# Patient Record
Sex: Male | Born: 1950 | Race: White | Hispanic: No | Smoking: Former smoker
Health system: Southern US, Community
[De-identification: ages and names within clinical notes are randomized; demographics above are authoritative.]

## PROBLEM LIST (undated history)

## (undated) DIAGNOSIS — M549 Dorsalgia, unspecified: Secondary | ICD-10-CM

## (undated) DIAGNOSIS — Z87442 Personal history of urinary calculi: Secondary | ICD-10-CM

## (undated) DIAGNOSIS — N200 Calculus of kidney: Secondary | ICD-10-CM

## (undated) DIAGNOSIS — I2510 Atherosclerotic heart disease of native coronary artery without angina pectoris: Secondary | ICD-10-CM

## (undated) DIAGNOSIS — I480 Paroxysmal atrial fibrillation: Secondary | ICD-10-CM

## (undated) DIAGNOSIS — I959 Hypotension, unspecified: Secondary | ICD-10-CM

## (undated) DIAGNOSIS — R55 Syncope and collapse: Secondary | ICD-10-CM

## (undated) DIAGNOSIS — F419 Anxiety disorder, unspecified: Secondary | ICD-10-CM

## (undated) HISTORY — PX: LITHOTRIPSY: SUR834

## (undated) HISTORY — DX: Paroxysmal atrial fibrillation: I48.0

## (undated) HISTORY — DX: Hypotension, unspecified: I95.9

## (undated) HISTORY — PX: OTHER SURGICAL HISTORY: SHX169

## (undated) HISTORY — DX: Syncope and collapse: R55

## (undated) HISTORY — PX: CARDIAC CATHETERIZATION: SHX172

---

## 2004-11-19 ENCOUNTER — Ambulatory Visit: Payer: Self-pay | Admitting: Family Medicine

## 2005-07-11 ENCOUNTER — Ambulatory Visit: Payer: Self-pay | Admitting: Family Medicine

## 2005-07-25 ENCOUNTER — Ambulatory Visit: Payer: Self-pay | Admitting: Family Medicine

## 2005-08-14 ENCOUNTER — Ambulatory Visit (HOSPITAL_COMMUNITY): Payer: Self-pay | Admitting: Psychiatry

## 2005-09-04 ENCOUNTER — Ambulatory Visit: Payer: Self-pay | Admitting: Family Medicine

## 2005-09-04 ENCOUNTER — Ambulatory Visit (HOSPITAL_COMMUNITY): Payer: Self-pay | Admitting: Psychiatry

## 2005-09-25 ENCOUNTER — Ambulatory Visit (HOSPITAL_COMMUNITY): Payer: Self-pay | Admitting: Psychiatry

## 2005-11-01 ENCOUNTER — Ambulatory Visit (HOSPITAL_COMMUNITY): Payer: Self-pay | Admitting: Psychiatry

## 2005-12-02 ENCOUNTER — Ambulatory Visit (HOSPITAL_COMMUNITY): Payer: Self-pay | Admitting: Psychiatry

## 2005-12-02 ENCOUNTER — Ambulatory Visit: Payer: Self-pay | Admitting: Family Medicine

## 2005-12-09 ENCOUNTER — Ambulatory Visit: Payer: Self-pay | Admitting: Family Medicine

## 2005-12-30 ENCOUNTER — Ambulatory Visit (HOSPITAL_COMMUNITY): Payer: Self-pay | Admitting: Psychiatry

## 2006-01-15 ENCOUNTER — Inpatient Hospital Stay (HOSPITAL_COMMUNITY): Admission: EM | Admit: 2006-01-15 | Discharge: 2006-01-22 | Payer: Self-pay | Admitting: Emergency Medicine

## 2006-01-15 ENCOUNTER — Ambulatory Visit: Payer: Self-pay | Admitting: Family Medicine

## 2006-01-15 ENCOUNTER — Ambulatory Visit: Payer: Self-pay | Admitting: Internal Medicine

## 2006-01-15 ENCOUNTER — Ambulatory Visit (HOSPITAL_COMMUNITY): Payer: Self-pay | Admitting: Psychiatry

## 2006-01-28 ENCOUNTER — Ambulatory Visit: Payer: Self-pay | Admitting: Family Medicine

## 2006-02-03 ENCOUNTER — Ambulatory Visit (HOSPITAL_COMMUNITY): Payer: Self-pay | Admitting: Psychiatry

## 2006-02-06 ENCOUNTER — Ambulatory Visit: Payer: Self-pay | Admitting: Family Medicine

## 2006-02-10 ENCOUNTER — Ambulatory Visit: Payer: Self-pay

## 2006-02-13 ENCOUNTER — Ambulatory Visit: Payer: Self-pay | Admitting: Family Medicine

## 2006-02-14 ENCOUNTER — Ambulatory Visit: Payer: Self-pay | Admitting: Pulmonary Disease

## 2006-02-24 ENCOUNTER — Ambulatory Visit (HOSPITAL_COMMUNITY): Payer: Self-pay | Admitting: Psychiatry

## 2006-02-27 ENCOUNTER — Ambulatory Visit: Payer: Self-pay | Admitting: Internal Medicine

## 2006-02-27 ENCOUNTER — Ambulatory Visit: Payer: Self-pay

## 2006-02-27 ENCOUNTER — Encounter: Payer: Self-pay | Admitting: Internal Medicine

## 2006-03-28 ENCOUNTER — Ambulatory Visit: Payer: Self-pay | Admitting: Gastroenterology

## 2006-03-31 ENCOUNTER — Encounter (INDEPENDENT_AMBULATORY_CARE_PROVIDER_SITE_OTHER): Payer: Self-pay | Admitting: Specialist

## 2006-03-31 ENCOUNTER — Ambulatory Visit: Payer: Self-pay | Admitting: Gastroenterology

## 2006-04-28 ENCOUNTER — Ambulatory Visit (HOSPITAL_COMMUNITY): Payer: Self-pay | Admitting: Psychiatry

## 2006-06-20 ENCOUNTER — Ambulatory Visit: Payer: Self-pay | Admitting: Family Medicine

## 2006-06-23 ENCOUNTER — Ambulatory Visit (HOSPITAL_COMMUNITY): Payer: Self-pay | Admitting: Psychiatry

## 2006-11-26 ENCOUNTER — Ambulatory Visit (HOSPITAL_COMMUNITY): Payer: Self-pay | Admitting: Psychiatry

## 2007-01-02 ENCOUNTER — Ambulatory Visit (HOSPITAL_COMMUNITY): Payer: Self-pay | Admitting: Psychiatry

## 2007-01-16 ENCOUNTER — Ambulatory Visit (HOSPITAL_COMMUNITY): Payer: Self-pay | Admitting: Psychiatry

## 2007-01-19 ENCOUNTER — Ambulatory Visit (HOSPITAL_COMMUNITY): Payer: Self-pay | Admitting: Psychiatry

## 2007-02-06 ENCOUNTER — Ambulatory Visit (HOSPITAL_COMMUNITY): Payer: Self-pay | Admitting: Psychiatry

## 2007-03-20 ENCOUNTER — Ambulatory Visit (HOSPITAL_COMMUNITY): Payer: Self-pay | Admitting: Psychiatry

## 2007-04-03 ENCOUNTER — Ambulatory Visit: Payer: Self-pay | Admitting: Family Medicine

## 2007-04-03 LAB — CONVERTED CEMR LAB
AST: 24 units/L (ref 0–37)
Alkaline Phosphatase: 70 units/L (ref 39–117)
Calcium: 10 mg/dL (ref 8.4–10.5)
Chloride: 100 meq/L (ref 96–112)
Cholesterol: 315 mg/dL (ref 0–200)
Creatinine, Ser: 1 mg/dL (ref 0.4–1.5)
Eosinophils Absolute: 0.1 10*3/uL (ref 0.0–0.6)
Eosinophils Relative: 1.5 % (ref 0.0–5.0)
GFR calc Af Amer: 99 mL/min
GFR calc non Af Amer: 82 mL/min
Glucose, Bld: 77 mg/dL (ref 70–99)
HDL: 47.3 mg/dL (ref >39.0)
Hemoglobin: 15.6 g/dL (ref 13.0–17.0)
Neutro Abs: 5.1 10*3/uL (ref 1.4–7.7)
Platelets: 261 10*3/uL (ref 150–400)
RDW: 11.9 % (ref 11.5–14.6)
Total Bilirubin: 1.7 mg/dL — ABNORMAL HIGH (ref 0.3–1.2)
Total Protein: 7.1 g/dL (ref 6.0–8.3)
Triglycerides: 52 mg/dL (ref 0–149)
WBC: 7.7 10*3/uL (ref 4.5–10.5)

## 2007-04-06 ENCOUNTER — Emergency Department (HOSPITAL_COMMUNITY): Admission: EM | Admit: 2007-04-06 | Discharge: 2007-04-07 | Payer: Self-pay | Admitting: Emergency Medicine

## 2007-04-13 ENCOUNTER — Ambulatory Visit: Payer: Self-pay | Admitting: Family Medicine

## 2007-04-21 DIAGNOSIS — E785 Hyperlipidemia, unspecified: Secondary | ICD-10-CM | POA: Insufficient documentation

## 2007-04-21 DIAGNOSIS — R51 Headache: Secondary | ICD-10-CM

## 2007-04-21 DIAGNOSIS — F329 Major depressive disorder, single episode, unspecified: Secondary | ICD-10-CM

## 2007-04-21 DIAGNOSIS — R519 Headache, unspecified: Secondary | ICD-10-CM | POA: Insufficient documentation

## 2007-04-21 DIAGNOSIS — F411 Generalized anxiety disorder: Secondary | ICD-10-CM | POA: Insufficient documentation

## 2007-05-01 ENCOUNTER — Ambulatory Visit (HOSPITAL_COMMUNITY): Payer: Self-pay | Admitting: Psychiatry

## 2007-06-01 ENCOUNTER — Ambulatory Visit (HOSPITAL_COMMUNITY): Payer: Self-pay | Admitting: Psychiatry

## 2007-07-06 ENCOUNTER — Ambulatory Visit (HOSPITAL_COMMUNITY): Payer: Self-pay | Admitting: Psychiatry

## 2007-08-24 ENCOUNTER — Ambulatory Visit (HOSPITAL_COMMUNITY): Payer: Self-pay | Admitting: Psychiatry

## 2007-10-19 ENCOUNTER — Ambulatory Visit (HOSPITAL_COMMUNITY): Payer: Self-pay | Admitting: Psychiatry

## 2008-01-11 ENCOUNTER — Ambulatory Visit (HOSPITAL_COMMUNITY): Payer: Self-pay | Admitting: Psychiatry

## 2008-04-11 ENCOUNTER — Ambulatory Visit (HOSPITAL_COMMUNITY): Payer: Self-pay | Admitting: Psychiatry

## 2008-07-18 ENCOUNTER — Ambulatory Visit (HOSPITAL_COMMUNITY): Payer: Self-pay | Admitting: Psychiatry

## 2008-09-21 ENCOUNTER — Emergency Department (HOSPITAL_COMMUNITY): Admission: EM | Admit: 2008-09-21 | Discharge: 2008-09-21 | Payer: Self-pay | Admitting: Podiatry

## 2009-02-03 ENCOUNTER — Encounter (INDEPENDENT_AMBULATORY_CARE_PROVIDER_SITE_OTHER): Payer: Self-pay | Admitting: *Deleted

## 2010-11-17 ENCOUNTER — Emergency Department (HOSPITAL_COMMUNITY): Payer: Medicare Other

## 2010-11-17 ENCOUNTER — Emergency Department (HOSPITAL_COMMUNITY)
Admission: EM | Admit: 2010-11-17 | Discharge: 2010-11-17 | Disposition: A | Discharge status: Home / Self Care | Payer: Medicare Other | Attending: Emergency Medicine | Admitting: Emergency Medicine

## 2010-11-17 DIAGNOSIS — F341 Dysthymic disorder: Secondary | ICD-10-CM | POA: Insufficient documentation

## 2010-11-17 DIAGNOSIS — Z79899 Other long term (current) drug therapy: Secondary | ICD-10-CM | POA: Insufficient documentation

## 2010-11-17 DIAGNOSIS — N201 Calculus of ureter: Secondary | ICD-10-CM | POA: Insufficient documentation

## 2010-11-17 DIAGNOSIS — R109 Unspecified abdominal pain: Secondary | ICD-10-CM | POA: Insufficient documentation

## 2010-11-17 LAB — BASIC METABOLIC PANEL
BUN: 14 mg/dL (ref 6–23)
Calcium: 9.2 mg/dL (ref 8.4–10.5)
Chloride: 106 mEq/L (ref 96–112)
Creatinine, Ser: 1.2 mg/dL (ref 0.4–1.5)
GFR calc non Af Amer: >60 mL/min (ref >60)
Glucose, Bld: 78 mg/dL (ref 70–99)
Potassium: 3.4 mEq/L — ABNORMAL LOW (ref 3.5–5.1)

## 2011-02-19 NOTE — Assessment & Plan Note (Signed)
[facility] OFFICE NOTE   Jeremy King, Jeremy King                       MRN:          [id number]  DATE:04/13/2007                            DOB:          03/12/1951    This is a 60 year old gentleman here for a complete physical  examination.  In general, he is doing well.  His eczema has acted up  again on his lower legs.  He has been using the over-the-counter  cortisone cream, but says it is not working very well.  He is just  finishing a course of Cipro for a prostate infection.  This was  asymptomatic, and he continues to have no symptoms at all.  He continues  to see Dr. Lolly Mustache on a regular basis for depression and anxiety.  He had  an unremarkable colonoscopy in June 2007.   Further details of his past medical history, family history, social  history, habits, etc., refer to our last physical note dated December 11, 2005.   ALLERGIES:  ZOLOFT.   CURRENT MEDICATIONS:  1. Effexor XR a total of 225 mg a day.  2. Multivitamins daily.  3. Fish oil daily.  4. Trazodone 100 mg nightly.  5. Xanax 1 mg t.i.d.   OBJECTIVE:  Weight 177.  BP 98/64.  Pulse 80 and regular.  GENERAL:  He appears to be doing well.  SKIN:  Exam is remarkable for several small erythematous scaly patches  of skin on his legs.  EYES clear.  EARS clear.  PHARYNX clear.  NECK:  Supple without lymphadenopathy or masses.  LUNGS:  Clear.  NECK:  Supple without lymphadenopathy or masses.  CARDIAC:  Rate and rhythm regular without gallops, murmurs, or rubs.  Distal pulses full.  EKG:  Shows sinus rhythm with a right bundle branch block as usual.  ABDOMEN:  Soft.  Normal bowel sounds.  Nontender.  No masses.  GENITALIA:  Normal male.  RECTAL EXAM:  No masses or tenderness.  Prostate is within normal  limits.  It is nontender.  Stool is Hemoccult negative.  EXTREMITIES:  No clubbing, cyanosis, or edema.  NEUROLOGIC:  Exam is grossly intact.   He was here for fasting labs on June 27th.  These were all within normal  limits except for his lipid panel.  Total cholesterol was high at 315.  HDL was adequate at 47, but LDL was quite high at 248.  PSA was normal  at 3.01.   ASSESSMENT AND PLAN:  1. Complete physical exam.  We discussed getting more regular      exercise.  2. Hyperlipidemia.  We will get back on Zocor 40 mg once a day.  We      will recheck his blood work in 6 months.  3. Eczema.  Diprolene AF cream to apply b.i.d. as needed.  4. Depression and anxiety.  He will follow up with Dr. Lolly Mustache.  5. Recent prostatitis.  He will finish out his current course of      Cipro.     Tera Mater. Clent Ridges, MD  Electronically Signed  SAF/MedQ  DD: 04/14/2007  DT: 04/14/2007  Job #: [id number]

## 2011-02-22 NOTE — Op Note (Signed)
NAMEDARTANYON, FRANKOWSKI                ACCOUNT NO.:  [id number]   MEDICAL RECORD NO.:  [id number]          PATIENT TYPE:  INP   LOCATION:  1313                         FACILITY:  [facility]   PHYSICIAN:  Georgina Quint. Plotnikov, M.D. LHCDATE OF BIRTH:  11/26/1950   DATE OF PROCEDURE:  01/21/2006  DATE OF DISCHARGE:                                 OPERATIVE REPORT   PROCEDURE:  Laceration repair.   INDICATIONS:  Open wound, 6 cm, right arm, status post fall.   Risks including infection, scar formation, bleeding and others, were  explained to the patient in detail and he agreed to proceed.  A 6 cm  laceration with a 4 cm gap in the middle was prepped with Betadine and  alcohol and cleaned after injection from the inside edges with 10 mL of 2%  lidocaine with epinephrine.  Six staples were applied with approximation of  the wound edges.  Antibiotic ointment applied, Telfa pad with dressing.  Tolerated it well.   COMPLICATIONS:  None.   FOLLOW-UP:  Change Telfa pad with antibiotic ointment daily.  Remove staples  in about 12 days.           ______________________________  Georgina Quint Plotnikov, M.D. [facility]     AVP/MEDQ  D:  01/21/2006  T:  01/22/2006  Job:  [id number]

## 2011-02-22 NOTE — H&P (Signed)
Jeremy King, Jeremy King                ACCOUNT NO.:  [id number]   MEDICAL RECORD NO.:  [id number]          PATIENT TYPE:  INP   LOCATION:  0103                         FACILITY:  [facility]   PHYSICIAN:  Valetta Mole. Swords, M.D. [facility] OF BIRTH:  [birth year]   DATE OF ADMISSION:  01/15/2006  DATE OF DISCHARGE:                                HISTORY & PHYSICAL   CHIEF COMPLAINT:  Altered mental status.   HISTORY OF PRESENT ILLNESS:  Jeremy King (date of birth [birth year]) is a 60 year old male with a long history of anxiety and depression,  who had a three to four day  history of altered mental status associated  with hallucination, mostly visual, and also associated with increased  fatigue and lifelessness. He apparently was seen on admission today with  altered mental status, thought it was psychiatric in nature, saw his  psychiatrist shortly thereafter. The psychiatrist thought Jeremy King was  more ill than could be explained by psychiatric illness and was sent to the  emergency room for evaluation. Emergency department evaluation has revealed  the patient likely has pneumonia and he is admitted for further evaluation  and treatment. On further questioning Jeremy King does admit to some  shortness of breath that he says is typical of his panic attacks and felt  that his shortness of breath was related to his panic attacks. He has  chronic wheezing and a chronic cough not significantly changed. He has had  some chills, but nothing of significance and he denies any known fever or  sweats. He does admit to decreased appetite, increased panic, increased  crying. His wife states that Jeremy King appears to be more unsteady on his  feet.   REVIEW OF SYSTEMS:  Increased panic, increased emotional displays, decreased  appetite. It is worth noted that three weeks ago that the patient had some  lower extremity edema, cause unknown and was treated with Lasix, and is  using Lasix p.r.n. with  relative control of his edema.   PAST MEDICAL HISTORY:  Panic and depression. He also has hyperlipidemia.   PAST SURGICAL HISTORY:  Rotator cuff.   CURRENT MEDICATIONS:  1.  Trazodone 75 mg q.h.s.  2.  Zocor 40 mg p.o. daily.  3.  Lasix 20 mg p.o. daily p.r.n.  4.  Effexor XR 150mg  p.o. daily.   ALLERGIES:  ZOLOFT.   SOCIAL HISTORY:  He smokes two packs per day. He is married, lives with his  wife, does not drink alcohol.   FAMILY HISTORY:  Mother deceased with what sounds congestive heart failure  and valvular disorder at 14. Father deceased with myocardial infarction in  his early 58s.   REVIEW OF SYSTEMS:  As above. No other complaints on a complete review of  systems.   PHYSICAL EXAMINATION:  VITAL SIGNS: On examination temperature 100.2, pulse  91, respirations 19, blood pressure 106/61.  GENERAL: He appears as a disheveled male in no acute distress.  HEENT: Normocephalic and atraumatic. Extraocular muscles are intact.  Oropharynx is pink, somewhat dry.  NECK: Supple without lymphadenopathy, thyromegaly, jugular venous  distention, or carotid bruits.  CHEST: Decreased breath sounds throughout with inspiratory and expiratory  wheezing. Dullness to percussion at both bases. Wheezing is heard anteriorly  and posteriorly.  CARDIAC: S1 and S2 regular. A 2/6 holosystolic murmur.  ABDOMEN: Active bowel sounds, soft, and nontender. There is no  hepatosplenomegaly. No masses palpated.  EXTREMITIES: There is no clubbing, cyanosis. There is trace pretibial edema.  NEUROLOGIC: He is alert and oriented, and moves all four extremities without  difficulty.  RECTAL EXAM: Not performed.   LABORATORY DATA:  Laboratories demonstrate a white count of 10.8, hemoglobin  12.0, platelet count 183,000. Alcohol level less than 5.  Urine drug screen  unremarkable. Blood gases reveal pH of 7.43, PCO2 35, PO2 44.6, BNP mildly  elevated at 201.  Blood cultures drawn.   Chest x-ray demonstrates  bilateral airspace disease. EKG demonstrates normal  sinus rhythm with an incomplete right bundle branch block.   ASSESSMENT:  1.  Community-acquired pneumonia.  2.  Hypoxic respiratory failure; suspect most of this chronic.  3.  Hypokalemia.  4.  Depression.  5.  Hyperlipidemia.  6.  Subjective history of edema which seems to be resolved.   PLAN:  1.  The patient needs antibiotic coverage. Will treat with Rocephin and      Zithromax. We will also treat with steroids given much bronchospasm.      Also will treat with nebulizers and oxygen. His story is somewhat odd      for community-acquired pneumonia and so some suspicion for other causes      of airspace disease should be considered if he does not respond as      typical for community-acquired pneumonia.  2.  Respiratory failure. Will use oxygen. Discontinue smoking.  3.  Nicotine addiction. Will replace with nicotine patch.  4.  Depression and anxiety. Continue his current medications.  5.  Hyperlipidemia. Continue Zocor.  6.  Subjective edema. I am really unsure what this is related to. I am      unclear as to why Lasix was prescribed. Clearly the edema is better. I      do wonder whether some how this history of edema and bilateral airspace      disease are somehow related to an inflammatory process. I will check a      sedimentation rate. If it is markedly elevated he may need some further      evaluation for an inflammatory pulmonary disease if he does not respond      to antibiotics for community-acquired pneumonia.      Bruce Rexene Edison Swords, M.D. [facility]  Electronically Signed     BHS/MEDQ  D:  01/15/2006  T:  01/15/2006  Job:  [id number]   cc:   Jeannett Senior A. Clent Ridges, M.D. [facility]  [address] [place]  [place] [address]

## 2011-02-22 NOTE — Consult Note (Signed)
[name], Jeremy King                ACCOUNT NO.:  [id number]   MEDICAL RECORD NO.:  [id number]          PATIENT TYPE:  INP   LOCATION:  1313                         FACILITY:  [facility]   PHYSICIAN:  Georgina Quint. Plotnikov, M.D. LHCDATE OF BIRTH:  05-Feb-1951   DATE OF CONSULTATION:  DATE OF DISCHARGE:                                   CONSULTATION   Audio too short to transcribe (less than 5 seconds)           ______________________________  Georgina Quint. Plotnikov, M.D. [facility]     AVP/MEDQ  D:  01/21/2006  T:  01/21/2006  Job:  [id number]

## 2011-02-22 NOTE — Discharge Summary (Signed)
Jeremy King, Jeremy King                ACCOUNT NO.:  [id number]   MEDICAL RECORD NO.:  [id number]          PATIENT TYPE:  INP   LOCATION:  1313                         [facility]ACILITY:  [facility]   PHYSICIAN:  Rene Paci, M.D. LHCDATE OF BIRTH:  [birth year]   DATE OF ADMISSION:  01/15/2006  DATE OF DISCHARGE:  01/22/2006                                 DISCHARGE SUMMARY   DISCHARGE DIAGNOSIS:  1.  Acute hypoxic respiratory failure secondary to presumed bilateral      atypical pneumonia, improving.  Continue home O2, antibiotics with close      outpatient followup.  2.  Steroid-induced hyperglycemia. Has been on sliding-scale insulin during      this hospitalization. Normal A1c. Expect resolution once off prednisone  3.  Tobacco abuse prior to admission.  Recommend continued cessation. Has      been on nicotine patch during this hospitalization.  4.  History of anxiety and depression.  Continue Effexor with outpatient      behavioral health followup as needed.  5.  Hypokalemia at time of admission, presumed secondary to diuretic Lasix      use p.r.n., resolved.  6.  Accidental fall with right arm 6 cm laceration status post staples April      17, for removal in 10-14 days with outpatient MD.   DISCHARGE MEDICATIONS:  1.  Avelox 400 mg daily times an additional 7 days.  The patient has been      treated with 7 days of IV Rocephin and azithromycin during this      hospitalization.  2.  Prednisone taper 30 mg x2 days, then 20 mg x2 days, then 10 mg x2 days,      then stop.  3.  Protonix 40 mg p.o. twice daily.  4.  The patient will continue to use Effexor XR 150 mg daily.  5.  Zocor 40 mg once nightly as prior to admission.   DISPOSITION:  The patient is being discharged home with arrangements made  for home oxygen 2 liters continuous at all times.  He has been weaned down  to 93% saturation on 1-2 liters and is not actively dyspneic or coughing at  this time. He is anxious for discharge  home, and, as he has remained stable  without decline only slow improvement, is felt stable to transition his  treatment an outpatient basis with close outpatient follow-up primary MD.  Hospital followup has been scheduled with Dr. Clent Ridges, his primary MD, next week  on Wednesday, April 25, at 11:15 a.m. The patient is instructed to return  the emergency room or call his MD sooner if fevers, shortness of breath, or  coughing, or pain should recur once home.   CONSULTATIONS THIS HOSPITALIZATION:  Include Dr. Sandrea Hughs with [facility].  Please see his consultation on April 12.   CONDITION ON DISCHARGE:  Medically improved and stable for continued  outpatient treatment of bilateral pneumonia.   HOSPITAL COURSE BY PROBLEM:  #1 HYPOXIC RESPIRATORY FAILURE:  The patient is a pleasant 60 year old  gentleman basically healthy with history of psychiatric depression and  anxiety who was brought to the emergency room the day of admission for  evaluation by behavioral health due to hallucinations and delusions. On  further evaluation by behavioral health, it was felt he was more medically  ill than could be explained by mere psychiatric illness, and on further  medical workup, he was found to be significantly hypoxic, prompting an x-ray  which showed bilateral diffuse interstitial type disease and infiltration  presumably due to community-acquired pneumonia. The patient also is a  tobacco user without history of COPD. It is unclear what his baseline O2  status has been. He was admitted for IV antibiotic therapy as he did have a  white count of 10.8 despite lack of other typical symptoms such as fever,  cough, sputum, shortness of breath, or pleurisy. Because of the markedly  elevated sed rate at 94 and possibility of there being other etiologies for  his bilateral disease, a pulmonary consult was obtained.  This was done by  Dr. Sherene Sires on April 12 who agreed that atypical pneumonia was most  likely  versus other eosinophilic pneumonia versus GOOP versus alveolar hemorrhage.  Urine antigen for strep pneumo and Legionella was checked negative.  He was  treated empirically with steroids IV Solu-Medrol for a duration, then slowly  changed to p.o. prednisone taper which he is still continuing at this time.  Also agreed with empiric continued treatment with IV Rocephin and  azithromycin over the course of the week.  The patient slowly improved, his  hypoxemia moving from 92% on 5 liters at time of admission to 92% on 1 liter  on the day of discharge.  The patient is anxious for discharge home but  still requiring oxygen.  It is felt that he should continue nasal cannula O2  for the next week while on  antibiotics. Continuing tobacco cessation and  rest at home with close outpatient followup with primary MD is scheduled for  next week.  Should there be further questions or problems, the patient is to  return to the emergency room more or less urgent basis. Outpatient referral  back to pulmonary for other evaluation to be considered. The patient is  reminded to continue with tobacco cessation as he has for this time in the  hospital , and he agrees he will try.   #2.  ACCIDENTAL FALL:  On the day prior to discharge while standing to  urinate in the restroom, the patient states he became tangled in his O2  tank, his surroundings, felt weak, and fell.  Denies syncope or loss of  consciousness. He did hit his head, and head CT was negative for any  intracranial hemorrhage or damage.  Scalp remained intact; however, he did  suffer a laceration to his right arm approximately 6 cm in length which  required staples. The patient is instructed to change his pad and antibiotic  ointment daily and remove staples in about 12 days. Defer to primary MD to  follow up on this.   #3.  HYPERGLYCEMIA:  While on high doses of steroids, the patient developed hyperglycemia. He was treated with  sliding-scale insulin and low doses of  Lantus this hospitalization. A1c was normal, and it is not believed the  patient has diabetes. These should resolve once off steroids. Outpatient  follow-up with primary MD.   #4.  PSYCHIATRIC ISSUES:  As stated above, the patient's delusions, for  which he initially sought attention, were felt to be due to hypoxemia and  acute  illness.  He has not had any other problems with this during this  hospitalization. He was continued on his home Effexor plus p.r.n. Xanax  during this hospitalization, and further outpatient followup with  psychiatric behavioral health can be pursued on an as-needed basis.   #5.  OTHER MEDICAL ISSUES:  The patient's other medical issues are as listed  above.  No change were made in his regimen except as listed.      Rene Paci, M.D. [facility]  Electronically Signed     VL/MEDQ  D:  01/22/2006  T:  01/22/2006  Job:  [id number]

## 2011-07-12 LAB — URINE MICROSCOPIC-ADD ON

## 2011-07-12 LAB — DIFFERENTIAL
Basophils Absolute: 0 10*3/uL (ref 0.0–0.1)
Basophils Relative: 0 % (ref 0–1)
Eosinophils Absolute: 0 10*3/uL (ref 0.0–0.7)
Lymphs Abs: 0.8 10*3/uL (ref 0.7–4.0)

## 2011-07-12 LAB — CBC
HCT: 46.3 % (ref 39.0–52.0)
Hemoglobin: 15.8 g/dL (ref 13.0–17.0)
MCHC: 34.2 g/dL (ref 30.0–36.0)
Platelets: 225 10*3/uL (ref 150–400)
WBC: 13.6 10*3/uL — ABNORMAL HIGH (ref 4.0–10.5)

## 2011-07-12 LAB — POCT I-STAT, CHEM 8
Calcium, Ion: 1.12 mmol/L (ref 1.12–1.32)
Creatinine, Ser: 1.2 mg/dL (ref 0.4–1.5)
Glucose, Bld: 130 mg/dL — ABNORMAL HIGH (ref 70–99)
HCT: 46 % (ref 39.0–52.0)
Sodium: 140 mEq/L (ref 135–145)

## 2011-07-12 LAB — URINALYSIS, ROUTINE W REFLEX MICROSCOPIC
Bilirubin Urine: NEGATIVE
Leukocytes, UA: NEGATIVE
Nitrite: NEGATIVE
Specific Gravity, Urine: 1.017 (ref 1.005–1.030)
Urobilinogen, UA: 0.2 mg/dL (ref 0.0–1.0)
pH: 5.5 (ref 5.0–8.0)

## 2012-06-26 ENCOUNTER — Encounter: Payer: Self-pay | Admitting: Gastroenterology

## 2013-06-10 ENCOUNTER — Emergency Department (HOSPITAL_COMMUNITY)
Admission: EM | Admit: 2013-06-10 | Discharge: 2013-06-10 | Disposition: A | Discharge status: Home / Self Care | Payer: Medicare Other | Attending: Emergency Medicine | Admitting: Emergency Medicine

## 2013-06-10 ENCOUNTER — Encounter (HOSPITAL_COMMUNITY): Payer: Self-pay | Admitting: Emergency Medicine

## 2013-06-10 DIAGNOSIS — R6889 Other general symptoms and signs: Secondary | ICD-10-CM | POA: Insufficient documentation

## 2013-06-10 DIAGNOSIS — Z8679 Personal history of other diseases of the circulatory system: Secondary | ICD-10-CM | POA: Insufficient documentation

## 2013-06-10 DIAGNOSIS — F17200 Nicotine dependence, unspecified, uncomplicated: Secondary | ICD-10-CM | POA: Insufficient documentation

## 2013-06-10 DIAGNOSIS — Y939 Activity, unspecified: Secondary | ICD-10-CM | POA: Insufficient documentation

## 2013-06-10 DIAGNOSIS — Y929 Unspecified place or not applicable: Secondary | ICD-10-CM | POA: Insufficient documentation

## 2013-06-10 DIAGNOSIS — H409 Unspecified glaucoma: Secondary | ICD-10-CM | POA: Insufficient documentation

## 2013-06-10 DIAGNOSIS — H579 Unspecified disorder of eye and adnexa: Secondary | ICD-10-CM

## 2013-06-10 DIAGNOSIS — H40053 Ocular hypertension, bilateral: Secondary | ICD-10-CM

## 2013-06-10 DIAGNOSIS — T1590XA Foreign body on external eye, part unspecified, unspecified eye, initial encounter: Secondary | ICD-10-CM | POA: Insufficient documentation

## 2013-06-10 DIAGNOSIS — Z87442 Personal history of urinary calculi: Secondary | ICD-10-CM | POA: Insufficient documentation

## 2013-06-10 HISTORY — DX: Calculus of kidney: N20.0

## 2013-06-10 HISTORY — DX: Dorsalgia, unspecified: M54.9

## 2013-06-10 MED ORDER — ERYTHROMYCIN 5 MG/GM OP OINT: TOPICAL_OINTMENT | Freq: Four times a day (QID) | OPHTHALMIC | Status: DC

## 2013-06-10 MED ORDER — TETRACAINE HCL 0.5 % OP SOLN
2.0000 [drp] | Freq: Once | OPHTHALMIC | Status: AC
  Administered 2013-06-10: 2 [drp] via OPHTHALMIC
  Filled 2013-06-10: qty 2

## 2013-06-10 NOTE — ED Notes (Signed)
Redness and pain in r/eye for one month. Unresponsive to eye drops

## 2013-06-10 NOTE — ED Notes (Signed)
Corrected with glasses

## 2013-06-10 NOTE — ED Provider Notes (Signed)
CSN: [id number]     Arrival date & time 06/10/13  1426 History  This chart was scribed for non-physician practitioner working with Jeremy King. Oletta Lamas, MD by Ashley Jacobs, ED scribe. This patient was seen in room WTR8/WTR8 and the patient's care was started at 4:24 PM    Chief Complaint  Patient presents with  . Foreign Body in Eye   Patient is a 62 y.o. male presenting with foreign body in eye. The history is provided by the patient and medical records. No language interpreter was used.  Foreign Body in Eye This is a recurrent problem. The current episode started more than 1 week ago. The problem occurs constantly. The problem has been gradually worsening. Nothing aggravates the symptoms. Nothing relieves the symptoms.  Foreign Body in Eye This is a recurrent problem. The current episode started more than 1 week ago. The problem occurs constantly. The problem has been gradually worsening. Pertinent negatives include no chills or fever. Nothing aggravates the symptoms.   HPI Comments: Jeremy King is a 62 y.o. male who presents to the Emergency Department complaining of foreign body in right eye with onset of one month ago which has been progressively worsening. He is here today because he can no longer deal with the irritation. Pt explains that it his eye is irritating and mildly painful. He denies photophobia, painful occular moments and visual disturbances. He also reports having a previous episode of having a foreign object lodged in his eye however he states that this time the pain is different. Nothing seems to relieve his symptoms.  He does not wear contacts. No fevers, chills, headaches, blurry vision, double vision, eye redness, eye discharge. Pt is allergic to Sertraline HCL.  Past Medical History  Diagnosis Date  . Kidney stones   . Atrial fibrillation   . Back ache    Past Surgical History  Procedure Laterality Date  . Lithotripsy     Family History  Problem Relation Age of  Onset  . Heart failure Mother   . Diabetes Father   . Heart failure Father   . Hypertension Father    History  Substance Use Topics  . Smoking status: Current Every Day Smoker    Types: Cigarettes  . Smokeless tobacco: Not on file  . Alcohol Use: No    Review of Systems  Constitutional: Negative for fever and chills.  Eyes: Positive for pain. Negative for photophobia, discharge, redness, itching and visual disturbance.  All other systems reviewed and are negative.    Allergies  Sertraline hcl  Home Medications  No current outpatient prescriptions on file. BP 100/65  Pulse 98  Temp(Src) 98.3 F (36.8 C) (Oral)  Resp 18  SpO2 99% Physical Exam  Nursing note and vitals reviewed. Constitutional: He is oriented to person, place, and time. He appears well-developed and well-nourished. No distress.  HENT:  Head: Normocephalic and atraumatic.  Right Ear: External ear normal.  Left Ear: External ear normal.  Nose: Nose normal.  Eyes: Conjunctivae, EOM and lids are normal. Pupils are equal, round, and reactive to light. Right eye exhibits no discharge. Left eye exhibits no discharge. No scleral icterus.  Slit lamp exam:      The right eye shows no corneal abrasion, no corneal flare, no corneal ulcer, no foreign body, no hyphema and no hypopyon.       The left eye shows no fluorescein uptake.  No photophobia No foreign body visualized  no visual disturbances No abrasion  No  lesions tononpen 35 in right eye, 38 in left eye  Neck: Normal range of motion. No tracheal deviation present.  Cardiovascular: Normal rate, regular rhythm and normal heart sounds.   Pulmonary/Chest: Effort normal and breath sounds normal. No stridor.  Abdominal: Soft. He exhibits no distension. There is no tenderness.  Musculoskeletal: Normal range of motion.  Neurological: He is alert and oriented to person, place, and time.  Skin: Skin is warm and dry. He is not diaphoretic.  Psychiatric: He has a  normal mood and affect. His behavior is normal.    ED Course  Procedures (including critical care time) DIAGNOSTIC STUDIES: Oxygen Saturation is 99% on room air, normal by my interpretation.    COORDINATION OF CARE: 4:28 PM Discussed course of care with pt which includes visual erstands and agrees.  Labs Reviewed - No data to display Imaging Review No results found.  MDM   1. Sensation of foreign body in eye   2. Intraocular pressure increase, bilateral    Patient presents with foreign body sensation in right eye. On fluorescein exam there was no uptake. Eye was inverted and swept and no foreign body was visualized. No concern for corneal abrasion, ulceration. Tono-pen revealed high IOP bilaterally. I suspect this is likely chronic in nature. He was given an opthalmology follow up. He will call and make an appointment for as soon as possible. I discussed this case with Dr. Oletta Lamas who agrees with plan. Return instructions given. Vital signs stable for discharge. Patient / Family / Caregiver informed of clinical course, understand medical decision-making process, and agree with plan.   I personally performed the services described in this documentation, which was scribed in my presence. The recorded information has been reviewed and is accurate.     Mora Bellman, PA-C 06/10/13 1918

## 2013-06-10 NOTE — ED Notes (Signed)
Pt states that he has had something in his eye for the past month now.

## 2013-06-11 NOTE — ED Provider Notes (Signed)
Medical screening examination/treatment/procedure(s) were performed by non-physician practitioner and as supervising physician I was immediately available for consultation/collaboration.  Priti Consoli Y. Keera Altidor, MD 06/11/13 0014 

## 2014-04-29 ENCOUNTER — Other Ambulatory Visit (HOSPITAL_COMMUNITY): Payer: Self-pay | Admitting: Psychiatry

## 2014-11-11 DIAGNOSIS — M25562 Pain in left knee: Secondary | ICD-10-CM | POA: Diagnosis not present

## 2014-11-18 DIAGNOSIS — M25562 Pain in left knee: Secondary | ICD-10-CM | POA: Diagnosis not present

## 2014-11-18 DIAGNOSIS — G8929 Other chronic pain: Secondary | ICD-10-CM | POA: Diagnosis not present

## 2014-11-25 DIAGNOSIS — M23322 Other meniscus derangements, posterior horn of medial meniscus, left knee: Secondary | ICD-10-CM | POA: Diagnosis not present

## 2014-11-26 ENCOUNTER — Emergency Department (HOSPITAL_COMMUNITY): Payer: Medicare Other

## 2014-11-26 ENCOUNTER — Encounter (HOSPITAL_COMMUNITY): Payer: Self-pay

## 2014-11-26 ENCOUNTER — Observation Stay (HOSPITAL_COMMUNITY)
Admission: EM | Admit: 2014-11-26 | Discharge: 2014-11-27 | Disposition: A | Discharge status: Home / Self Care | Payer: Medicare Other | Attending: Internal Medicine | Admitting: Internal Medicine

## 2014-11-26 DIAGNOSIS — S4991XA Unspecified injury of right shoulder and upper arm, initial encounter: Secondary | ICD-10-CM | POA: Diagnosis not present

## 2014-11-26 DIAGNOSIS — R55 Syncope and collapse: Principal | ICD-10-CM | POA: Diagnosis present

## 2014-11-26 DIAGNOSIS — I4819 Other persistent atrial fibrillation: Secondary | ICD-10-CM | POA: Diagnosis present

## 2014-11-26 DIAGNOSIS — Z23 Encounter for immunization: Secondary | ICD-10-CM | POA: Diagnosis not present

## 2014-11-26 DIAGNOSIS — I480 Paroxysmal atrial fibrillation: Secondary | ICD-10-CM | POA: Diagnosis not present

## 2014-11-26 DIAGNOSIS — S0003XA Contusion of scalp, initial encounter: Secondary | ICD-10-CM | POA: Insufficient documentation

## 2014-11-26 DIAGNOSIS — I959 Hypotension, unspecified: Secondary | ICD-10-CM | POA: Diagnosis present

## 2014-11-26 DIAGNOSIS — W19XXXA Unspecified fall, initial encounter: Secondary | ICD-10-CM | POA: Insufficient documentation

## 2014-11-26 DIAGNOSIS — M25511 Pain in right shoulder: Secondary | ICD-10-CM | POA: Diagnosis not present

## 2014-11-26 DIAGNOSIS — S40011A Contusion of right shoulder, initial encounter: Secondary | ICD-10-CM | POA: Diagnosis not present

## 2014-11-26 DIAGNOSIS — R52 Pain, unspecified: Secondary | ICD-10-CM | POA: Diagnosis not present

## 2014-11-26 DIAGNOSIS — Y92000 Kitchen of unspecified non-institutional (private) residence as  the place of occurrence of the external cause: Secondary | ICD-10-CM | POA: Diagnosis not present

## 2014-11-26 DIAGNOSIS — I951 Orthostatic hypotension: Secondary | ICD-10-CM

## 2014-11-26 DIAGNOSIS — F17210 Nicotine dependence, cigarettes, uncomplicated: Secondary | ICD-10-CM | POA: Diagnosis not present

## 2014-11-26 DIAGNOSIS — S0990XA Unspecified injury of head, initial encounter: Secondary | ICD-10-CM | POA: Diagnosis not present

## 2014-11-26 LAB — BASIC METABOLIC PANEL
ANION GAP: 7 (ref 5–15)
BUN: 21 mg/dL (ref 6–23)
CHLORIDE: 109 mmol/L (ref 96–112)
CO2: 24 mmol/L (ref 19–32)
Calcium: 9.4 mg/dL (ref 8.4–10.5)
Creatinine, Ser: 1.18 mg/dL (ref 0.50–1.35)
GFR calc Af Amer: 73 mL/min — ABNORMAL LOW (ref >90)
GFR calc non Af Amer: 63 mL/min — ABNORMAL LOW (ref >90)
Glucose, Bld: 104 mg/dL — ABNORMAL HIGH (ref 70–99)
Potassium: 4 mmol/L (ref 3.5–5.1)
Sodium: 140 mmol/L (ref 135–145)

## 2014-11-26 LAB — CBC WITH DIFFERENTIAL/PLATELET
Basophils Absolute: 0 10*3/uL (ref 0.0–0.1)
Basophils Relative: 0 % (ref 0–1)
EOS ABS: 0 10*3/uL (ref 0.0–0.7)
Eosinophils Relative: 0 % (ref 0–5)
HEMATOCRIT: 42.8 % (ref 39.0–52.0)
Hemoglobin: 14.7 g/dL (ref 13.0–17.0)
Lymphocytes Relative: 8 % — ABNORMAL LOW (ref 12–46)
Lymphs Abs: 1 10*3/uL (ref 0.7–4.0)
MCH: 33 pg (ref 26.0–34.0)
MCHC: 34.3 g/dL (ref 30.0–36.0)
MCV: 96.2 fL (ref 78.0–100.0)
MONOS PCT: 5 % (ref 3–12)
Monocytes Absolute: 0.6 10*3/uL (ref 0.1–1.0)
NEUTROS ABS: 10.2 10*3/uL — AB (ref 1.7–7.7)
NEUTROS PCT: 87 % — AB (ref 43–77)
PLATELETS: 225 10*3/uL (ref 150–400)
RBC: 4.45 MIL/uL (ref 4.22–5.81)
RDW: 12.8 % (ref 11.5–15.5)
WBC: 11.8 10*3/uL — ABNORMAL HIGH (ref 4.0–10.5)

## 2014-11-26 MED ORDER — INFLUENZA VAC SPLIT QUAD 0.5 ML IM SUSY
0.5000 mL | PREFILLED_SYRINGE | INTRAMUSCULAR | Status: AC
  Administered 2014-11-27: 0.5 mL via INTRAMUSCULAR
  Filled 2014-11-26 (×2): qty 0.5

## 2014-11-26 MED ORDER — VENLAFAXINE HCL ER 150 MG PO CP24
150.0000 mg | ORAL_CAPSULE | Freq: Every day | ORAL | Status: DC
  Administered 2014-11-27: 150 mg via ORAL
  Filled 2014-11-26 (×3): qty 1

## 2014-11-26 MED ORDER — ONDANSETRON HCL 4 MG/2ML IJ SOLN: 4.0000 mg | Freq: Four times a day (QID) | INTRAMUSCULAR | Status: DC | PRN

## 2014-11-26 MED ORDER — CLONAZEPAM 1 MG PO TABS
1.0000 mg | ORAL_TABLET | Freq: Two times a day (BID) | ORAL | Status: DC
  Administered 2014-11-27 (×2): 1 mg via ORAL
  Filled 2014-11-26 (×2): qty 1

## 2014-11-26 MED ORDER — PNEUMOCOCCAL VAC POLYVALENT 25 MCG/0.5ML IJ INJ
0.5000 mL | INJECTION | INTRAMUSCULAR | Status: AC
  Administered 2014-11-27: 0.5 mL via INTRAMUSCULAR
  Filled 2014-11-26 (×2): qty 0.5

## 2014-11-26 MED ORDER — ONDANSETRON HCL 4 MG PO TABS: 4.0000 mg | ORAL_TABLET | Freq: Four times a day (QID) | ORAL | Status: DC | PRN

## 2014-11-26 MED ORDER — DOCUSATE SODIUM 100 MG PO CAPS
100.0000 mg | ORAL_CAPSULE | Freq: Two times a day (BID) | ORAL | Status: DC
  Administered 2014-11-27: 100 mg via ORAL
  Filled 2014-11-26: qty 1

## 2014-11-26 MED ORDER — TRAZODONE HCL 50 MG PO TABS
100.0000 mg | ORAL_TABLET | Freq: Every day | ORAL | Status: DC
  Administered 2014-11-27: 100 mg via ORAL
  Filled 2014-11-26: qty 2

## 2014-11-26 MED ORDER — ACETAMINOPHEN 650 MG RE SUPP: 650.0000 mg | Freq: Four times a day (QID) | RECTAL | Status: DC | PRN

## 2014-11-26 MED ORDER — HYDROCODONE-ACETAMINOPHEN 5-325 MG PO TABS
1.0000 | ORAL_TABLET | ORAL | Status: DC | PRN
Start: 2014-11-26 — End: 2014-11-27
  Administered 2014-11-27: 2 via ORAL
  Administered 2014-11-27: 1 via ORAL
  Filled 2014-11-26 (×2): qty 2

## 2014-11-26 MED ORDER — SODIUM CHLORIDE 0.9 % IV SOLN
INTRAVENOUS | Status: AC
  Administered 2014-11-27: 01:00:00 via INTRAVENOUS

## 2014-11-26 MED ORDER — ENOXAPARIN SODIUM 40 MG/0.4ML ~~LOC~~ SOLN
40.0000 mg | Freq: Every day | SUBCUTANEOUS | Status: DC
  Administered 2014-11-27: 40 mg via SUBCUTANEOUS
  Filled 2014-11-26: qty 0.4

## 2014-11-26 MED ORDER — HYDROMORPHONE HCL 1 MG/ML IJ SOLN
1.0000 mg | Freq: Once | INTRAMUSCULAR | Status: AC
  Administered 2014-11-26: 1 mg via INTRAVENOUS
  Filled 2014-11-26: qty 1

## 2014-11-26 MED ORDER — ASPIRIN 325 MG PO TABS
325.0000 mg | ORAL_TABLET | Freq: Every day | ORAL | Status: DC
  Administered 2014-11-27: 325 mg via ORAL
  Filled 2014-11-26: qty 1

## 2014-11-26 MED ORDER — SODIUM CHLORIDE 0.9 % IJ SOLN
3.0000 mL | Freq: Two times a day (BID) | INTRAMUSCULAR | Status: DC
  Administered 2014-11-27: 3 mL via INTRAVENOUS

## 2014-11-26 MED ORDER — ACETAMINOPHEN 325 MG PO TABS: 650.0000 mg | ORAL_TABLET | Freq: Four times a day (QID) | ORAL | Status: DC | PRN

## 2014-11-26 MED ORDER — LORATADINE 10 MG PO TABS
10.0000 mg | ORAL_TABLET | Freq: Every day | ORAL | Status: DC
  Administered 2014-11-27: 10 mg via ORAL
  Filled 2014-11-26: qty 1

## 2014-11-26 MED ORDER — HYDROMORPHONE HCL 1 MG/ML IJ SOLN
0.5000 mg | Freq: Once | INTRAMUSCULAR | Status: AC
  Administered 2014-11-26: 0.5 mg via INTRAVENOUS
  Filled 2014-11-26: qty 1

## 2014-11-26 NOTE — H&P (Signed)
PCP:  Jeremy SalisburyFRY,STEPHEN A, MD haven't seen yet   Chief Complaint:  syncope  HPI: Jeremy King is King 64 y.o. male   has King past medical history of Kidney stones; Atrial fibrillation; Back ache; and Dysrhythmia.   Presented with  Patient reports waking up in AM and going to the kitchen to smoke. He was standing in the kitchen with the vent on, smoking when he suddenly fell down hitting his head. He is not sure how long he was out but thinks it was very quick. He woke up as soon as he hit the floor.  No confusion. No chest pain no shortness of breath. Have been eating well prior to presentation to ER now reports feeling thirsty.  He had one episode of vomiting.  Prior to this he was in his regular state of health. Has hx of King.fib on Aspirin 325 BID. He had one prior episode of syncope when he stood up to answer door. Reports occasional dizzy episodes when he stands up suddenly over the past few years. IN ER noted orthostatic. Currently back to baseline. States his shoulder was hurt. In ER plain film of the shoulder was unremarkable CT of the head showed no acute changes.  Hospitalist was called for admission for Syncope  Review of Systems:    Pertinent positives include:  Syncope, nausea, vomiting  Constitutional:  No weight loss, night sweats, Fevers, chills, fatigue, weight loss  HEENT:  No headaches, Difficulty swallowing,Tooth/dental problems,Sore throat,  No sneezing, itching, ear ache, nasal congestion, post nasal drip,  Cardio-vascular:  No chest pain, Orthopnea, PND, anasarca, dizziness, palpitations.no Bilateral lower extremity swelling  GI:  No heartburn, indigestion, abdominal pain, , diarrhea, change in bowel habits, loss of appetite, melena, blood in stool, hematemesis Resp:  no shortness of breath at rest. No dyspnea on exertion, No excess mucus, no productive cough, No non-productive cough, No coughing up of blood.No change in color of mucus.No wheezing. Skin:  no rash or  lesions. No jaundice GU:  no dysuria, change in color of urine, no urgency or frequency. No straining to urinate.  No flank pain.  Musculoskeletal:  No joint pain or no joint swelling. No decreased range of motion. No back pain.  Psych:  No change in mood or affect. No depression or anxiety. No memory loss.  Neuro: no localizing neurological complaints, no tingling, no weakness, no double vision, no gait abnormality, no slurred speech, no confusion  Otherwise ROS are negative except for above, 10 systems were reviewed  Past Medical History: Past Medical History  Diagnosis Date  . Kidney stones   . Atrial fibrillation   . Back ache   . Dysrhythmia    Past Surgical History  Procedure Laterality Date  . Lithotripsy       Medications: Prior to Admission medications   Medication Sig Start Date End Date Taking? Authorizing Provider  Ascorbic Acid (VITAMIN C) 1000 MG tablet Take 1,000 mg by mouth daily.   Yes Historical Provider, MD  aspirin 325 MG tablet Take 325 mg by mouth 2 (two) times daily.   Yes Historical Provider, MD  clonazePAM (KLONOPIN) 1 MG tablet Take 1 mg by mouth 2 (two) times daily.   Yes Historical Provider, MD  fluticasone (FLONASE) 50 MCG/ACT nasal spray Place 2 sprays into both nostrils daily.    Yes Historical Provider, MD  Garlic 1000 MG CAPS Take 1 capsule by mouth daily.   Yes Historical Provider, MD  ibuprofen (ADVIL,MOTRIN) 200 MG tablet  Take 800 mg by mouth every 6 (six) hours as needed for moderate pain (pain).   Yes Historical Provider, MD  loratadine (CLARITIN) 10 MG tablet Take 10 mg by mouth daily.   Yes Historical Provider, MD  Menthol, Topical Analgesic, 1 % AERP Apply 1 application topically 2 (two) times daily.    Yes Historical Provider, MD  multivitamin (ONE-King-DAY MEN'S) TABS tablet Take 1 tablet by mouth daily.   Yes Historical Provider, MD  Omega-3 Fatty Acids (FISH OIL) 1000 MG CAPS Take 1 capsule by mouth daily.   Yes Historical Provider, MD    traZODone (DESYREL) 100 MG tablet Take 100 mg by mouth at bedtime.   Yes Historical Provider, MD  venlafaxine XR (EFFEXOR-XR) 150 MG 24 hr capsule Take 150 mg by mouth daily with breakfast.   Yes Historical Provider, MD  erythromycin ophthalmic ointment Apply to eye every 6 (six) hours. Place 1/2 inch ribbon of ointment in the affected eye 4 times King day Patient not taking: Reported on 11/26/2014 06/10/13   Mora Bellman, PA-C    Allergies:   Allergies  Allergen Reactions  . Sertraline Hcl     Social History:  Ambulatory   independently   Lives at home alone,    reports that he has been smoking Cigarettes.  He does not have any smokeless tobacco history on file. He reports that he does not drink alcohol or use illicit drugs.    Family History: family history includes Alcoholism in his father; CAD in his father; Cancer in his sister; Diabetes in his father; Heart failure in his father and mother; Hypertension in his father.    Physical Exam: Patient Vitals for the past 24 hrs:  BP Temp Temp src Pulse Resp SpO2  11/26/14 2100 107/80 mmHg - - 108 22 98 %  11/26/14 1920 110/59 mmHg 98.4 F (36.9 C) Oral 104 20 97 %    1. General:  in No Acute distress 2. Psychological: Alert and  Oriented 3. Head/ENT:   Moist  Mucous Membranes                          Head  Traumatic mild hematoma, neck supple                          Normal  Dentition 4. SKIN:   decreased Skin turgor,  Skin clean Dry and intact no rash 5. Heart: Regular rate and rhythm no Murmur, Rub or gallop 6. Lungs: Clear to auscultation bilaterally, no wheezes or crackles   7. Abdomen: Soft, non-tender, Non distended 8. Lower extremities: no clubbing, cyanosis, or edema 9. Neurologically Grossly intact, moving all 4 extremities equally 10. MSK: Normal range of motion  body mass index is unknown because there is no height or weight on file.   Labs on Admission:   Results for orders placed or performed during the  hospital encounter of 11/26/14 (from the past 24 hour(s))  CBC with Differential/Platelet     Status: Abnormal   Collection Time: 11/26/14  8:23 PM  Result Value Ref Range   WBC 11.8 (H) 4.0 - 10.5 K/uL   RBC 4.45 4.22 - 5.81 MIL/uL   Hemoglobin 14.7 13.0 - 17.0 g/dL   HCT 16.1 09.6 - 04.5 %   MCV 96.2 78.0 - 100.0 fL   MCH 33.0 26.0 - 34.0 pg   MCHC 34.3 30.0 - 36.0 g/dL   RDW  12.8 11.5 - 15.5 %   Platelets 225 150 - 400 K/uL   Neutrophils Relative % 87 (H) 43 - 77 %   Neutro Abs 10.2 (H) 1.7 - 7.7 K/uL   Lymphocytes Relative 8 (L) 12 - 46 %   Lymphs Abs 1.0 0.7 - 4.0 K/uL   Monocytes Relative 5 3 - 12 %   Monocytes Absolute 0.6 0.1 - 1.0 K/uL   Eosinophils Relative 0 0 - 5 %   Eosinophils Absolute 0.0 0.0 - 0.7 K/uL   Basophils Relative 0 0 - 1 %   Basophils Absolute 0.0 0.0 - 0.1 K/uL  Basic metabolic panel     Status: Abnormal   Collection Time: 11/26/14  8:23 PM  Result Value Ref Range   Sodium 140 135 - 145 mmol/L   Potassium 4.0 3.5 - 5.1 mmol/L   Chloride 109 96 - 112 mmol/L   CO2 24 19 - 32 mmol/L   Glucose, Bld 104 (H) 70 - 99 mg/dL   BUN 21 6 - 23 mg/dL   Creatinine, Ser 1.61 0.50 - 1.35 mg/dL   Calcium 9.4 8.4 - 09.6 mg/dL   GFR calc non Af Amer 63 (L) >90 mL/min   GFR calc Af Amer 73 (L) >90 mL/min   Anion gap 7 5 - 15    UA not obtained  No results found for: HGBA1C  CrCl cannot be calculated (Unknown ideal weight.).  BNP (last 3 results) No results for input(s): PROBNP in the last 8760 hours.  Other results:  I have pearsonaly reviewed this: ECG REPORT  Rate: 98  Rhythm: King.fib with partial RBBB ST&T Change: no ischemic changes    There were no vitals filed for this visit.   Cultures: No results found for: SDES, SPECREQUEST, CULT, REPTSTATUS   Radiological Exams on Admission: Dg Shoulder Right  11/26/2014   CLINICAL DATA:  Initial evaluation right lateral shoulder pain after fall and hours ago  EXAM: RIGHT SHOULDER - 2+ VIEW  COMPARISON:   None.  FINDINGS: Mild to moderate hypertrophy of the acromioclavicular joint. No fracture or dislocation.  IMPRESSION: No acute findings   Electronically Signed   By: Esperanza Heir M.D.   On: 11/26/2014 20:24   Ct Head Wo Contrast  11/26/2014   CLINICAL DATA:  Larey Seat in kitchen at home, hit post rt head, prev ct head 01/21/06 also s/p fall  EXAM: CT HEAD WITHOUT CONTRAST  TECHNIQUE: Contiguous axial images were obtained from the base of the skull through the vertex without intravenous contrast.  COMPARISON:  01/21/2006  FINDINGS: Right parietal scalp hematoma.  There is no underlying fracture.  Ventricles are normal in size and configuration. There are no parenchymal masses or mass effect. There is no evidence of King cortical infarct. Patchy areas of white matter hypoattenuation are noted consistent with mild chronic microvascular ischemic change.  There are no extra-axial masses or abnormal fluid collections.  No intracranial hemorrhage.  Visualized sinuses and mastoid air cells are clear.  IMPRESSION: 1. No acute intracranial abnormalities.  No skull fracture. 2. Right parietal scalp hematoma.   Electronically Signed   By: Amie Portland M.D.   On: 11/26/2014 20:01    Chart has been reviewed  Assessment/Plan  64 year old gentleman with history of paroxysmal atrial fibrillation on full dose aspirin twice King day Presents after syncopal event with no prodrome which was very short-lived and associated with patient standing up and smoking noted to be orthostatic in emergency department.  Present on  Admission:  . Syncope - most likely secondary to orthostatic hypotension. Although no prodrome is King little and worrisome. We'll admit to telemetry and monitor for any dysrhythmias. Cycle cardiac enzymes. Obtain echogram given history of atrial fibrillation. Obtain carotid Dopplers. Patient may benefit from event monitor as an outpatient  . Paroxysmal King-fib - currently rate controlled CHAD2-vasc score of 1 Will  obtain echogram check hemoglobin A1c to further risk stratify  . Orthostatic hypotension - we'll give gentle IV fluids repeat orthostatics   Prophylaxis: Lovenox,    CODE STATUS:  FULL CODE    Other plan as per orders.  I have spent King total of 55 min on this admission  Wayne Wicklund 11/26/2014, 11:01 PM  [facility]  Pager [phone]   after 2 AM please page floor coverage PA If 7AM-7PM, please contact the day team taking care of the patient  [email or web address]  Password [email or web address]

## 2014-11-26 NOTE — ED Provider Notes (Signed)
CSN: [id number]     Arrival date & time 11/26/14  1909 History   First MD Initiated Contact with Patient 11/26/14 1921     Chief Complaint  Patient presents with  . Fall  . Loss of Consciousness  . Shoulder Injury     (Consider location/radiation/quality/duration/timing/severity/associated sxs/prior Treatment) HPI Patient reports he fell today approximately 11 AM. He has no recall of the event. He is uncertain whether he had syncopal event,, however he remembers standing at his stove, lighting a cigarette, then he found himself on the floor. He injured his right occipital parietal area and right shoulder as result of event. Brought by EMS EMS treated patient with arm sling. No other treatment prior to coming here. Arm pain is worse with moving his shoulder. He also complains of moderate occipital parietal headache since the event. Nothing makes symptoms better or worse. Denies alcohol or drug use Past Medical History  Diagnosis Date  . Kidney stones   . Atrial fibrillation   . Back ache    Past Surgical History  Procedure Laterality Date  . Lithotripsy     Family History  Problem Relation Age of Onset  . Heart failure Mother   . Diabetes Father   . Heart failure Father   . Hypertension Father    History  Substance Use Topics  . Smoking status: Current Every Day Smoker    Types: Cigarettes  . Smokeless tobacco: Not on file  . Alcohol Use: No   no drug use  Review of Systems  Constitutional: Negative.   Respiratory: Negative.   Cardiovascular: Negative.   Gastrointestinal: Negative.   Musculoskeletal: Positive for arthralgias.       Right shoulder pain  Skin: Negative.   Neurological: Positive for headaches.  Psychiatric/Behavioral: Negative.   All other systems reviewed and are negative.     Allergies  Sertraline hcl  Home Medications   Prior to Admission medications   Medication Sig Start Date End Date Taking? Authorizing Provider  erythromycin ophthalmic  ointment Apply to eye every 6 (six) hours. Place 1/2 inch ribbon of ointment in the affected eye 4 times a day 06/10/13   Mora Bellman, PA-C   BP 110/59 mmHg  Pulse 104  Temp(Src) 98.4 F (36.9 C) (Oral)  Resp 20  SpO2 97% Physical Exam  Constitutional: He is oriented to person, place, and time. He appears well-developed and well-nourished.  HENT:  Right Ear: External ear normal.  Left Ear: External ear normal.  Nose: Nose normal.  Small right sided parietal occipital scalp hematoma otherwise normocephalic atraumatic. Bilateral tympanic membranes normal  Eyes: Conjunctivae are normal. Pupils are equal, round, and reactive to light.  Neck: Neck supple. No tracheal deviation present. No thyromegaly present.  Cardiovascular: Normal rate.   No murmur heard. Irregularly irregular  Pulmonary/Chest: Effort normal and breath sounds normal.  Abdominal: Soft. Bowel sounds are normal. He exhibits no distension. There is no tenderness.  Musculoskeletal: Normal range of motion. He exhibits no edema or tenderness.  Right upper extremity no swelling surgical scar anteriorly no deformity, minimally tender at shoulder. Full range of motion. Radial pulse 2+ all other extremities without contusion abrasion or tenderness neurovascularly intact. Entire spine nontender.  Neurological: He is alert and oriented to person, place, and time. No cranial nerve deficit. Coordination normal.  Motor strength 5 over 5 overall moves all extremities well Glasgow Coma Score 15  Skin: Skin is warm and dry. No rash noted.  Psychiatric: He has a normal  mood and affect.  Nursing note and vitals reviewed.   ED Course  Procedures (including critical care time) Labs Review Labs Reviewed - No data to display  Imaging Review No results found.   EKG Interpretation   Date/Time:  Saturday November 26 2014 19:25:35 EST Ventricular Rate:  98 PR Interval:    QRS Duration: 118 QT Interval:  354 QTC Calculation: 452 R  Axis:   48 Text Interpretation:  Atrial fibrillation Incomplete right bundle branch  block Probable lateral infarct, old Confirmed by Jariana Shumard  MD, Talah Cookston  [phone]([id number]) on 11/26/2014 8:02:39 PM     X-ray viewed by me 10:40 PM patient states his pain is under control after treatment with intravenous opioids. He is alert Glasgow Coma Score 15 Results for orders placed or performed during the hospital encounter of 11/26/14  CBC with Differential/Platelet  Result Value Ref Range   WBC 11.8 (H) 4.0 - 10.5 K/uL   RBC 4.45 4.22 - 5.81 MIL/uL   Hemoglobin 14.7 13.0 - 17.0 g/dL   HCT 01.042.8 27.239.0 - 53.652.0 %   MCV 96.2 78.0 - 100.0 fL   MCH 33.0 26.0 - 34.0 pg   MCHC 34.3 30.0 - 36.0 g/dL   RDW 64.412.8 03.411.5 - 74.215.5 %   Platelets 225 150 - 400 K/uL   Neutrophils Relative % 87 (H) 43 - 77 %   Neutro Abs 10.2 (H) 1.7 - 7.7 K/uL   Lymphocytes Relative 8 (L) 12 - 46 %   Lymphs Abs 1.0 0.7 - 4.0 K/uL   Monocytes Relative 5 3 - 12 %   Monocytes Absolute 0.6 0.1 - 1.0 K/uL   Eosinophils Relative 0 0 - 5 %   Eosinophils Absolute 0.0 0.0 - 0.7 K/uL   Basophils Relative 0 0 - 1 %   Basophils Absolute 0.0 0.0 - 0.1 K/uL  Basic metabolic panel  Result Value Ref Range   Sodium 140 135 - 145 mmol/L   Potassium 4.0 3.5 - 5.1 mmol/L   Chloride 109 96 - 112 mmol/L   CO2 24 19 - 32 mmol/L   Glucose, Bld 104 (H) 70 - 99 mg/dL   BUN 21 6 - 23 mg/dL   Creatinine, Ser 5.951.18 0.50 - 1.35 mg/dL   Calcium 9.4 8.4 - 63.810.5 mg/dL   GFR calc non Af Amer 63 (L) >90 mL/min   GFR calc Af Amer 73 (L) >90 mL/min   Anion gap 7 5 - 15   Dg Shoulder Right  11/26/2014   CLINICAL DATA:  Initial evaluation right lateral shoulder pain after fall and hours ago  EXAM: RIGHT SHOULDER - 2+ VIEW  COMPARISON:  None.  FINDINGS: Mild to moderate hypertrophy of the acromioclavicular joint. No fracture or dislocation.  IMPRESSION: No acute findings   Electronically Signed   By: Esperanza Heiraymond  Rubner M.D.   On: 11/26/2014 20:24   Ct Head Wo  Contrast  11/26/2014   CLINICAL DATA:  Larey SeatFell in kitchen at home, hit post rt head, prev ct head 01/21/06 also s/p fall  EXAM: CT HEAD WITHOUT CONTRAST  TECHNIQUE: Contiguous axial images were obtained from the base of the skull through the vertex without intravenous contrast.  COMPARISON:  01/21/2006  FINDINGS: Right parietal scalp hematoma.  There is no underlying fracture.  Ventricles are normal in size and configuration. There are no parenchymal masses or mass effect. There is no evidence of a cortical infarct. Patchy areas of white matter hypoattenuation are noted consistent with mild chronic microvascular ischemic  change.  There are no extra-axial masses or abnormal fluid collections.  No intracranial hemorrhage.  Visualized sinuses and mastoid air cells are clear.  IMPRESSION: 1. No acute intracranial abnormalities.  No skull fracture. 2. Right parietal scalp hematoma.   Electronically Signed   By: Amie Portland M.D.   On: 11/26/2014 20:01    MDM  I spoke with DR. Doutova plan Telemetry, 23 hour observation, to watch for dysrhythmia orthostastic vitals as requested by Dr ,Adela Glimpse Final diagnoses:  None   Diagnoses #1 syncope #2 minor closed head trauma #3 contusion of right shoulder     Doug Sou, MD 11/26/14 2248

## 2014-11-26 NOTE — ED Notes (Signed)
Pt presents from home via EMS with c/o fall that occurred approx 8 hours ago. Pt had a syncopal episode. Pt is unsure whether the syncopal episode happened before or after fall, reports that he just remembers going down to the floor, positive LOC. Pt c/o right shoulder pain, no deformity noted per EMS, swelling only. Pt has hx of shoulder surgeries on both surgeries. Pt has a hematoma to the back right of his head. Pt has a hx of afib as well, currently in afib per EMS, no complaints related to the afib at this time.

## 2014-11-26 NOTE — ED Notes (Signed)
Bed: WA02 Expected date: 11/26/14 Expected time: 7:02 PM Means of arrival: Ambulance Comments: fall

## 2014-11-27 DIAGNOSIS — I4891 Unspecified atrial fibrillation: Secondary | ICD-10-CM | POA: Diagnosis not present

## 2014-11-27 DIAGNOSIS — R55 Syncope and collapse: Secondary | ICD-10-CM

## 2014-11-27 DIAGNOSIS — I951 Orthostatic hypotension: Secondary | ICD-10-CM | POA: Diagnosis not present

## 2014-11-27 DIAGNOSIS — I480 Paroxysmal atrial fibrillation: Secondary | ICD-10-CM | POA: Diagnosis not present

## 2014-11-27 LAB — CREATININE, SERUM
CREATININE: 1.04 mg/dL (ref 0.50–1.35)
GFR, EST AFRICAN AMERICAN: 85 mL/min — AB (ref >90)
GFR, EST NON AFRICAN AMERICAN: 73 mL/min — AB (ref >90)

## 2014-11-27 LAB — URINE MICROSCOPIC-ADD ON

## 2014-11-27 LAB — CBC
HEMATOCRIT: 40.1 % (ref 39.0–52.0)
HEMATOCRIT: 42.1 % (ref 39.0–52.0)
HEMOGLOBIN: 13.5 g/dL (ref 13.0–17.0)
Hemoglobin: 14.2 g/dL (ref 13.0–17.0)
MCH: 32.6 pg (ref 26.0–34.0)
MCH: 32.7 pg (ref 26.0–34.0)
MCHC: 33.7 g/dL (ref 30.0–36.0)
MCHC: 33.7 g/dL (ref 30.0–36.0)
MCV: 96.8 fL (ref 78.0–100.0)
MCV: 97.1 fL (ref 78.0–100.0)
PLATELETS: 217 10*3/uL (ref 150–400)
Platelets: 184 10*3/uL (ref 150–400)
RBC: 4.13 MIL/uL — ABNORMAL LOW (ref 4.22–5.81)
RBC: 4.35 MIL/uL (ref 4.22–5.81)
RDW: 12.9 % (ref 11.5–15.5)
RDW: 12.8 % (ref 11.5–15.5)
WBC: 10.9 10*3/uL — ABNORMAL HIGH (ref 4.0–10.5)
WBC: 8.9 10*3/uL (ref 4.0–10.5)

## 2014-11-27 LAB — COMPREHENSIVE METABOLIC PANEL
ALT: 13 U/L (ref 0–53)
AST: 14 U/L (ref 0–37)
Albumin: 3.5 g/dL (ref 3.5–5.2)
Alkaline Phosphatase: 51 U/L (ref 39–117)
Anion gap: 6 (ref 5–15)
BUN: 23 mg/dL (ref 6–23)
CALCIUM: 8.7 mg/dL (ref 8.4–10.5)
CO2: 26 mmol/L (ref 19–32)
Chloride: 108 mmol/L (ref 96–112)
Creatinine, Ser: 0.93 mg/dL (ref 0.50–1.35)
GFR calc Af Amer: >90 mL/min (ref >90)
GFR calc non Af Amer: 86 mL/min — ABNORMAL LOW (ref >90)
Glucose, Bld: 100 mg/dL — ABNORMAL HIGH (ref 70–99)
Potassium: 3.6 mmol/L (ref 3.5–5.1)
Sodium: 140 mmol/L (ref 135–145)
Total Bilirubin: 0.7 mg/dL (ref 0.3–1.2)
Total Protein: 5.9 g/dL — ABNORMAL LOW (ref 6.0–8.3)

## 2014-11-27 LAB — URINALYSIS, ROUTINE W REFLEX MICROSCOPIC
Bilirubin Urine: NEGATIVE
Glucose, UA: NEGATIVE mg/dL
Hgb urine dipstick: NEGATIVE
KETONES UR: NEGATIVE mg/dL
Leukocytes, UA: NEGATIVE
NITRITE: NEGATIVE
PH: 5.5 (ref 5.0–8.0)
Protein, ur: NEGATIVE mg/dL
SPECIFIC GRAVITY, URINE: 1.034 — AB (ref 1.005–1.030)
Urobilinogen, UA: 1 mg/dL (ref 0.0–1.0)

## 2014-11-27 LAB — PHOSPHORUS: Phosphorus: 3.5 mg/dL (ref 2.3–4.6)

## 2014-11-27 LAB — LIPID PANEL
CHOL/HDL RATIO: 5.9 ratio
Cholesterol: 199 mg/dL (ref 0–200)
HDL: 34 mg/dL — AB (ref >39)
LDL Cholesterol: 149 mg/dL — ABNORMAL HIGH (ref 0–99)
TRIGLYCERIDES: 82 mg/dL (ref <150)
VLDL: 16 mg/dL (ref 0–40)

## 2014-11-27 LAB — MAGNESIUM: Magnesium: 2.1 mg/dL (ref 1.5–2.5)

## 2014-11-27 LAB — TROPONIN I: Troponin I: <0.03 ng/mL (ref <0.031)

## 2014-11-27 LAB — TSH: TSH: 0.556 u[IU]/mL (ref 0.350–4.500)

## 2014-11-27 NOTE — Progress Notes (Signed)
[facility] PROGRESS NOTE  Jeremy King:[id number] DOB: 12/16/1950 DOA: 11/26/2014 PCP: Nelwyn Salisbury, MD  Assessment/Plan:  Syncope  - Most likely secondary to orthostatic hypotension, repeat orthostatics done today, patient not orthostatic.  Patient did not have any prodrome of symptoms prior to the fall.  Patient does not recall if he lost consciousness.  No events on telemetry.  Cardiac enzymes negative.  2-D echocardiogram and carotid Dopplers pending.  As patient was orthostatic, likely the cause for syncope.  Did not see the need for Holter monitor as patient likely had a syncopal episode due to orthostatic hypotension.  If patient has any further episodes may consider Holter monitor, as outpatient.  Patient has been instructed to adequately hydrate himself, patient admits she does not hydrate himself like he should.  Paroxysmal a-fib  - Currently rate controlled CHAD2-vasc score of 1.  Continue full dose aspirin.  LDL 149.    Orthostatic hypotension  - Patient given gentle IV fluids, not orthostatic this AM.  Code Status: Full code. Family Communication: Patient updated at bedside.  Disposition Plan: Pending, possible discharge today after echo and carotid results.  Consultants:  None  Procedures:  Echo 11/27/2014  Carotid dopplers 11/27/2014: Bilateral: 1-39% ICA stenosis. Vertebral artery flow is antegrade.  Antibiotics:  None  HPI/Subjective: No specific complaints.  Denies any pain.  Denies any dizziness or lightheadedness.  Objective: Filed Vitals:   11/26/14 2100 11/26/14 2322 11/26/14 2347 11/27/14 0445  BP: 107/80 105/71 116/79 109/70  Pulse: 108 95 87 78  Temp:  98.4 F (36.9 C) 98.3 F (36.8 C) 97.9 F (36.6 C)  TempSrc:  Oral Oral Oral  Resp: Height:   6' (1.829 m)   Weight:   90.6 kg (199 lb 11.8 oz)   SpO2: 98% 95% 100% 97%    Intake/Output Summary (Last 24 hours) at 11/27/14 1206 Last data filed at 11/27/14 0900   Gross per 24 hour  Intake    240 ml  Output      0 ml  Net    240 ml   Filed Weights   11/26/14 2347  Weight: 90.6 kg (199 lb 11.8 oz)    Exam:  Physical Exam: General: Awake, Oriented, No acute distress. HEENT: EOMI. Neck: Supple CV: S1 and S2 Lungs: Clear to ascultation bilaterally Abdomen: Soft, Nontender, Nondistended, +bowel sounds. Ext: Good pulses. Trace edema.  Data Reviewed: Basic Metabolic Panel:  Recent Labs Lab 11/26/14 2023 11/27/14 0015 11/27/14 0615  NA 140  --  140  K 4.0  --  3.6  CL 109  --  108  CO2 24  --  26  GLUCOSE 104*  --  100*  BUN 21  --  23  CREATININE 1.18 1.04 0.93  CALCIUM 9.4  --  8.7  MG  --   --  2.1  PHOS  --   --  3.5   Liver Function Tests:  Recent Labs Lab 11/27/14 0615  AST 14  ALT 13  ALKPHOS 51  BILITOT 0.7  PROT 5.9*  ALBUMIN 3.5   No results for input(s): LIPASE, AMYLASE in the last 168 hours. No results for input(s): AMMONIA in the last 168 hours. CBC:  Recent Labs Lab 11/26/14 2023 11/27/14 0015 11/27/14 0615  WBC 11.8* 10.9* 8.9  NEUTROABS 10.2*  --   --   HGB 14.7 14.2 13.5  HCT 42.8 42.1 40.1  MCV 96.2 96.8 97.1  PLT 225 217 184  Cardiac Enzymes:  Recent Labs Lab 11/27/14 0015 11/27/14 0615  TROPONINI <0.03 <0.03   BNP (last 3 results) No results for input(s): BNP in the last 8760 hours.  ProBNP (last 3 results) No results for input(s): PROBNP in the last 8760 hours.  CBG: No results for input(s): GLUCAP in the last 168 hours.  No results found for this or any previous visit (from the past 240 hour(s)).   Studies: Dg Shoulder Right  11/26/2014   CLINICAL DATA:  Initial evaluation right lateral shoulder pain after fall and hours ago  EXAM: RIGHT SHOULDER - 2+ VIEW  COMPARISON:  None.  FINDINGS: Mild to moderate hypertrophy of the acromioclavicular joint. No fracture or dislocation.  IMPRESSION: No acute findings   Electronically Signed   By: Esperanza Heiraymond  Rubner M.D.   On: 11/26/2014  20:24   Ct Head Wo Contrast  11/26/2014   CLINICAL DATA:  Larey SeatFell in kitchen at home, hit post rt head, prev ct head 01/21/06 also s/p fall  EXAM: CT HEAD WITHOUT CONTRAST  TECHNIQUE: Contiguous axial images were obtained from the base of the skull through the vertex without intravenous contrast.  COMPARISON:  01/21/2006  FINDINGS: Right parietal scalp hematoma.  There is no underlying fracture.  Ventricles are normal in size and configuration. There are no parenchymal masses or mass effect. There is no evidence of a cortical infarct. Patchy areas of white matter hypoattenuation are noted consistent with mild chronic microvascular ischemic change.  There are no extra-axial masses or abnormal fluid collections.  No intracranial hemorrhage.  Visualized sinuses and mastoid air cells are clear.  IMPRESSION: 1. No acute intracranial abnormalities.  No skull fracture. 2. Right parietal scalp hematoma.   Electronically Signed   By: Amie Portlandavid  Ormond M.D.   On: 11/26/2014 20:01    Scheduled Meds: . aspirin  325 mg Oral Daily  . clonazePAM  1 mg Oral BID  . docusate sodium  100 mg Oral BID  . enoxaparin (LOVENOX) injection  40 mg Subcutaneous QHS  . loratadine  10 mg Oral Daily  . sodium chloride  3 mL Intravenous Q12H  . traZODone  100 mg Oral QHS  . venlafaxine XR  150 mg Oral Q breakfast   Continuous Infusions:   Active Problems:   Syncope   Paroxysmal a-fib   Orthostatic hypotension   Syncope and collapse    [name] A, MD  [facility] Pager [phone]. If 7PM-7AM, please contact night-coverage at [email or web address], password [email or web address] 11/27/2014, 12:06 PM  LOS: 1 day

## 2014-11-27 NOTE — Progress Notes (Signed)
Echocardiogram 2D Echocardiogram has been performed.  Estelle GrumblesMyers, Tujuana Kilmartin J 11/27/2014, 11:59 AM

## 2014-11-27 NOTE — Discharge Summary (Signed)
Discharge Summary  Jeremy King MR#: [id number]  DOB:December 22, 1950  Date of Admission: 11/26/2014 Date of Discharge: 11/27/2014  Patient's PCP: Laurey Morale, MD  Attending Physician:Krissi Willaims A  Consults: None  Discharge Diagnoses: Active Problems:   Syncope   Paroxysmal a-fib   Orthostatic hypotension   Syncope and collapse   Brief Admitting History and Physical On admission: "Jeremy King is a 64 y.o. male   has a past medical history of Kidney stones; Atrial fibrillation; Back ache; and Dysrhythmia.   Presented with  Patient reports waking up in AM and going to the kitchen to smoke. He was standing in the kitchen with the vent on, smoking when he suddenly fell down hitting his head. He is not sure how long he was out but thinks it was very quick. He woke up as soon as he hit the floor. No confusion. No chest pain no shortness of breath. Have been eating well prior to presentation to ER now reports feeling thirsty. He had one episode of vomiting. Prior to this he was in his regular state of health. Has hx of A.fib on Aspirin 325 BID. He had one prior episode of syncope when he stood up to answer door. Reports occasional dizzy episodes when he stands up suddenly over the past few years. IN ER noted orthostatic. Currently back to baseline. States his shoulder was hurt. In ER plain film of the shoulder was unremarkable CT of the head showed no acute changes.  Hospitalist was called for admission for Syncope."  Discharge Medications   Medication List    TAKE these medications        aspirin 325 MG tablet  Take 325 mg by mouth 2 (two) times daily.     clonazePAM 1 MG tablet  Commonly known as:  KLONOPIN  Take 1 mg by mouth 2 (two) times daily.     erythromycin ophthalmic ointment  Apply to eye every 6 (six) hours. Place 1/2 inch ribbon of ointment in the affected eye 4 times a day     Fish Oil 1000 MG Caps  Take 1 capsule by mouth daily.     fluticasone 50  MCG/ACT nasal spray  Commonly known as:  FLONASE  Place 2 sprays into both nostrils daily.     Garlic 2263 MG Caps  Take 1 capsule by mouth daily.     ibuprofen 200 MG tablet  Commonly known as:  ADVIL,MOTRIN  Take 800 mg by mouth every 6 (six) hours as needed for moderate pain (pain).     loratadine 10 MG tablet  Commonly known as:  CLARITIN  Take 10 mg by mouth daily.     Menthol (Topical Analgesic) 1 % Aerp  Apply 1 application topically 2 (two) times daily.     multivitamin Tabs tablet  Take 1 tablet by mouth daily.     traZODone 100 MG tablet  Commonly known as:  DESYREL  Take 100 mg by mouth at bedtime.     venlafaxine XR 150 MG 24 hr capsule  Commonly known as:  EFFEXOR-XR  Take 150 mg by mouth daily with breakfast.     vitamin C 1000 MG tablet  Take 1,000 mg by mouth daily.        Hospital Course: <principal problem not specified> Present on Admission:  . Syncope . Paroxysmal a-fib . Orthostatic hypotension   Syncope  - Most likely secondary to orthostatic hypotension, repeat orthostatics done today prior to discharge, patient not orthostatic. Patient did not  have any prodrome of symptoms prior to the fall. Patient does not recall if he lost consciousness. No events on telemetry. Cardiac enzymes negative. 2-D echocardiogram and carotid Dopplers done with results as indicated below. As patient was orthostatic, likely the cause for syncope. Did not see the need for Holter monitor as patient likely had a syncopal episode due to orthostatic hypotension. If patient has any further episodes may consider Holter monitor, as outpatient. Patient has been instructed to adequately hydrate himself, patient admits he does not hydrate himself like he should.  Paroxysmal a-fib  - Currently rate controlled CHAD2-vasc score of 1. Continue full dose aspirin. LDL 149.   Orthostatic hypotension  - Patient given gentle IV fluids at the time of admission, not  orthostatic prior to discharge.  Patient ambulated without symptoms prior to discharge.  Consultants:  None  Procedures:  Echo 11/27/2014  Carotid dopplers 11/27/2014: Bilateral: 1-39% ICA stenosis. Vertebral artery flow is antegrade.  Antibiotics:  None  Day of Discharge BP 105/66 mmHg  Pulse 69  Temp(Src) 98 F (36.7 C) (Oral)  Resp 18  Ht 6' (1.829 m)  Wt 90.6 kg (199 lb 11.8 oz)  BMI 27.08 kg/m2  SpO2 96%  Results for orders placed or performed during the hospital encounter of 11/26/14 (from the past 48 hour(s))  CBC with Differential/Platelet     Status: Abnormal   Collection Time: 11/26/14  8:23 PM  Result Value Ref Range   WBC 11.8 (H) 4.0 - 10.5 K/uL   RBC 4.45 4.22 - 5.81 MIL/uL   Hemoglobin 14.7 13.0 - 17.0 g/dL   HCT 42.8 39.0 - 52.0 %   MCV 96.2 78.0 - 100.0 fL   MCH 33.0 26.0 - 34.0 pg   MCHC 34.3 30.0 - 36.0 g/dL   RDW 12.8 11.5 - 15.5 %   Platelets 225 150 - 400 K/uL   Neutrophils Relative % 87 (H) 43 - 77 %   Neutro Abs 10.2 (H) 1.7 - 7.7 K/uL   Lymphocytes Relative 8 (L) 12 - 46 %   Lymphs Abs 1.0 0.7 - 4.0 K/uL   Monocytes Relative 5 3 - 12 %   Monocytes Absolute 0.6 0.1 - 1.0 K/uL   Eosinophils Relative 0 0 - 5 %   Eosinophils Absolute 0.0 0.0 - 0.7 K/uL   Basophils Relative 0 0 - 1 %   Basophils Absolute 0.0 0.0 - 0.1 K/uL  Basic metabolic panel     Status: Abnormal   Collection Time: 11/26/14  8:23 PM  Result Value Ref Range   Sodium 140 135 - 145 mmol/L   Potassium 4.0 3.5 - 5.1 mmol/L   Chloride 109 96 - 112 mmol/L   CO2 24 19 - 32 mmol/L   Glucose, Bld 104 (H) 70 - 99 mg/dL   BUN 21 6 - 23 mg/dL   Creatinine, Ser 1.18 0.50 - 1.35 mg/dL   Calcium 9.4 8.4 - 10.5 mg/dL   GFR calc non Af Amer 63 (L) >90 mL/min   GFR calc Af Amer 73 (L) >90 mL/min    Comment: (NOTE) The eGFR has been calculated using the CKD EPI equation. This calculation has not been validated in all clinical situations. eGFR's persistently <90 mL/min signify  possible Chronic Kidney Disease.    Anion gap 7 5 - 15  CBC     Status: Abnormal   Collection Time: 11/27/14 12:15 AM  Result Value Ref Range   WBC 10.9 (H) 4.0 - 10.5 K/uL  RBC 4.35 4.22 - 5.81 MIL/uL   Hemoglobin 14.2 13.0 - 17.0 g/dL   HCT 42.1 39.0 - 52.0 %   MCV 96.8 78.0 - 100.0 fL   MCH 32.6 26.0 - 34.0 pg   MCHC 33.7 30.0 - 36.0 g/dL   RDW 12.8 11.5 - 15.5 %   Platelets 217 150 - 400 K/uL  Creatinine, serum     Status: Abnormal   Collection Time: 11/27/14 12:15 AM  Result Value Ref Range   Creatinine, Ser 1.04 0.50 - 1.35 mg/dL   GFR calc non Af Amer 73 (L) >90 mL/min   GFR calc Af Amer 85 (L) >90 mL/min    Comment: (NOTE) The eGFR has been calculated using the CKD EPI equation. This calculation has not been validated in all clinical situations. eGFR's persistently <90 mL/min signify possible Chronic Kidney Disease.   Troponin I     Status: None   Collection Time: 11/27/14 12:15 AM  Result Value Ref Range   Troponin I <0.03 <0.031 ng/mL    Comment:        NO INDICATION OF MYOCARDIAL INJURY.   Urinalysis, Routine w reflex microscopic     Status: Abnormal   Collection Time: 11/27/14  5:31 AM  Result Value Ref Range   Color, Urine AMBER (A) YELLOW    Comment: BIOCHEMICALS MAY BE AFFECTED BY COLOR   APPearance TURBID (A) CLEAR   Specific Gravity, Urine 1.034 (H) 1.005 - 1.030   pH 5.5 5.0 - 8.0   Glucose, UA NEGATIVE NEGATIVE mg/dL   Hgb urine dipstick NEGATIVE NEGATIVE   Bilirubin Urine NEGATIVE NEGATIVE   Ketones, ur NEGATIVE NEGATIVE mg/dL   Protein, ur NEGATIVE NEGATIVE mg/dL   Urobilinogen, UA 1.0 0.0 - 1.0 mg/dL   Nitrite NEGATIVE NEGATIVE   Leukocytes, UA NEGATIVE NEGATIVE  Urine microscopic-add on     Status: Abnormal   Collection Time: 11/27/14  5:31 AM  Result Value Ref Range   Squamous Epithelial / LPF RARE RARE   WBC, UA 0-2 <3 WBC/hpf   Crystals CA OXALATE CRYSTALS (A) NEGATIVE   Urine-Other AMORPHOUS URATES/PHOSPHATES     Comment: MUCOUS  PRESENT  Magnesium     Status: None   Collection Time: 11/27/14  6:15 AM  Result Value Ref Range   Magnesium 2.1 1.5 - 2.5 mg/dL  Phosphorus     Status: None   Collection Time: 11/27/14  6:15 AM  Result Value Ref Range   Phosphorus 3.5 2.3 - 4.6 mg/dL  TSH     Status: None   Collection Time: 11/27/14  6:15 AM  Result Value Ref Range   TSH 0.556 0.350 - 4.500 uIU/mL    Comment: Performed at [facility]  Comprehensive metabolic panel     Status: Abnormal   Collection Time: 11/27/14  6:15 AM  Result Value Ref Range   Sodium 140 135 - 145 mmol/L   Potassium 3.6 3.5 - 5.1 mmol/L   Chloride 108 96 - 112 mmol/L   CO2 26 19 - 32 mmol/L   Glucose, Bld 100 (H) 70 - 99 mg/dL   BUN 23 6 - 23 mg/dL   Creatinine, Ser 0.93 0.50 - 1.35 mg/dL   Calcium 8.7 8.4 - 10.5 mg/dL   Total Protein 5.9 (L) 6.0 - 8.3 g/dL   Albumin 3.5 3.5 - 5.2 g/dL   AST 14 0 - 37 U/L   ALT 13 0 - 53 U/L   Alkaline Phosphatase 51 39 - 117 U/L  Total Bilirubin 0.7 0.3 - 1.2 mg/dL   GFR calc non Af Amer 86 (L) >90 mL/min   GFR calc Af Amer >90 >90 mL/min    Comment: (NOTE) The eGFR has been calculated using the CKD EPI equation. This calculation has not been validated in all clinical situations. eGFR's persistently <90 mL/min signify possible Chronic Kidney Disease.    Anion gap 6 5 - 15  CBC     Status: Abnormal   Collection Time: 11/27/14  6:15 AM  Result Value Ref Range   WBC 8.9 4.0 - 10.5 K/uL   RBC 4.13 (L) 4.22 - 5.81 MIL/uL   Hemoglobin 13.5 13.0 - 17.0 g/dL   HCT 40.1 39.0 - 52.0 %   MCV 97.1 78.0 - 100.0 fL   MCH 32.7 26.0 - 34.0 pg   MCHC 33.7 30.0 - 36.0 g/dL   RDW 12.9 11.5 - 15.5 %   Platelets 184 150 - 400 K/uL  Troponin I     Status: None   Collection Time: 11/27/14  6:15 AM  Result Value Ref Range   Troponin I <0.03 <0.031 ng/mL    Comment:        NO INDICATION OF MYOCARDIAL INJURY.   Lipid panel     Status: Abnormal   Collection Time: 11/27/14  6:15 AM  Result Value Ref  Range   Cholesterol 199 0 - 200 mg/dL   Triglycerides 82 <150 mg/dL   HDL 34 (L) >39 mg/dL   Total CHOL/HDL Ratio 5.9 RATIO   VLDL 16 0 - 40 mg/dL   LDL Cholesterol 149 (H) 0 - 99 mg/dL    Comment:        Total Cholesterol/HDL:CHD Risk Coronary Heart Disease Risk Table                     Men   Women  1/2 Average Risk   3.4   3.3  Average Risk       5.0   4.4  2 X Average Risk   9.6   7.1  3 X Average Risk  23.4   11.0        Use the calculated Patient Ratio above and the CHD Risk Table to determine the patient's CHD Risk.        ATP III CLASSIFICATION (LDL):  <100     mg/dL   Optimal  100-129  mg/dL   Near or Above                    Optimal  130-159  mg/dL   Borderline  160-189  mg/dL   High  >190     mg/dL   Very High Performed at [facility]     Dg Shoulder Right  11/26/2014   CLINICAL DATA:  Initial evaluation right lateral shoulder pain after fall and hours ago  EXAM: RIGHT SHOULDER - 2+ VIEW  COMPARISON:  None.  FINDINGS: Mild to moderate hypertrophy of the acromioclavicular joint. No fracture or dislocation.  IMPRESSION: No acute findings   Electronically Signed   By: Skipper Cliche M.D.   On: 11/26/2014 20:24   Ct Head Wo Contrast  11/26/2014   CLINICAL DATA:  [address] in kitchen at home, hit post rt head, prev ct head 01/21/06 also s/p fall  EXAM: CT HEAD WITHOUT CONTRAST  TECHNIQUE: Contiguous axial images were obtained from the base of the skull through the vertex without intravenous contrast.  COMPARISON:  01/21/2006  FINDINGS: Right parietal scalp hematoma.  There is no underlying fracture.  Ventricles are normal in size and configuration. There are no parenchymal masses or mass effect. There is no evidence of a cortical infarct. Patchy areas of white matter hypoattenuation are noted consistent with mild chronic microvascular ischemic change.  There are no extra-axial masses or abnormal fluid collections.  No intracranial hemorrhage.  Visualized sinuses and  mastoid air cells are clear.  IMPRESSION: 1. No acute intracranial abnormalities.  No skull fracture. 2. Right parietal scalp hematoma.   Electronically Signed   By: Lajean Manes M.D.   On: 11/26/2014 20:01   2-D echocardiogram 11/27/2014 Study Conclusions - Left ventricle: The cavity size was normal. There was mild focal basal hypertrophy of the septum. Systolic function was normal. The estimated ejection fraction was in the range of 55% to 60%. Images were inadequate for LV wall motion assessment. - Aortic valve: Valve area (Vmax): 2.48 cm^2. - Mitral valve: There was trivial regurgitation. - Left atrium: The atrium was mildly dilated. - Right ventricle: The cavity size was mildly dilated. Wall thickness was normal. Systolic function was mildly to moderately reduced. - Atrial septum: There was increased thickness of the septum, consistent with lipomatous hypertrophy. - Tricuspid valve: There was trivial regurgitation.  Disposition: Home  Diet: Heart healthy diet  Activity: Resume as tolerated   Follow-up Appts: Discharge Instructions    Diet - low sodium heart healthy    Complete by:  As directed      Discharge instructions    Complete by:  As directed   -Please followup with your primary care physician (FRY,STEPHEN A, MD) in 1 week. -If any worsening symptoms, please come back to the emergency department or go to your primary care physician for further management. -Hydrate yourself adequately on fluids (water or juice, minimize cranberry juice intake).     Increase activity slowly    Complete by:  As directed            TESTS THAT NEED FOLLOW-UP None  Time spent on discharge, talking to the patient, and coordinating care: 25 mins.   Signed: Bynum Bellows, MD 11/27/2014, 1:39 PM

## 2014-11-27 NOTE — Progress Notes (Signed)
VASCULAR LAB PRELIMINARY  PRELIMINARY  PRELIMINARY  PRELIMINARY  Carotid duplex  completed.    Preliminary report:  Bilateral:  1-39% ICA stenosis.  Vertebral artery flow is antegrade.      Onna Nodal, RVT 11/27/2014, 10:44 AM

## 2014-11-28 ENCOUNTER — Telehealth: Payer: Self-pay | Admitting: Family Medicine

## 2014-11-28 NOTE — Telephone Encounter (Signed)
Patient would like to know if Dr. Clent RidgesFry will accept him back as a patient.  [facility] and is in need of follow-up care.

## 2014-11-28 NOTE — Telephone Encounter (Signed)
Sorry but I am too full  

## 2014-11-29 NOTE — Telephone Encounter (Signed)
LMOM for patient

## 2014-12-05 ENCOUNTER — Telehealth: Payer: Self-pay | Admitting: Internal Medicine

## 2014-12-05 NOTE — Telephone Encounter (Signed)
[facility] with Dr Rennis GoldenHilty on 12/23/14.  [facility] for Dr Blanchie DessertHilty's schedule on 12/23/14.  lp

## 2014-12-23 ENCOUNTER — Encounter: Payer: Self-pay | Admitting: Internal Medicine

## 2014-12-23 ENCOUNTER — Ambulatory Visit (INDEPENDENT_AMBULATORY_CARE_PROVIDER_SITE_OTHER): Payer: Medicare Other | Admitting: Internal Medicine

## 2014-12-23 VITALS — BP 84/64 | HR 84 | Ht 72.0 in | Wt 203.1 lb

## 2014-12-23 DIAGNOSIS — R55 Syncope and collapse: Secondary | ICD-10-CM

## 2014-12-23 DIAGNOSIS — I4891 Unspecified atrial fibrillation: Secondary | ICD-10-CM

## 2014-12-23 DIAGNOSIS — I4819 Other persistent atrial fibrillation: Secondary | ICD-10-CM

## 2014-12-23 DIAGNOSIS — I951 Orthostatic hypotension: Secondary | ICD-10-CM

## 2014-12-23 DIAGNOSIS — I481 Persistent atrial fibrillation: Secondary | ICD-10-CM | POA: Diagnosis not present

## 2014-12-23 DIAGNOSIS — F411 Generalized anxiety disorder: Secondary | ICD-10-CM

## 2014-12-23 DIAGNOSIS — E785 Hyperlipidemia, unspecified: Secondary | ICD-10-CM | POA: Diagnosis not present

## 2014-12-23 DIAGNOSIS — Z01810 Encounter for preprocedural cardiovascular examination: Secondary | ICD-10-CM | POA: Diagnosis not present

## 2014-12-23 NOTE — Patient Instructions (Signed)
Your physician has requested that you have en exercise stress myoview. For further information please visit [email or web address] Please follow instruction sheet, as given.  Your physician has recommended that you wear an event monitor for 14 days. Event monitors are medical devices that record the heart's electrical activity. Doctors most often [place] these monitors to diagnose arrhythmias. Arrhythmias are problems with the speed or rhythm of the heartbeat. The monitor is a small, portable device. You can wear one while you do your normal daily activities. This is usually used to diagnose what is causing palpitations/syncope (passing out).  Dr. Rennis Golden recommends increasing your salt intake - or not avoiding salt intake - due to low blood pressure  Your physician has recommended you make the following change in your medication: decrease trazadone to  at night  Your physician recommends that you schedule a follow-up appointment after your tests & monitor  Your Doctor has ordered you to wear a heart monitor. You will wear this for 14 days.   TIPS -  REMINDERS 1. The sensor is the lanyard that is worn around your neck every day - this is powered by a battery that needs to be changed every day 2. The monitor is the device that allows you to record symptoms - this will need to be charged daily 3. The sensor & monitor need to be within 100 feet of each other at all times 4. The sensor connects to the electrodes (stickers) - these should be changed every 24-48 hours (you do not have to remove them when you bathe, just make sure they are dry when you connect it back to the sensor 5. If you need more supplies (electrodes, batteries), please call the 1-800 # on the back of the pamphlet and [employer] will mail you more supplies 6. If your skin becomes sensitive, please try the sample pack of sensitive skin electrodes (the white packet in your silver box) and call [employer] to have them mail you more of these  type of electrodes 7. When you are finish wearing the monitor, please place all supplies back in the silver box, place the silver box in the pre-packaged UPS bag and drop off at [employer] or call them so they can come pick it up   Cardiac Event Monitoring A cardiac event monitor is a small recording device used to help detect abnormal heart rhythms (arrhythmias). The monitor is used to record heart rhythm when noticeable symptoms such as the following occur:  Fast heartbeats (palpitations), such as heart racing or fluttering.  Dizziness.  Fainting or light-headedness.  Unexplained weakness. The monitor is wired to two electrodes placed on your chest. Electrodes are flat, sticky disks that attach to your skin. The monitor can be worn for up to 30 days. You will wear the monitor at all times, except when bathing.  HOW TO USE YOUR CARDIAC EVENT MONITOR A technician will prepare your chest for the electrode placement. The technician will show you how to place the electrodes, how to work the monitor, and how to replace the batteries. Take time to practice using the monitor before you leave the office. Make sure you understand how to send the information from the monitor to your health care provider. This requires a telephone with a landline, not a cell phone. You need to:  Wear your monitor at all times, except when you are in water:  Do not get the monitor wet.  Take the monitor off when bathing. Do not swim or use a hot  tub with it on.  Keep your skin clean. Do not put body lotion or moisturizer on your chest.  Change the electrodes daily or any time they stop sticking to your skin. You might need to use tape to keep them on.  It is possible that your skin under the electrodes could become irritated. To keep this from happening, try to put the electrodes in slightly different places on your chest. However, they must remain in the area under your left breast and in the upper right section of your  chest.  Make sure the monitor is safely clipped to your clothing or in a location close to your body that your health care provider recommends.  Press the button to record when you feel symptoms of heart trouble, such as dizziness, weakness, light-headedness, palpitations, thumping, shortness of breath, unexplained weakness, or a fluttering or racing heart. The monitor is always on and records what happened slightly before you pressed the button, so do not worry about being too late to get good information.  Keep a diary of your activities, such as walking, doing chores, and taking medicine. It is especially important to note what you were doing when you pushed the button to record your symptoms. This will help your health care provider determine what might be contributing to your symptoms. The information stored in your monitor will be reviewed by your health care provider alongside your diary entries.  Send the recorded information as recommended by your health care provider. It is important to understand that it will take some time for your health care provider to process the results.  Change the batteries as recommended by your health care provider. SEEK IMMEDIATE MEDICAL CARE IF:   You have chest pain.  You have extreme difficulty breathing or shortness of breath.  You develop a very fast heartbeat that persists.  You develop dizziness that does not go away.  You faint or constantly feel you are about to faint. Document Released: 07/02/2008 Document Revised: 02/07/2014 Document Reviewed: 03/22/2013 [facility] Patient Information 2015 [employer] [place]. This information is not intended to replace advice given to you by your health care provider. Make sure you discuss any questions you have with your health care provider.

## 2014-12-23 NOTE — Progress Notes (Signed)
OFFICE NOTE  Chief Complaint:  Syncope, orthostatic hypotension, preoperative risk assessment  Primary Care Physician: Judie Bonus, MD  HPI:  Jeremy King is a pleasant 64 year old male with few medical problems. He has a history of anxiety as well as dyslipidemia. Unfortunately he's had a number of syncopal episodes and has known orthostatic hypotension. He tells me that couple years ago he was seen at the [facility] and syncopized. Blood pressure was noted to be 60 over palpation. He was hospitalized for this and treated but is had recurrent hypotension. He was previously on Flomax which was discontinued. He does have a history of kidney stones with what sounds like ureteral stenting and/or lithotripsy. He probably saw a cardiologist once at the [place] and he tells me that they did not recommend any different medications. His CHADSVASC score is 1 given his age of 89 and hypertension. He has been maintained on aspirin. Recently he was seen in [facility] after a syncopal episode. He underwent cardiac workup including echocardiogram which shows normal LV function. He was noted to be in A. fib at that time. He says that it comes and goes but is not clear whether it has become more persistent. He's never had a cardioversion before. He denies significant alcohol use but has been a long-term smoker. There also is a strong family history of coronary disease in both parents. Recently he's been having problems with his knee and is contemplating left knee surgery with Dr. Ciro Backer.  It should be noted in the office today that he became dizzy after sitting up for his EKG. Orthostatic blood pressures were taken and he had a 20 point drop in systolic blood pressure with change in position.  PMHx:  Past Medical History  Diagnosis Date  . Kidney stones   . Atrial fibrillation   . Back ache   . Dysrhythmia     Past Surgical History  Procedure Laterality Date  . Lithotripsy      FAMHx:    Family History  Problem Relation Age of Onset  . Heart failure Mother   . Diabetes Father   . Heart failure Father   . Hypertension Father   . CAD Father   . Alcoholism Father   . Cancer Sister     SOCHx:   reports that he has been smoking Cigarettes.  He has been smoking about 1.00 pack per day. He has quit using smokeless tobacco. He reports that he does not drink alcohol or use illicit drugs.  ALLERGIES:  Allergies  Allergen Reactions  . Sertraline Hcl     ROS: A comprehensive review of systems was negative except for: Constitutional: positive for Syncope Cardiovascular: positive for irregular heart beat Musculoskeletal: positive for arthralgias  HOME MEDS: Current Outpatient Prescriptions  Medication Sig Dispense Refill  . Ascorbic Acid (VITAMIN C) 1000 MG tablet Take 1,000 mg by mouth daily.    [name] aspirin 325 MG tablet Take 325 mg by mouth 2 (two) times daily.    . clonazePAM (KLONOPIN) 1 MG tablet Take 1 mg by mouth 2 (two) times daily.    . fluticasone (FLONASE) 50 MCG/ACT nasal spray Place 2 sprays into both nostrils daily.     . Garlic 1000 MG CAPS Take 1 capsule by mouth daily.    [name] ibuprofen (ADVIL,MOTRIN) 200 MG tablet Take 800 mg by mouth every 6 (six) hours as needed for moderate pain (pain).    [name] loratadine (CLARITIN) 10 MG tablet Take 10  mg by mouth daily.    . multivitamin (ONE-A-DAY MEN'S) TABS tablet Take 1 tablet by mouth daily.    . Omega-3 Fatty Acids (FISH OIL) 1000 MG CAPS Take 1 capsule by mouth daily.    . traZODone (DESYREL) 100 MG tablet Take 50 mg by mouth at bedtime.     [name]. venlafaxine XR (EFFEXOR-XR) 150 MG 24 hr capsule Take 150 mg by mouth daily with breakfast.     No current facility-administered medications for this visit.    LABS/IMAGING: No results found for this or any previous visit (from the past 48 hour(s)). No results found.  VITALS: BP 84/64 mmHg  Pulse 84  Ht 6' (1.829 m)  Wt 203 lb 1.6 oz (92.126 kg)  BMI 27.54  kg/m2  EXAM: General appearance: alert and no distress Neck: no carotid bruit and no JVD Lungs: clear to auscultation bilaterally Heart: irregularly irregular rhythm Abdomen: soft, non-tender; bowel sounds normal; no masses,  no organomegaly Extremities: extremities normal, atraumatic, no cyanosis or edema Pulses: 2+ and symmetric Skin: Skin color, texture, turgor normal. No rashes or lesions Neurologic: Grossly normal Psych: Pleasant  EKG: Atrial fibrillation at 84, incomplete right bundle branch block  ASSESSMENT: 1. Atrial fibrillation, possibly persistent at this point 2. Normal LV function by recent echocardiogram 3. Recurrent syncopal episodes 4. Significant orthostatic hypotension  PLAN: 1.   Jeremy King is having significant orthostatic hypotension episodes. He is noted to be in A. fib which may be persistent. These issues need to be addressed before he can safely have surgery. He seems to be reasonably ambulatory despite his knee pain. That being said, the safest way to evaluate his heart for coronary artery disease which could be causing his orthostatic hypotension, would be an exercise treadmill stress test. He says he is willing to give that a try. Unfortunately, Lexiscan or adenosine is contra indicated due to his low blood pressure. If he is unable to walk, we would have to consider a dobutamine test. In addition, I would recommend monitoring him for 2 weeks to see if he is in persistent A. fib. Ultimately, we may want to consider cardioversion. I've asked him to liberalize salt in his diet which should help with his orthostatic hypotension. In addition he is on trazodone at night for sleep. I like to decrease that dose in half to see if it helps somewhat with his symptoms. Ultimately, he may need an alpha agonist such as midodrine to help with his low blood pressure.  Plan to see him back to discuss results of his monitor and stress test in a few weeks. Thanks for the kind  referral.  Chrystie NoseKenneth C. Hilty, MD, [facility] Attending Cardiologist [facility]  HILTY,Kenneth C 12/23/2014, 10:42 AM

## 2014-12-25 ENCOUNTER — Telehealth: Payer: Self-pay | Admitting: Cardiology

## 2014-12-25 NOTE — Telephone Encounter (Signed)
  [employer] called stating patient had an auto trigger that detected atrial fibrillation w/ RVR. Maximum rate was 173 bpm. Episode lasted 75 seconds. I contacted the patient and he admits to experiencing mild dyspnea and palpitations earlier today for a brief period of time. No syncope/ near syncope. He is currently asymptomatic. He saw Dr. Rennis GoldenHilty 12/23/14 and was noted to be in atrial fibrillation. Monitor was ordered to assess afib burden. No rate control meds ordered. Will notify Dr. Rennis GoldenHilty for further recs and his RN can f/u with patient Monday morning. Patient instructed to seek urgent care if symptoms of prolonged afib w/ hemodynamic instability. He verbalized understanding.     Lacy Taglieri 12/25/2014

## 2014-12-26 ENCOUNTER — Encounter: Payer: Self-pay | Admitting: *Deleted

## 2014-12-26 ENCOUNTER — Telehealth: Payer: Self-pay | Admitting: Internal Medicine

## 2014-12-26 NOTE — Telephone Encounter (Signed)
Spoke with patient. 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   Will show Dr. Rennis GoldenHilty today

## 2014-12-26 NOTE — Telephone Encounter (Signed)
Monitor strips reviewed by Dr. Rennis GoldenHilty. He is to consider anticoagulation for patient. Monitor strips on his cart.

## 2014-12-26 NOTE — Telephone Encounter (Signed)
Received notification from [employer] - autotrigger AF with RVR 12:13pm 193 bpm 12:38pm 165 bpm

## 2014-12-28 ENCOUNTER — Telehealth: Payer: Self-pay | Admitting: Internal Medicine

## 2014-12-28 NOTE — Telephone Encounter (Signed)
Reviewed information with D.O.D ( DR BERRY) Per Dr Allyson SabalBerry , patient needs follow up. Strips showed afib with RVR   FORWARD to Dr Rennis GoldenHilty and Eileen StanfordJenna RN  To see if patient needs next available or next time in office. Notified patient will inform him on 12/29/14

## 2014-12-28 NOTE — Telephone Encounter (Signed)
representative Kellie from [employer] She states patient called he syncopal episode , she states patient does not know how long he had fainted. No symptoms at present heart rate on strips 160   strip is being faxed for review.  RN spoke to patient,-patient states he was sitting on couch

## 2014-12-28 NOTE — Telephone Encounter (Signed)
addenum: he was sitting on couch went to go to computer He states he knocked over side table. He did not injury himself per patient.  [facility] stripis available for review  Will defer to Dr Rennis GoldenHilty and contact patient.

## 2014-12-29 ENCOUNTER — Encounter: Payer: Self-pay | Admitting: Internal Medicine

## 2014-12-29 ENCOUNTER — Telehealth: Payer: Self-pay | Admitting: Internal Medicine

## 2014-12-29 ENCOUNTER — Other Ambulatory Visit: Payer: Self-pay | Admitting: *Deleted

## 2014-12-29 ENCOUNTER — Ambulatory Visit (INDEPENDENT_AMBULATORY_CARE_PROVIDER_SITE_OTHER): Payer: Medicare Other | Admitting: Internal Medicine

## 2014-12-29 VITALS — BP 102/60 | HR 90 | Ht 72.0 in | Wt 201.1 lb

## 2014-12-29 DIAGNOSIS — I4891 Unspecified atrial fibrillation: Secondary | ICD-10-CM

## 2014-12-29 DIAGNOSIS — R5383 Other fatigue: Secondary | ICD-10-CM | POA: Diagnosis not present

## 2014-12-29 DIAGNOSIS — I481 Persistent atrial fibrillation: Secondary | ICD-10-CM

## 2014-12-29 DIAGNOSIS — R55 Syncope and collapse: Secondary | ICD-10-CM

## 2014-12-29 DIAGNOSIS — I4819 Other persistent atrial fibrillation: Secondary | ICD-10-CM

## 2014-12-29 DIAGNOSIS — D689 Coagulation defect, unspecified: Secondary | ICD-10-CM

## 2014-12-29 DIAGNOSIS — Z01812 Encounter for preprocedural laboratory examination: Secondary | ICD-10-CM

## 2014-12-29 DIAGNOSIS — I480 Paroxysmal atrial fibrillation: Secondary | ICD-10-CM | POA: Insufficient documentation

## 2014-12-29 DIAGNOSIS — I951 Orthostatic hypotension: Secondary | ICD-10-CM

## 2014-12-29 DIAGNOSIS — Z0189 Encounter for other specified special examinations: Secondary | ICD-10-CM

## 2014-12-29 MED ORDER — APIXABAN 5 MG PO TABS: 5.0000 mg | ORAL_TABLET | Freq: Two times a day (BID) | ORAL | Status: DC

## 2014-12-29 MED ORDER — DIGOXIN 250 MCG PO TABS: ORAL_TABLET | ORAL | Status: DC

## 2014-12-29 NOTE — Progress Notes (Signed)
OFFICE NOTE  Chief Complaint:  Follow-up monitor  Primary Care Physician: Judie BonusKollar, Elizabeth A, MD  HPI:  Jeremy King is a pleasant 64 year old male with few medical problems. He has a history of anxiety as well as dyslipidemia. Unfortunately he's had a number of syncopal episodes and has known orthostatic hypotension. He tells me that couple years ago he was seen at [facility] and syncopized. Blood pressure was noted to be 60 over palpation. He was hospitalized for this and treated but is had recurrent hypotension. He was previously on Flomax which was discontinued. He does have a history of kidney stones with what sounds like ureteral stenting and/or lithotripsy. He [facility] and he tells me that they did not recommend any different medications. His CHADSVASC score is 1 given his age of 64 and hypertension. He has been maintained on aspirin. [facility] sync[facility]. He underwent cardiac workup including echocardiogram which shows normal LV function. He was noted to be in A. fib at that time. He says that it comes and goes but is not clear whether it has become more persistent. He's never had a cardioversion before. He denies significant alcohol use but has been a long-term smoker. There also is a strong family history of coronary disease in both parents. Recently he's been having problems with his knee and is contemplating left knee surgery with Dr. Ciro BackerGiofree.  It should be noted in the office today that he became dizzy after sitting up for his EKG. Orthostatic blood pressures were taken and he had a 20 point drop in systolic blood pressure with change in position.  I saw Jeremy King back in the office today. Unfortunately his home monitor has been alarming as he's had frequent atrial fibrillation with rapid ventricular response. Heart rates have been up into the 180s. He also had a near-syncopal episode. He  unfortunately did not check his blood pressure at that time. He's been pretty much in persistent A. fib since we placed the monitor. I think this is at least contributing to his symptoms. 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 Exam is not consistent with heart failure. Blood pressure is slightly improved today at 100 systolic, however it is limited any AV nodal blocking medications. He's currently only on aspirin because of a low CHADSVASC score.  PMHx:  Past Medical History  Diagnosis Date  . Kidney stones   . Atrial fibrillation   . Back ache   . Dysrhythmia     Past Surgical History  Procedure Laterality Date  . Lithotripsy      FAMHx:  Family History  Problem Relation Age of Onset  . Heart failure Mother     mitral valve disease  . Diabetes Father   . Heart failure Father     MI  . Hypertension Father   . CAD Father   . Alcoholism Father   . Cancer Sister     SOCHx:   reports that he has been smoking Cigarettes.  He has been smoking about 1.00 pack per day. He has quit using smokeless tobacco. He reports that he does not drink alcohol or use illicit drugs.  ALLERGIES:  Allergies  Allergen Reactions  . Sertraline Hcl     ROS: A comprehensive review of systems was negative except for: Constitutional: positive for Syncope Cardiovascular: positive for irregular heart beat Musculoskeletal: positive for arthralgias  HOME MEDS: Current  Outpatient Prescriptions  Medication Sig Dispense Refill  . apixaban (ELIQUIS) 5 MG TABS tablet Take 1 tablet (5 mg total) by mouth 2 (two) times daily. 60 tablet 6  . Ascorbic Acid (VITAMIN C) 1000 MG tablet Take 1,000 mg by mouth daily.    . clonazePAM (KLONOPIN) 1 MG tablet Take 1 mg by mouth 2 (two) times daily.    . digoxin (LANOXIN) 0.25 MG tablet Take 2 tablets (0.5mg ) on 12/29/14 then decrease to 1 tablet by mouth daily. 31 tablet 6  . fluticasone (FLONASE) 50 MCG/ACT nasal spray Place 2 sprays into both nostrils  daily.     . Garlic 1000 MG CAPS Take 1 capsule by mouth daily.    [name] ibuprofen (ADVIL,MOTRIN) 200 MG tablet Take 800 mg by mouth every 6 (six) hours as needed for moderate pain (pain).    [name] loratadine (CLARITIN) 10 MG tablet Take 10 mg by mouth daily.    . multivitamin (ONE-A-DAY MEN'S) TABS tablet Take 1 tablet by mouth daily.    . Omega-3 Fatty Acids (FISH OIL) 1000 MG CAPS Take 1 capsule by mouth daily.    . traZODone (DESYREL) 100 MG tablet Take 50 mg by mouth at bedtime.     [name] venlafaxine XR (EFFEXOR-XR) 150 MG 24 hr capsule Take 150 mg by mouth daily with breakfast.     No current facility-administered medications for this visit.    LABS/IMAGING: No results found for this or any previous visit (from the past 48 hour(s)). No results found.  VITALS: BP 102/60 mmHg  Pulse 90  Ht 6' (1.829 m)  Wt 201 lb 1.6 oz (91.218 kg)  BMI 27.27 kg/m2  EXAM: Deferred  EKG: Monitor reviewed showing A. fib with RVR  ASSESSMENT: 1. Atrial fibrillation with RVR-near-syncope 2. Normal LV function by recent echocardiogram 3. Recurrent syncopal episodes 4. Significant orthostatic hypotension  PLAN: 1.   Jeremy King has persistent A. fib with periods of rapid ventricular response into the 180s. He's also had persistent syncope and near syncope with orthostatic hypotension. Some of his symptoms have improved with a reduction in his trazodone. He reports that one night he stopped taking the medicine and felt much better the next day. I've asked him to discontinue it. In order to get better rate control of his A. fib I recommend starting him on digoxin 0.5 mg today and then 0.25 mg daily thereafter. In addition will start him on Eliquis today at 5 mg twice a day. Plan is to get 2 doses in him today and an additional dose tomorrow. I've asked him to stop his aspirin. I would like for him to have TEE cardioversion to try to establish normal rhythm with. Will plan to try to accomplish that as an outpatient  tomorrow, however I been informed by scheduling that because it's a holiday, they may only be doing inpatient cases. Because he continues to be symptomatic with RVR, this is an urgent matter and he may need to be admitted for rate control and inpatient cardioversion.  Chrystie Nose, MD, [facility] Attending Cardiologist [facility]  King,Jeremy C 12/29/2014, 2:41 PM

## 2014-12-29 NOTE — Telephone Encounter (Signed)
Patient scheduled to see Dr. Rennis GoldenHilty at 2pm 3/24

## 2014-12-29 NOTE — Telephone Encounter (Signed)
Received alert from monitor, auto trigger of atrial fib with rate 189. Patient is seeing dr hilty today.

## 2014-12-29 NOTE — Patient Instructions (Addendum)
Dr. Rennis Golden has ordered a TEE/Cardioversion TOMORROW 12/30/14 - to get your heart into a normal rhythm  MEDICATION CHANGES 1. STOP aspirin 2. START eliquis  twice daily 3. START digoxin  >> TODAY 12/29/14 you will take two 0.25mg  tablets  >> Starting TOMORROW 12/30/14 you will take one 0.25mg  tablet  Please have lab work TODAY (there is a lab on the first floor of this building - first hallway on right - [address])  Transesophageal Echocardiogram Transesophageal echocardiography (TEE) is a picture test of your heart using sound waves. The pictures taken can give very detailed pictures of your heart. This can help your doctor see if there are problems with your heart. TEE can check:  If your heart has blood clots in it.  How well your heart valves are working.  If you have an infection on the inside of your heart.  Some of the major arteries of your heart.  If your heart valve is working after a Psychologist, forensic.  Your heart before a procedure that uses a shock to your heart to get the rhythm back to normal. BEFORE THE PROCEDURE  Do not eat or drink for 6 hours before the procedure or as told by your doctor.  Make plans to have someone drive you home after the procedure. Do not drive yourself home.  An IV tube will be put in your arm. PROCEDURE  You will be given a medicine to help you relax (sedative). It will be given through the IV tube.  A numbing medicine will be sprayed or gargled in the back of your throat to help numb it.  The tip of the probe is placed into the back of your mouth. You will be asked to swallow. This helps to pass the probe into your esophagus.  Once the tip of the probe is in the right place, your doctor can take pictures of your heart.  You may feel pressure at the back of your throat. AFTER THE PROCEDURE  You will be taken to a recovery area so the sedative can wear off.  Your throat may be sore and scratchy. This will go away slowly over time.  You will  go home when you are fully awake and able to swallow liquids.  You should have someone stay with you for the next 24 hours.  Do not drive or operate machinery for the next 24 hours. Document Released: 07/21/2009 Document Revised: 09/28/2013 Document Reviewed: 03/25/2013 [facility] Patient Information 2015 [place], [place]. This information is not intended to replace advice given to you by your health care provider. Make sure you discuss any questions you have with your health care provider.   Electrical Cardioversion Electrical cardioversion is the delivery of a jolt of electricity to change the rhythm of the heart. Sticky patches or metal paddles are placed on the chest to deliver the electricity from a device. This is done to restore a normal rhythm. A rhythm that is too fast or not regular keeps the heart from pumping well. Electrical cardioversion is done in an emergency if:   There is low or no blood pressure as a result of the heart rhythm.   Normal rhythm must be restored as fast as possible to protect the brain and heart from further damage.   It may save a life. Cardioversion may be done for heart rhythms that are not immediately life threatening, such as atrial fibrillation or flutter, in which:   The heart is beating too fast or is not regular.  Medicine to change the rhythm has not worked.   It is safe to wait in order to allow time for preparation.  Symptoms of the abnormal rhythm are bothersome.  The risk of stroke and other serious problems can be reduced. LET [facility]OUR HEALTH CARE PROVIDER KNOW ABOUT:   Any allergies you have.  All medicines you are taking, including vitamins, herbs, eye drops, creams, and over-the-counter medicines.  Previous problems you or members of your family have had with the use of anesthetics.   Any blood disorders you have.   Previous surgeries you have had.   Medical conditions you have. RISKS AND COMPLICATIONS  Generally, this is  a safe procedure. However, problems can occur and include:   Breathing problems related to the anesthetic used.  A blood clot that breaks free and travels to other parts of your body. This could cause a stroke or other problems. The risk of this is lowered by use of blood-thinning medicine (anticoagulant) prior to the procedure.  Cardiac arrest (rare). BEFORE THE PROCEDURE   You may have tests to detect blood clots in your heart and to evaluate heart function.  You may start taking anticoagulants so your blood does not clot as easily.   Medicines may be given to help stabilize your heart rate and rhythm. PROCEDURE  You will be given medicine through an IV tube to reduce discomfort and make you sleepy (sedative).   An electrical shock will be delivered. AFTER THE PROCEDURE Your heart rhythm will be watched to make sure it does not change.  Document Released: 09/13/2002 Document Revised: 02/07/2014 Document Reviewed: 04/07/2013 [facility] Patient Information 2015 [place]. This information is not intended to replace advice given to you by your health care provider. Make sure you discuss any questions you have with your health care provider.   Medication samples have been provided to the patient.  Drug name: eliquis 5mg   Qty: 56  LOT: [id number]  Exp.Date: 02/2017  Samples left at front desk for patient pick-up. Patient notified.  Julaine Fusilkins, Dadrian Ballantine M 4:23 PM 12/29/2014

## 2014-12-30 ENCOUNTER — Encounter (HOSPITAL_COMMUNITY)
Admission: RE | Disposition: A | Discharge status: Home / Self Care | Payer: Self-pay | Source: Ambulatory Visit | Attending: Internal Medicine

## 2014-12-30 ENCOUNTER — Ambulatory Visit (HOSPITAL_COMMUNITY): Payer: Medicare Other | Admitting: Anesthesiology

## 2014-12-30 ENCOUNTER — Encounter (HOSPITAL_COMMUNITY): Payer: Self-pay | Admitting: Anesthesiology

## 2014-12-30 ENCOUNTER — Ambulatory Visit (HOSPITAL_COMMUNITY)
Admission: RE | Admit: 2014-12-30 | Discharge: 2014-12-30 | Disposition: A | Discharge status: Home / Self Care | Payer: Medicare Other | Source: Ambulatory Visit | Attending: Internal Medicine | Admitting: Internal Medicine

## 2014-12-30 DIAGNOSIS — F17210 Nicotine dependence, cigarettes, uncomplicated: Secondary | ICD-10-CM | POA: Diagnosis not present

## 2014-12-30 DIAGNOSIS — I4891 Unspecified atrial fibrillation: Secondary | ICD-10-CM | POA: Diagnosis not present

## 2014-12-30 DIAGNOSIS — R55 Syncope and collapse: Secondary | ICD-10-CM | POA: Diagnosis present

## 2014-12-30 DIAGNOSIS — I4510 Unspecified right bundle-branch block: Secondary | ICD-10-CM | POA: Insufficient documentation

## 2014-12-30 DIAGNOSIS — I340 Nonrheumatic mitral (valve) insufficiency: Secondary | ICD-10-CM | POA: Diagnosis not present

## 2014-12-30 DIAGNOSIS — Z0189 Encounter for other specified special examinations: Secondary | ICD-10-CM

## 2014-12-30 DIAGNOSIS — I959 Hypotension, unspecified: Secondary | ICD-10-CM | POA: Diagnosis present

## 2014-12-30 DIAGNOSIS — I480 Paroxysmal atrial fibrillation: Secondary | ICD-10-CM | POA: Diagnosis present

## 2014-12-30 HISTORY — PX: TEE WITHOUT CARDIOVERSION: SHX5443

## 2014-12-30 HISTORY — PX: CARDIOVERSION: SHX1299

## 2014-12-30 LAB — CBC
HCT: 46.8 % (ref 39.0–52.0)
Hemoglobin: 16.1 g/dL (ref 13.0–17.0)
MCH: 32.8 pg (ref 26.0–34.0)
MCHC: 34.4 g/dL (ref 30.0–36.0)
MCV: 95.3 fL (ref 78.0–100.0)
MPV: 9.9 fL (ref 8.6–12.4)
Platelets: 271 10*3/uL (ref 150–400)
RBC: 4.91 MIL/uL (ref 4.22–5.81)
RDW: 13.3 % (ref 11.5–15.5)
WBC: 8.7 10*3/uL (ref 4.0–10.5)

## 2014-12-30 LAB — BASIC METABOLIC PANEL
BUN: 22 mg/dL (ref 6–23)
CO2: 23 meq/L (ref 19–32)
CREATININE: 1.14 mg/dL (ref 0.50–1.35)
Calcium: 9.6 mg/dL (ref 8.4–10.5)
Chloride: 105 mEq/L (ref 96–112)
Glucose, Bld: 81 mg/dL (ref 70–99)
Potassium: 4.6 mEq/L (ref 3.5–5.3)
SODIUM: 139 meq/L (ref 135–145)

## 2014-12-30 LAB — PROTIME-INR
INR: 0.97 (ref <1.50)
PROTHROMBIN TIME: 12.9 s (ref 11.6–15.2)

## 2014-12-30 LAB — TSH: TSH: 1.316 u[IU]/mL (ref 0.350–4.500)

## 2014-12-30 LAB — APTT: aPTT: 38 seconds — ABNORMAL HIGH (ref 24–37)

## 2014-12-30 SURGERY — CARDIOVERSION
Anesthesia: General

## 2014-12-30 MED ORDER — PROPOFOL INFUSION 10 MG/ML OPTIME
INTRAVENOUS | Status: DC | PRN
  Administered 2014-12-30: 50 ug/kg/min via INTRAVENOUS

## 2014-12-30 MED ORDER — SODIUM CHLORIDE 0.9 % IV SOLN
INTRAVENOUS | Status: DC
  Administered 2014-12-30: 500 mL via INTRAVENOUS

## 2014-12-30 MED ORDER — SODIUM CHLORIDE 0.9 % IV SOLN: INTRAVENOUS | Status: DC

## 2014-12-30 MED ORDER — PROPOFOL 10 MG/ML IV BOLUS
INTRAVENOUS | Status: DC | PRN
  Administered 2014-12-30: 25 mg via INTRAVENOUS

## 2014-12-30 MED ORDER — HYDROMORPHONE HCL 1 MG/ML IJ SOLN: 0.2500 mg | INTRAMUSCULAR | Status: DC | PRN

## 2014-12-30 MED ORDER — SODIUM CHLORIDE 0.9 % IJ SOLN: 3.0000 mL | INTRAMUSCULAR | Status: DC | PRN

## 2014-12-30 MED ORDER — ONDANSETRON HCL 4 MG/2ML IJ SOLN: 4.0000 mg | Freq: Once | INTRAMUSCULAR | Status: DC | PRN

## 2014-12-30 MED ORDER — LIDOCAINE VISCOUS 2 % MT SOLN
OROMUCOSAL | Status: AC
  Filled 2014-12-30: qty 15

## 2014-12-30 NOTE — Anesthesia Procedure Notes (Signed)
Procedure Name: MAC Date/Time: 12/30/2014 11:45 AM Performed by: Gwenyth AllegraADAMI, Xoey Warmoth Pre-anesthesia Checklist: Patient identified, Emergency Drugs available, Suction available and Patient being monitored Patient Re-evaluated:Patient Re-evaluated prior to inductionOxygen Delivery Method: Nasal cannula Preoxygenation: Pre-oxygenation with 100% oxygen

## 2014-12-30 NOTE — CV Procedure (Signed)
TEE/CARDIOVERSION NOTE  TRANSESOPHAGEAL ECHOCARDIOGRAM (TEE):  Indictation: Atrial Fibrillation  Consent:   Informed consent was obtained prior to the procedure. The risks, benefits and alternatives for the procedure were discussed and the patient comprehended these risks.  Risks include, but are not limited to, cough, sore throat, vomiting, nausea, somnolence, esophageal and stomach trauma or perforation, bleeding, low blood pressure, aspiration, pneumonia, infection, trauma to the teeth and death.    Time Out: Verified patient identification, verified procedure, site/side was marked, verified correct patient position, special equipment/implants available, medications/allergies/relevent history reviewed, required imaging and test results available. Performed  Procedure:  After a procedural time-out, the patient was given Propofol per anesthesia for moderate sedation.  The oropharynx was anesthetized 10 cc of topical 1% viscous lidocaine and 2 cetacaine sprays.  The transesophageal probe was inserted in the esophagus and stomach without difficulty and multiple views were obtained.  The patient was kept under observation until the patient left the procedure room.  The patient left the procedure room in stable condition.   Agitated microbubble saline contrast was administered.  Complications:    Complications: None Patient did tolerate procedure well.  Findings:  1. LEFT VENTRICLE: The left ventricular wall thickness is normal.  The left ventricular cavity is normal in size. Wall motion is normal.  LVEF is 55-60%.  2. RIGHT VENTRICLE:  The right ventricle is normal in structure and function without any thrombus or masses.    3. LEFT ATRIUM:  The left atrium is mildly dilated in size without any thrombus or masses.  There is not spontaneous echo contrast ("smoke") in the left atrium consistent with a low flow state.  4. LEFT ATRIAL APPENDAGE:  The left atrial appendage is free of  any thrombus or masses. The appendage has single lobes. Pulse doppler indicates high flow in the appendage.  5. ATRIAL SEPTUM:  The atrial septum was noted to have a small PFO. There is a small amount of left to right shunting by color doppler. There is no evidence for right to left shunting by saline microbubble.  6. RIGHT ATRIUM:  The right atrium is normal in size and function without any thrombus or masses.  7. MITRAL VALVE:  The mitral valve is normal in structure and function with Mild regurgitation.  There were no vegetations or stenosis.  8. AORTIC VALVE:  The aortic valve is trileaflet, normal in structure and function with no regurgitation.  There were no vegetations or stenosis  9. TRICUSPID VALVE:  The tricuspid valve is normal in structure and function with trivial regurgitation.  There were no vegetations or stenosis  10.  PULMONIC VALVE:  The pulmonic valve is normal in structure and function with no regurgitation.  There were no vegetations or stenosis.   11. AORTIC ARCH, ASCENDING AND DESCENDING AORTA:  There was no atherosclerosis of the ascending aorta, aortic arch, or proximal descending aorta.  12. PULMONARY VEINS: Anomalous pulmonary venous return was not noted.  13. PERICARDIUM: The pericardium appeared normal and non-thickened.  There is no pericardial effusion.  CARDIOVERSION:     Second Time Out: Verified patient identification, verified procedure, site/side was marked, verified correct patient position, special equipment/implants available, medications/allergies/relevent history reviewed, required imaging and test results available.  Performed  Procedure:  1. Patient placed on cardiac monitor, pulse oximetry, supplemental oxygen as necessary.  2. Sedation administered per anesthesia 3. Pacer pads placed anterior and posterior chest. 4. Cardioverted 1 time(s).  5. Cardioverted at 150J biphasic.  Complications:  Complications: None Patient did tolerate  procedure well.  Impression:  1. NO LAA thrombus 2. Mildly dilated LA 3. Normal LV function  4. Mild MR, trace TR 5. Small unidirectional PFO with left to right flow by color doppler, no right to left flow 6. Successful DCCV to NSR with a single 150J biphasic shock.  Recommendations:  1. Continue Eliquis 5 mg BID - the next scheduled dose is this evening. 2. Continue digoxin 0.25 mg daily.  Will check BMET and digoxin level next week. 3. Continue to hold trazadone, as it seems to be contributing to orthostatic hypotension. 4. If bp allows, may switch digoxin ultimately over to b-blocker. 5. Will need follow-up with me in 1-2 weeks.  He should continue to wear his cardionet monitor.  Time Spent Directly with the Patient:  45 minutes   Chrystie NoseKenneth C. Haily Caley, MD, [facility]ACC Attending Cardiologist [facility]  12/30/2014, 12:12 PM

## 2014-12-30 NOTE — Discharge Instructions (Signed)
Electrical Cardioversion, Care After °Refer to this sheet in the next few weeks. These instructions provide you with information on caring for yourself after your procedure. Your health care provider may also give you more specific instructions. Your treatment has been planned according to current medical practices, but problems sometimes occur. Call your health care provider if you have any problems or questions after your procedure. °WHAT TO EXPECT AFTER THE PROCEDURE °After your procedure, it is typical to have the following sensations: °· Some redness on the skin where the shocks were delivered. If this is tender, a sunburn lotion or hydrocortisone cream may help. °· Possible return of an abnormal heart rhythm within hours or days after the procedure. °HOME CARE INSTRUCTIONS °· Take medicines only as directed by your health care provider. Be sure you understand how and when to take your medicine. °· Learn how to feel your pulse and check it often. °· Limit your activity for 48 hours after the procedure or as directed by your health care provider. °· Avoid or minimize caffeine and other stimulants as directed by your health care provider. °SEEK MEDICAL CARE IF: °· You feel like your heart is beating too fast or your pulse is not regular. °· You have any questions about your medicines. °· You have bleeding that will not stop. °SEEK IMMEDIATE MEDICAL CARE IF: °· You are dizzy or feel faint. °· It is hard to breathe or you feel short of breath. °· There is a change in discomfort in your chest. °· Your speech is slurred or you have trouble moving an arm or leg on one side of your body. °· You get a serious muscle cramp that does not go away. °· Your fingers or toes turn cold or blue. °Document Released: 07/14/2013 Document Revised: 02/07/2014 Document Reviewed: 07/14/2013 °ExitCare® Patient Information ©2015 [employer]. This information is not intended to replace advice given to you by your health care provider.  Make sure you discuss any questions you have with your health care provider. ° ° °Conscious Sedation, Adult, Care After °Refer to this sheet in the next few weeks. These instructions provide you with information on caring for yourself after your procedure. Your health care provider may also give you more specific instructions. Your treatment has been planned according to current medical practices, but problems sometimes occur. Call your health care provider if you have any problems or questions after your procedure. °WHAT TO EXPECT AFTER THE PROCEDURE  °After your procedure: °· You may feel sleepy, clumsy, and have poor balance for several hours. °· Vomiting may occur if you eat too soon after the procedure. °HOME CARE INSTRUCTIONS °· Do not participate in any activities where you could become injured for at least 24 hours. Do not: °¨ Drive. °¨ Swim. °¨ Ride a bicycle. °¨ Operate heavy machinery. °¨ Cook. °¨ Use power tools. °¨ Climb ladders. °¨ Work from a high place. °· Do not make important decisions or sign legal documents until you are improved. °· If you vomit, drink water, juice, or soup when you can drink without vomiting. Make sure you have little or no nausea before eating solid foods. °· Only take over-the-counter or prescription medicines for pain, discomfort, or fever as directed by your health care provider. °· Make sure you and your family fully understand everything about the medicines given to you, including what side effects may occur. °· You should not drink alcohol, take sleeping pills, or take medicines that cause drowsiness for at least 24   hours. °· If you smoke, do not smoke without supervision. °· If you are feeling better, you may resume normal activities 24 hours after you were sedated. °· Keep all appointments with your health care provider. °SEEK MEDICAL CARE IF: °· Your skin is pale or bluish in color. °· You continue to feel nauseous or vomit. °· Your pain is getting worse and is not  helped by medicine. °· You have bleeding or swelling. °· You are still sleepy or feeling clumsy after 24 hours. °SEEK IMMEDIATE MEDICAL CARE IF: °· You develop a rash. °· You have difficulty breathing. °· You develop any type of allergic problem. °· You have a fever. °MAKE SURE YOU: °· Understand these instructions. °· Will watch your condition. °· Will get help right away if you are not doing well or get worse. °Document Released: 07/14/2013 Document Reviewed: 07/14/2013 °ExitCare® Patient Information ©2015 [employer]. This information is not intended to replace advice given to you by your health care provider. Make sure you discuss any questions you have with your health care provider. ° °

## 2014-12-30 NOTE — Transfer of Care (Signed)
Immediate Anesthesia Transfer of Care Note  Patient: Jeremy King  Procedure(s) Performed: Procedure(s): CARDIOVERSION (N/A)  Patient Location: PACU  Anesthesia Type:MAC  Level of Consciousness: awake, alert  and oriented  Airway & Oxygen Therapy: Patient Spontanous Breathing and Patient connected to nasal cannula oxygen  Post-op Assessment: Report given to RN and Post -op Vital signs reviewed and stable  Post vital signs: Reviewed and stable  Last Vitals:  Filed Vitals:   12/30/14 1202  BP: 0/0    Complications: No apparent anesthesia complications

## 2014-12-30 NOTE — Progress Notes (Signed)
Echocardiogram Echocardiogram Transesophageal has been performed.  Jeremy King 12/30/2014, 1:09 PM

## 2014-12-30 NOTE — H&P (Signed)
     INTERVAL PROCEDURE H&P  History and Physical Interval Note:  12/30/2014 12:07 PM  Khian R [name] has presented today for their planned procedure. The various methods of treatment have been discussed with the patient and family. After consideration of risks, benefits and other options for treatment, the patient has consented to the procedure.  The patients' outpatient history has been reviewed, patient examined, and no change in status from most recent office note within the past 30 days. I have reviewed the patients' chart and labs and will proceed as planned. Questions were answered to the patient's satisfaction.   Chrystie NoseKenneth C. Enyla Lisbon, MD, [facility] Center[facility]ACC Attending Cardiologist [facility]  Chrystie NoseHILTY,Elenna Spratling C 12/30/2014

## 2014-12-30 NOTE — Anesthesia Preprocedure Evaluation (Signed)
Anesthesia Evaluation  Patient identified by MRN, date of birth, ID band Patient awake    Reviewed: Allergy & Precautions, NPO status , Patient's Chart, lab work & pertinent test results  Airway        Dental   Pulmonary Current Smoker,          Cardiovascular + dysrhythmias Atrial Fibrillation     Neuro/Psych  Headaches,    GI/Hepatic   Endo/Other    Renal/GU Renal disease     Musculoskeletal   Abdominal   Peds  Hematology   Anesthesia Other Findings   Reproductive/Obstetrics                             Anesthesia Physical Anesthesia Plan  ASA: III  Anesthesia Plan: General   Post-op Pain Management:    Induction: Intravenous  Airway Management Planned: Mask  Additional Equipment:   Intra-op Plan:   Post-operative Plan:   Informed Consent: I have reviewed the patients History and Physical, chart, labs and discussed the procedure including the risks, benefits and alternatives for the proposed anesthesia with the patient or authorized representative who has indicated his/her understanding and acceptance.     Plan Discussed with: CRNA, Anesthesiologist and Surgeon  Anesthesia Plan Comments:         Anesthesia Quick Evaluation

## 2014-12-30 NOTE — Anesthesia Postprocedure Evaluation (Signed)
  Anesthesia Post-op Note  Patient: Jeremy King  Procedure(s) Performed: Procedure(s): CARDIOVERSION (N/A)  Patient Location: PACU  Anesthesia Type:General  Level of Consciousness: awake, alert , oriented and patient cooperative  Airway and Oxygen Therapy: Patient Spontanous Breathing  Post-op Pain: none  Post-op Assessment: Post-op Vital signs reviewed, Patient's Cardiovascular Status Stable, Respiratory Function Stable, Patent Airway, No signs of Nausea or vomiting and Pain level controlled  Post-op Vital Signs: stable  Last Vitals:  Filed Vitals:   12/30/14 1220  BP: 112/80  Pulse: 55  Temp:   Resp: 21    Complications: No apparent anesthesia complications

## 2015-01-02 ENCOUNTER — Encounter (HOSPITAL_COMMUNITY): Payer: Self-pay | Admitting: Internal Medicine

## 2015-01-03 ENCOUNTER — Ambulatory Visit (INDEPENDENT_AMBULATORY_CARE_PROVIDER_SITE_OTHER): Payer: Medicare Other | Admitting: Internal Medicine

## 2015-01-03 ENCOUNTER — Encounter: Payer: Self-pay | Admitting: Internal Medicine

## 2015-01-03 VITALS — BP 108/58 | HR 54 | Temp 98.4°F | Resp 12 | Ht 72.0 in | Wt 204.0 lb

## 2015-01-03 DIAGNOSIS — I481 Persistent atrial fibrillation: Secondary | ICD-10-CM

## 2015-01-03 DIAGNOSIS — F411 Generalized anxiety disorder: Secondary | ICD-10-CM | POA: Diagnosis not present

## 2015-01-03 DIAGNOSIS — E785 Hyperlipidemia, unspecified: Secondary | ICD-10-CM

## 2015-01-03 DIAGNOSIS — I4819 Other persistent atrial fibrillation: Secondary | ICD-10-CM

## 2015-01-03 NOTE — Patient Instructions (Signed)
We do not need to take any blood work today. Call the office if you need refills on anything.   We will see you back in about 6 months to see how you are doing with the knee. If you have problems or questions before then please feel free to call the office.   We will watch in the computer to see how the stress test goes.   Health Maintenance A healthy lifestyle and preventative care can promote health and wellness.  Maintain regular health, dental, and eye exams.  Eat a healthy diet. Foods like vegetables, fruits, whole grains, low-fat dairy products, and lean protein foods contain the nutrients you need and are low in calories. Decrease your intake of foods high in solid fats, added sugars, and salt. Get information about a proper diet from your health care provider, if necessary.  Regular physical exercise is one of the most important things you can do for your health. Most adults should get at least 150 minutes of moderate-intensity exercise (any activity that increases your heart rate and causes you to sweat) each week. In addition, most adults need muscle-strengthening exercises on 2 or more days a week.   Maintain a healthy weight. The body mass index (BMI) is a screening tool to identify possible weight problems. It provides an estimate of body fat based on height and weight. Your health care provider can find your BMI and can help you achieve or maintain a healthy weight. For males 20 years and older:  A BMI below 18.5 is considered underweight.  A BMI of 18.5 to 24.9 is normal.  A BMI of 25 to 29.9 is considered overweight.  A BMI of 30 and above is considered obese.  Maintain normal blood lipids and cholesterol by exercising and minimizing your intake of saturated fat. Eat a balanced diet with plenty of fruits and vegetables. Blood tests for lipids and cholesterol should begin at age 34 and be repeated every 5 years. If your lipid or cholesterol levels are high, you are over age  53, or you are at high risk for heart disease, you may need your cholesterol levels checked more frequently.Ongoing high lipid and cholesterol levels should be treated with medicines if diet and exercise are not working.  If you smoke, find out from your health care provider how to quit. If you do not use tobacco, do not start.  Lung cancer screening is recommended for adults aged 55-80 years who are at high risk for developing lung cancer because of a history of smoking. A yearly low-dose CT scan of the lungs is recommended for people who have at least a 30-pack-year history of smoking and are current smokers or have quit within the past 15 years. A pack year of smoking is smoking an average of 1 pack of cigarettes a day for 1 year (for example, a 30-pack-year history of smoking could mean smoking 1 pack a day for 30 years or 2 packs a day for 15 years). Yearly screening should continue until the smoker has stopped smoking for at least 15 years. Yearly screening should be stopped for people who develop a health problem that would prevent them from having lung cancer treatment.  If you choose to drink alcohol, do not have more than 2 drinks per day. One drink is considered to be 12 oz (360 mL) of beer, 5 oz (150 mL) of wine, or 1.5 oz (45 mL) of liquor.  Avoid the use of street drugs. Do not share  needles with anyone. Ask for help if you need support or instructions about stopping the use of drugs.  High blood pressure causes heart disease and increases the risk of stroke. Blood pressure should be checked at least every 1-2 years. Ongoing high blood pressure should be treated with medicines if weight loss and exercise are not effective.  If you are 71-44 years old, ask your health care provider if you should take aspirin to prevent heart disease.  Diabetes screening involves taking a blood sample to check your fasting blood sugar level. This should be done once every 3 years after age 29 if you are at  a normal weight and without risk factors for diabetes. Testing should be considered at a younger age or be carried out more frequently if you are overweight and have at least 1 risk factor for diabetes.  Colorectal cancer can be detected and often prevented. Most routine colorectal cancer screening begins at the age of 76 and continues through age 64. However, your health care provider may recommend screening at an earlier age if you have risk factors for colon cancer. On a yearly basis, your health care provider may provide home test kits to check for hidden blood in the stool. A small camera at the end of a tube may be used to directly examine the colon (sigmoidoscopy or colonoscopy) to detect the earliest forms of colorectal cancer. Talk to your health care provider about this at age 72 when routine screening begins. A direct exam of the colon should be repeated every 5-10 years through age 4, unless early forms of precancerous polyps or small growths are found.  People who are at an increased risk for hepatitis B should be screened for this virus. You are considered at high risk for hepatitis B if:  You were born in a country where hepatitis B occurs often. Talk with your health care provider about which countries are considered high risk.  Your parents were born in a high-risk country and you have not received a shot to protect against hepatitis B (hepatitis B vaccine).  You have HIV or AIDS.  You use needles to inject street drugs.  You live with, or have sex with, someone who has hepatitis B.  You are a man who has sex with other men (MSM).  You get hemodialysis treatment.  You take certain medicines for conditions like cancer, organ transplantation, and autoimmune conditions.  Hepatitis C blood testing is recommended for all people born from [birth year] through 1967 and any individual with known risk factors for hepatitis C.  Healthy men should no longer receive prostate-specific antigen  (PSA) blood tests as part of routine cancer screening. Talk to your health care provider about prostate cancer screening.  Testicular cancer screening is not recommended for adolescents or adult males who have no symptoms. Screening includes self-exam, a health care provider exam, and other screening tests. Consult with your health care provider about any symptoms you have or any concerns you have about testicular cancer.  Practice safe sex. Use condoms and avoid high-risk sexual practices to reduce the spread of sexually transmitted infections (STIs).  You should be screened for STIs, including gonorrhea and chlamydia if:  You are sexually active and are younger than 24 years.  You are older than 24 years, and your health care provider tells you that you are at risk for this type of infection.  Your sexual activity has changed since you were last screened, and you are at an increased  risk for chlamydia or gonorrhea. Ask your health care provider if you are at risk.  If you are at risk of being infected with HIV, it is recommended that you take a prescription medicine daily to prevent HIV infection. This is called pre-exposure prophylaxis (PrEP). You are considered at risk if:  You are a man who has sex with other men (MSM).  You are a heterosexual man who is sexually active with multiple partners.  You take drugs by injection.  You are sexually active with a partner who has HIV.  Talk with your health care provider about whether you are at high risk of being infected with HIV. If you choose to begin PrEP, you should first be tested for HIV. You should then be tested every 3 months for as long as you are taking PrEP.  Use sunscreen. Apply sunscreen liberally and repeatedly throughout the day. You should seek shade when your shadow is shorter than you. Protect yourself by wearing long sleeves, pants, a wide-brimmed hat, and sunglasses year round whenever you are outdoors.  Tell your health  care provider of new moles or changes in moles, especially if there is a change in shape or color. Also, tell your health care provider if a mole is larger than the size of a pencil eraser.  A one-time screening for abdominal aortic aneurysm (AAA) and surgical repair of large AAAs by ultrasound is recommended for men aged 65-75 years who are current or former smokers.  Stay current with your vaccines (immunizations). Document Released: 03/21/2008 Document Revised: 09/28/2013 Document Reviewed: 02/18/2011 [facility] CenterExit[facility] Patient Information 2015 [place], [place]. This information is not intended to replace advice given to you by your health care provider. Make sure you discuss any questions you have with your health care provider.

## 2015-01-03 NOTE — Progress Notes (Signed)
Pre visit review using our clinic review tool, if applicable. No additional management support is needed unless otherwise documented below in the visit note. 

## 2015-01-04 ENCOUNTER — Telehealth: Payer: Self-pay | Admitting: *Deleted

## 2015-01-04 DIAGNOSIS — Z79899 Other long term (current) drug therapy: Secondary | ICD-10-CM

## 2015-01-04 NOTE — Telephone Encounter (Signed)
After TEE/cardioversion - Dr. Rennis GoldenHilty wanted patient to have BMET & digoxin level. Called patient and informed him that these lab tests will be ordered and he was instructed on when to go (late this week or early next week) and which lab to use. He voiced understanding.   Labs ordered.

## 2015-01-05 ENCOUNTER — Telehealth (HOSPITAL_COMMUNITY): Payer: Self-pay

## 2015-01-05 NOTE — Telephone Encounter (Signed)
Encounter complete. 

## 2015-01-06 ENCOUNTER — Encounter: Payer: Self-pay | Admitting: Internal Medicine

## 2015-01-06 NOTE — Assessment & Plan Note (Signed)
On eliquis now s/p conversion. He is still wearing monitor now and will follow up with cardiology.

## 2015-01-06 NOTE — Assessment & Plan Note (Signed)
Currently close to goal off medication. He is going to work with diet and exercise to bring his LDL to goal (<130).

## 2015-01-06 NOTE — Progress Notes (Signed)
   Subjective:    Patient ID: Jeremy King, male    DOB: [birth year], 64 y.o.   MRN: [id number]  HPI The patient is a 64 YO man who is coming in to follow up on his Atrial fibrillation. He had conversion in the hospital recently and is still wearing a monitor to see if it was successful. Unclear when his atrial fib started although he had heard something about an abnormal beat while at the [facility] some years ago. He did not have a regular doctor and wants one to be able to check him for other problems. Denies any complaints or health concerns that he is aware of. Denies chest pains, SOB, palpitations at this time. Denies any dizziness with standing. He wants to know whether the heart a fib could come back. He felt like the [facility] found this awhile ago and did not do anything about it.   PM[facility], [facility], social history reviewed and updated during today's visit.   Review of Systems  Constitutional: Negative for fever, activity change, appetite change, fatigue and unexpected weight change.  HENT: Negative.   Eyes: Negative.   Respiratory: Negative for cough, chest tightness, shortness of breath and wheezing.   Cardiovascular: Negative for chest pain, palpitations and leg swelling.  Gastrointestinal: Negative for abdominal pain, diarrhea, constipation and abdominal distention.  Musculoskeletal: Negative.   Skin: Negative.   Neurological: Negative.   Psychiatric/Behavioral: Negative.       Objective:   Physical Exam  Constitutional: He is oriented to person, place, and time. He appears well-developed and well-nourished.  HENT:  Head: Normocephalic and atraumatic.  Eyes: EOM are normal.  Neck: Normal range of motion.  Cardiovascular: Normal rate and regular rhythm.   Sounds regular  Pulmonary/Chest: Effort normal and breath sounds normal. No respiratory distress. He has no wheezes. He has no rales.  Abdominal: Soft. Bowel sounds are normal. He exhibits no distension. There is no tenderness.    Musculoskeletal: He exhibits no edema.  Neurological: He is alert and oriented to person, place, and time.  Skin: Skin is warm and dry.  Psychiatric: He has a normal mood and affect.   Filed Vitals:   01/03/15 1057  BP: 108/58  Pulse: 54  Temp: 98.4 F (36.9 C)  TempSrc: Oral  Resp: 12  Height: 6' (1.829 m)  Weight: 204 lb (92.534 kg)  SpO2: 96%      Assessment & Plan:

## 2015-01-06 NOTE — Assessment & Plan Note (Signed)
Patient admits to feeling okay on his effexor. He is also using trazodone for sleep. He states that this is mostly effective although he does not take it every night.

## 2015-01-09 ENCOUNTER — Telehealth: Payer: Self-pay | Admitting: *Deleted

## 2015-01-09 NOTE — Telephone Encounter (Signed)
Faxed PA for eliquis 5mg BID to [facility] 

## 2015-01-10 ENCOUNTER — Ambulatory Visit (HOSPITAL_COMMUNITY)
Admission: RE | Admit: 2015-01-10 | Discharge: 2015-01-10 | Disposition: A | Discharge status: Home / Self Care | Payer: Medicare Other | Source: Ambulatory Visit | Attending: Cardiology | Admitting: Cardiology

## 2015-01-10 DIAGNOSIS — I4891 Unspecified atrial fibrillation: Secondary | ICD-10-CM

## 2015-01-10 DIAGNOSIS — Z01810 Encounter for preprocedural cardiovascular examination: Secondary | ICD-10-CM

## 2015-01-10 MED ORDER — TECHNETIUM TC 99M SESTAMIBI GENERIC - CARDIOLITE
10.4000 | Freq: Once | INTRAVENOUS | Status: AC | PRN
  Administered 2015-01-10: 10 via INTRAVENOUS

## 2015-01-10 MED ORDER — TECHNETIUM TC 99M SESTAMIBI GENERIC - CARDIOLITE
31.6000 | Freq: Once | INTRAVENOUS | Status: AC | PRN
  Administered 2015-01-10: 32 via INTRAVENOUS

## 2015-01-10 NOTE — Procedures (Addendum)
[facility] [address] [place] [address] [phone]  Cardiology Nuclear Med Study  Jeremy BestKimon R [name] is a 64 y.o. male     MRN : [id number]     DOB: Sep 06, 1951  Procedure Date: 01/10/2015  Nuclear Med Background Indication for Stress Test:  Surgical Clearance History:  AFib, no prior MPI study for comparison Cardiac Risk Factors: RBBB  Symptoms:  Syncope   Nuclear Pre-Procedure Caffeine/Decaff Intake:  7:00pm NPO After: 4:00am   IV Site: R Antecubital  IV 0.9% NS with Angio Cath:  22g  Chest Size (in):  44 IV Started by: Koren Shiverobin Moffitt, CNMT  Height: 6' (1.829 m)  Cup Size: n/a  BMI:  Body mass index is 27.53 kg/(m^2). Weight:  203 lb (92.08 kg)   Tech Comments:  n/a    Nuclear Med Study 1 or 2 day study: 1 day  Stress Test Type:  Stress  Order Authorizing Provider:  Zoila ShutterKenneth Hilty, MD   Resting Radionuclide: Technetium 6523m Sestamibi  Resting Radionuclide Dose: 10.4 mCi   Stress Radionuclide:  Technetium 4723m Sestamibi  Stress Radionuclide Dose: 31.6 mCi           Stress Protocol Rest HR: 90 Stress HR: 146  Rest BP: 102/74 Stress BP: 148/66  Exercise Time (min): 7 METS: 8.5   Predicted Max HR: 155 bpm % Max HR: 94.19 bpm Rate Pressure Product: [id number]  Dose of Adenosine (mg):  n/a Dose of Lexiscan: n/a mg  Dose of Atropine (mg): n/a Dose of Dobutamine: n/a mcg/kg/min (at max HR)  Stress Test Technologist: Esperanza Sheetserry-Marie Martin, CCT Nuclear Technologist:Elizabeth Young,CNMT   Rest Procedure:  Myocardial perfusion imaging was performed at rest 45 minutes following the intravenous administration of Technetium 2623m Sestamibi. Stress Procedure:  The patient performed treadmill exercise using a Bruce  Protocol for 7 minutes. The patient stopped due to SOB and denied any chest pain.  There were no significant ST-T wave changes.  Technetium 3923m Sestamibi was injected IV at peak exercise and myocardial perfusion imaging was  performed after a brief delay.  Transient Ischemic Dilatation (Normal <1.22):  1.16  QGS EDV:  NA QGS ESV:  NA LV Ejection Fraction: Study not gated    Rest ECG: NSR-RBBB  Stress ECG: Insignificant upsloping ST segment depression.  QPS Raw Data Images:  Acquisition technically good; normal left ventricular size. Stress Images:  There is decreased uptake in the anterior and inferior walls. Rest Images:  There is decreased uptake in the anterior and inferior walls, inferior defect less prominent compared to the stress images. Subtraction (SDS):  These findings are consistent with small prior anterior and inferior infarcts; minimal ischemia in the inferior wall.  Impression Exercise Capacity:  Fair exercise capacity. BP Response:  Normal blood pressure response. Clinical Symptoms:  There is dyspnea. ECG Impression:  Insignificant upsloping ST segment depression. 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  LV Wall Motion:  Study not gated.   Olga MillersBrian Crenshaw, MD  01/10/2015 1:29 PM

## 2015-01-10 NOTE — Telephone Encounter (Signed)
eliquis 5mg  BID approved thru 01/08/2016 under medicare part D

## 2015-01-11 NOTE — Telephone Encounter (Signed)
Encounter complete. 

## 2015-01-12 ENCOUNTER — Telehealth: Payer: Self-pay | Admitting: Internal Medicine

## 2015-01-12 ENCOUNTER — Encounter: Payer: Self-pay | Admitting: Internal Medicine

## 2015-01-12 DIAGNOSIS — Z79899 Other long term (current) drug therapy: Secondary | ICD-10-CM | POA: Diagnosis not present

## 2015-01-12 NOTE — Telephone Encounter (Signed)
Spoke with [facility], lab orders confirmed.

## 2015-01-13 LAB — BASIC METABOLIC PANEL
BUN: 21 mg/dL (ref 6–23)
CALCIUM: 9.3 mg/dL (ref 8.4–10.5)
CO2: 23 meq/L (ref 19–32)
CREATININE: 1.06 mg/dL (ref 0.50–1.35)
Chloride: 106 mEq/L (ref 96–112)
Glucose, Bld: 93 mg/dL (ref 70–99)
Potassium: 4.8 mEq/L (ref 3.5–5.3)
Sodium: 141 mEq/L (ref 135–145)

## 2015-01-13 LAB — DIGOXIN LEVEL: Digoxin Level: 1.7 ng/mL (ref 0.8–2.0)

## 2015-01-16 ENCOUNTER — Ambulatory Visit (INDEPENDENT_AMBULATORY_CARE_PROVIDER_SITE_OTHER): Payer: Medicare Other | Admitting: Internal Medicine

## 2015-01-16 ENCOUNTER — Encounter: Payer: Self-pay | Admitting: Internal Medicine

## 2015-01-16 VITALS — BP 98/50 | HR 50 | Ht 72.0 in | Wt 201.1 lb

## 2015-01-16 DIAGNOSIS — R001 Bradycardia, unspecified: Secondary | ICD-10-CM

## 2015-01-16 DIAGNOSIS — I4891 Unspecified atrial fibrillation: Secondary | ICD-10-CM

## 2015-01-16 DIAGNOSIS — R55 Syncope and collapse: Secondary | ICD-10-CM

## 2015-01-16 DIAGNOSIS — I951 Orthostatic hypotension: Secondary | ICD-10-CM | POA: Diagnosis not present

## 2015-01-16 DIAGNOSIS — E785 Hyperlipidemia, unspecified: Secondary | ICD-10-CM

## 2015-01-16 MED ORDER — DIGOXIN 125 MCG PO TABS: 0.1250 mg | ORAL_TABLET | Freq: Every day | ORAL | Status: DC

## 2015-01-16 NOTE — Patient Instructions (Addendum)
Your physician has recommended you make the following change in your medication: DECREASE digoxin to 0.125mg  daily   Dr. Rennis GoldenHilty has referred you to Dr. Johney FrameAllred - electrophysiologist - address is [address] (near [place])  Your physician recommends that you schedule a follow-up appointment in 3 months

## 2015-01-16 NOTE — Progress Notes (Signed)
OFFICE NOTE  Chief Complaint:  Follow-up cardioversion  Primary Care Physician: Judie Bonus, MD  HPI:  Jeremy King is a pleasant 64 year old male with few medical problems. He has a history of anxiety as well as dyslipidemia. Unfortunately he's had a number of syncopal episodes and has known orthostatic hypotension. He tells me that couple years ago he was seen at the [facility] and syncopized. Blood pressure was noted to be 60 over palpation. He was hospitalized for this and treated but is had recurrent hypotension. He was previously on Flomax which was discontinued. He does have a history of kidney stones with what sounds like ureteral stenting and/or lithotripsy. He probably saw a cardiologist once at the [place] and he tells me that they did not recommend any different medications. His CHADSVASC score is 1 given his age of 69 and hypertension. He has been maintained on aspirin. Recently he was seen in [facility] after a syncopal episode. He underwent cardiac workup including echocardiogram which shows normal LV function. He was noted to be in A. fib at that time. He says that it comes and goes but is not clear whether it has become more persistent. He's never had a cardioversion before. He denies significant alcohol use but has been a long-term smoker. There also is a strong family history of coronary disease in both parents. Recently he's been having problems with his knee and is contemplating left knee surgery with Dr. Ciro Backer.  It should be noted in the office today that he became dizzy after sitting up for his EKG. Orthostatic blood pressures were taken and he had a 20 point drop in systolic blood pressure with change in position.  I saw Jeremy King back in the office today. Unfortunately his home monitor has been alarming as he's had frequent atrial fibrillation with rapid ventricular response. Heart rates have been up into the 180s. He also had a near-syncopal episode. He  unfortunately did not check his blood pressure at that time. He's been pretty much in persistent A. fib since we placed the monitor. I think this is at least contributing to his symptoms. 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 Exam is not consistent with heart failure. Blood pressure is slightly improved today at 100 systolic, however it is limited any AV nodal blocking medications. He's currently only on aspirin because of a low CHADSVASC score.  Jeremy King returns today for follow-up. He successfully cardioverted and is maintaining sinus bradycardia. He's worn a Holter monitor which demonstrates several episodes of significant bradycardia into the 30s. He's had some sinus pauses of 2-1/2-3 seconds, which appear mostly at night. Laboratory work is unremarkable and digoxin level is 1.7. The digoxin, again was started for heart rate control prior to cardioversion as his blood pressure was less than 90 systolic with significant orthostasis. I also discontinued his trazodone which seems to have significantly improved his orthostasis. Blood pressure remains low today at 98/50. He underwent a nuclear stress test which showed 2 small fixed defects which were thought to be either small areas of scar or artifact. His echo showed normal wall motion and preserved systolic function. He is not describing anginal symptoms.  PMHx:  Past Medical History  Diagnosis Date  . Kidney stones   . Atrial fibrillation   . Back ache   . Dysrhythmia     Past Surgical History  Procedure Laterality Date  . Lithotripsy    . Cardioversion N/A 12/30/2014  Procedure: CARDIOVERSION;  Surgeon: Chrystie Nose, MD;  Location: [facility] ENDOSCOPY;  Service: Cardiovascular;  Laterality: N/A;  . Tee without cardioversion N/A 12/30/2014    Procedure: TRANSESOPHAGEAL ECHOCARDIOGRAM (TEE);  Surgeon: Chrystie Nose, MD;  Location: [facility] ENDOSCOPY;  Service: Cardiovascular;  Laterality: N/A;    FAMHx:  Family History  Problem  Relation Age of Onset  . Heart failure Mother     mitral valve disease  . Diabetes Father   . Heart failure Father     MI  . Hypertension Father   . CAD Father   . Alcoholism Father   . Cancer Sister     SOCHx:   reports that he has been smoking Cigarettes.  He has been smoking about 1.00 pack per day. He has quit using smokeless tobacco. He reports that he does not drink alcohol or use illicit drugs.  ALLERGIES:  Allergies  Allergen Reactions  . Sertraline Hcl     Extreme headaches    ROS: A comprehensive review of systems was negative except for: Constitutional: positive for Syncope Cardiovascular: positive for irregular heart beat Musculoskeletal: positive for arthralgias  HOME MEDS: Current Outpatient Prescriptions  Medication Sig Dispense Refill  . apixaban (ELIQUIS) 5 MG TABS tablet Take 1 tablet (5 mg total) by mouth 2 (two) times daily. 60 tablet 6  . Ascorbic Acid (VITAMIN C) 1000 MG tablet Take 1,000 mg by mouth daily.    . clonazePAM (KLONOPIN) 1 MG tablet Take 1 mg by mouth 2 (two) times daily.    . fluticasone (FLONASE) 50 MCG/ACT nasal spray Place 2 sprays into both nostrils daily.     . Garlic 1000 MG CAPS Take 1 capsule by mouth daily.    [name] ibuprofen (ADVIL,MOTRIN) 200 MG tablet Take 800 mg by mouth every 6 (six) hours as needed for moderate pain (pain).    [name] loratadine (CLARITIN) 10 MG tablet Take 10 mg by mouth daily.    . Melatonin 3 MG TABS Take 1 tablet by mouth at bedtime.    . multivitamin (ONE-A-DAY MEN'S) TABS tablet Take 1 tablet by mouth daily.    . Omega-3 Fatty Acids (FISH OIL) 1000 MG CAPS Take 1 capsule by mouth daily.    [name] venlafaxine XR (EFFEXOR-XR) 150 MG 24 hr capsule Take 150 mg by mouth daily with breakfast.    . digoxin (LANOXIN) 0.125 MG tablet Take 1 tablet (0.125 mg total) by mouth daily. 90 tablet 3   No current facility-administered medications for this visit.    LABS/IMAGING: No results found for this or any previous visit  (from the past 48 hour(s)). No results found.  VITALS: BP 98/50 mmHg  Pulse 50  Ht 6' (1.829 m)  Wt 201 lb 1.6 oz (91.218 kg)  BMI 27.27 kg/m2  EXAM: Deferred  EKG: Marked sinus bradycardia 50  ASSESSMENT: 1. Atrial fibrillation with RVR-near-syncope 2. Normal LV function by recent echocardiogram 3. Recurrent syncopal episodes 4. Significant orthostatic hypotension 5. Bradycardia with pauses  PLAN: 1.   Jeremy King had A. fib with RVR which converted during a TEE cardioversion. He's been maintained on digoxin which is the only rate control medication I feel that we could use given his low blood pressure. Another option may be antiarrhythmic therapy. Continue monitoring after cardioversion did demonstrate periods of bradycardia into the 30s and 40s with pauses up to 3 seconds.  Some of these occur at night. He lives alone and although reports poor sleep and trouble falling asleep, he says  that he is not aware that he snores or gasp for breath as in sleep apnea. I offered a sleep study, but he declined. I'm concerned about his ongoing bradycardia, but feel that he needs some type of AV nodal blocker agent. Systolic blood pressure remains below 100. I recommend decreasing his digoxin to 0.125 mg daily.  I will refer him to electrophysiology for further evaluation, question of whether he needs antiarrhythmic therapy or perhaps a pacemaker. He should remain on Eliquis for anticoagulation.  Chrystie NoseKenneth C. Paulett Kaufhold, MD, [facility] Attending Cardiologist [facility]  Akeiba Axelson C 01/16/2015, 1:30 PM

## 2015-01-18 ENCOUNTER — Ambulatory Visit: Payer: Medicare Other | Admitting: Internal Medicine

## 2015-02-22 ENCOUNTER — Ambulatory Visit (INDEPENDENT_AMBULATORY_CARE_PROVIDER_SITE_OTHER): Payer: Medicare Other | Admitting: Internal Medicine

## 2015-02-22 ENCOUNTER — Encounter: Payer: Self-pay | Admitting: Internal Medicine

## 2015-02-22 VITALS — BP 108/68 | HR 75 | Ht 72.0 in | Wt 204.2 lb

## 2015-02-22 DIAGNOSIS — I951 Orthostatic hypotension: Secondary | ICD-10-CM | POA: Diagnosis not present

## 2015-02-22 DIAGNOSIS — I480 Paroxysmal atrial fibrillation: Secondary | ICD-10-CM | POA: Diagnosis not present

## 2015-02-22 DIAGNOSIS — R55 Syncope and collapse: Secondary | ICD-10-CM

## 2015-02-22 MED ORDER — DRONEDARONE HCL 400 MG PO TABS: 400.0000 mg | ORAL_TABLET | Freq: Two times a day (BID) | ORAL | Status: DC

## 2015-02-22 NOTE — Patient Instructions (Addendum)
Medication Instructions: - Stop digoxin (lanoxin)  - Start Multaq 400 mg one tablet by mouth twice daily  Labwork: - none  Procedures/Testing: - none-  Follow-Up: - Your physician recommends that you schedule a follow-up appointment in: 6 weeks with Dr. Johney FrameAllred.- Monday 04/03/15 at 10:15 am   Any Additional Special Instructions Will Be Listed Below (If Applicable).

## 2015-02-22 NOTE — Progress Notes (Signed)
Electrophysiology Office Note   Date:  02/22/2015   ID:  Jeremy King, DOB 1950/10/14, MRN [id number]  PCP:  Jeremy BonusKollar, Elizabeth A, MD  Cardiologist:  Dr Jeremy King Primary Electrophysiologist: Jeremy RangeJames Hobart Marte, MD    Chief Complaint  Patient presents with  . Atrial Fibrillation     History of Present Illness: Jeremy King is King 64 y.o. male who presents today for electrophysiology evaluation.   The patient presents for further evaluation of atrial fibrillation and difficult to control ventricular rates.  He reports initially being diagnosed with atrial fibrillation 3 years ago.  [facility] and has presyncope/ dizziness.  On ekg at that time, he was found to have afib with RVR.  He was hypotensive at that time.  He has had difficulty with RVR since.  Rate control has been limited by rate control.  His presently only on digoxin for rate control.  He has had increasing frequency and duration of atrial fibrillation since that time.  He is very aware of his afib and feels that this causes his low bp, dizziness, and syncope.  He has significant postural dizziness during afib.  He has had some nocturnal pauses for which he has been asymptomatic.  He has L knee pain and is considering surgery on this.  Today, he denies symptoms of chest pain, shortness of breath, orthopnea, PND, lower extremity edema, claudication,  bleeding, or neurologic sequela. The patient is tolerating medications without difficulties and is otherwise without complaint today.    Past Medical History  Diagnosis Date  . Kidney stones   . Paroxysmal atrial fibrillation     chads2vasc score is at least 1  . Back ache   . Hypotension   . Syncope     due to hypotensin/ complicated by RVR with afib   Past Surgical History  Procedure Laterality Date  . Lithotripsy    . Cardioversion N/King 12/30/2014    Procedure: CARDIOVERSION;  Surgeon: Chrystie NoseKenneth C Hilty, MD;  [facility];  Service:  Cardiovascular;  Laterality: N/King;  . Tee without cardioversion N/King 12/30/2014    Procedure: TRANSESOPHAGEAL ECHOCARDIOGRAM (TEE);  Surgeon: Chrystie NoseKenneth C Hilty, MD;  [facility];  Service: Cardiovascular;  Laterality: N/King;     Current Outpatient Prescriptions  Medication Sig Dispense Refill  . apixaban (ELIQUIS) 5 MG TABS tablet Take 1 tablet (5 mg total) by mouth 2 (two) times daily. 60 tablet 6  . Ascorbic Acid (VITAMIN C) 1000 MG tablet Take 1,000 mg by mouth daily.    . clonazePAM (KLONOPIN) 1 MG tablet Take 1 mg by mouth 2 (two) times daily.    . digoxin (LANOXIN) 0.125 MG tablet Take 1 tablet (0.125 mg total) by mouth daily. 90 tablet 3  . Garlic 1000 MG CAPS Take 1 capsule by mouth daily.    [name]. ibuprofen (ADVIL,MOTRIN) 200 MG tablet Take 800 mg by mouth every 6 (six) hours as needed for moderate pain (pain).    [name]. loratadine (CLARITIN) 10 MG tablet Take 10 mg by mouth daily.    . Melatonin 3 MG TABS Take 3 tablets by mouth at bedtime.     . multivitamin (ONE-King-DAY MEN'S) TABS tablet Take 1 tablet by mouth daily.    . Omega-3 Fatty Acids (FISH OIL) 1000 MG CAPS Take 1 capsule by mouth 2 (two) times daily.     [name]. venlafaxine XR (EFFEXOR-XR) 150 MG 24 hr capsule Take 150 mg by mouth daily with breakfast.    .  fluticasone (FLONASE) 50 MCG/ACT nasal spray Place 2 sprays into both nostrils daily.      No current facility-administered medications for this visit.    Allergies:   Sertraline hcl   Social History:  The patient  reports that he has been smoking Cigarettes.  He has been smoking about 1.00 pack per day. He has quit using smokeless tobacco. He reports that he does not drink alcohol or use illicit drugs.   Family History:  The patient's  family history includes Alcoholism in his father; CAD in his father; Cancer in his sister; Diabetes in his father; Heart failure in his father and mother; Hypertension in his father.    ROS:  Please see the history of present illness.   All  other systems are reviewed and negative.    PHYSICAL EXAM: VS:  BP 108/68 mmHg  Pulse 75  Ht 6' (1.829 m)  Wt 204 lb 3.2 oz (92.625 kg)  BMI 27.69 kg/m2 , BMI Body mass index is 27.69 kg/(m^2). GEN: Well nourished, well developed, in no acute distress HEENT: normal Neck: no JVD, carotid bruits, or masses Cardiac: RRR; no murmurs, rubs, or gallops,no edema  Respiratory:  clear to auscultation bilaterally, normal work of breathing GI: soft, nontender, nondistended, + BS MS: no deformity or atrophy Skin: warm and dry  Neuro:  Strength and sensation are intact Psych: euthymic mood, full affect  EKG:  EKG is ordered today. The ekg ordered today shows sinus rhythm with PACs, iRBBB, Qtc 413   Recent Labs: 11/27/2014: ALT 13; Magnesium 2.1 12/29/2014: Hemoglobin 16.1; Platelets 271; TSH 1.316 01/12/2015: BUN 21; Creatinine 1.06; Potassium 4.8; Sodium 141    Lipid Panel     Component Value Date/Time   CHOL 199 11/27/2014 0615   TRIG 82 11/27/2014 0615   HDL 34* 11/27/2014 0615   CHOLHDL 5.9 11/27/2014 0615   VLDL 16 11/27/2014 0615   LDLCALC 149* 11/27/2014 0615   LDLDIRECT 248.0 04/03/2007 1007     Wt Readings from Last 3 Encounters:  02/22/15 204 lb 3.2 oz (92.625 kg)  01/16/15 201 lb 1.6 oz (91.218 kg)  01/10/15 203 lb (92.08 kg)      Other studies Reviewed: Additional studies/ records that were reviewed today include: Dr Andrez GrimeHiltys notes, recent echo  Review of the above records today demonstrates: preserved EF, no significant valvular disease, LA size 42mm   ASSESSMENT AND PLAN:  1.  Paroxysmal atrial fibrillation He has recurrent symptomatic afib.  Rate control has been limited by low blood pressure.  He has not tried AAD therapy. I would favor initiation of AAD at this time.  I discussed multaq, tikosyn, sotalol, and amiodarone as options.  At this time, I would favor trying multaq.  I will stop digoxin due to concerns of dig toxicity reported with the combination of  multaq and dig.  He will continue eliquis.   Return in 6 weeks.  If he continues to have afib, I would have King low threshold to proceed with ablation.  2. Nocturnal bradycardia Will continue to follow I think that we should try to avoid pacing.  3. Hypotension/ syncope I believe this is due to afib with RVR Hopefully he will do well with maintenance of sinus rhythm   Follow-up: return to see me in 6 weeks   Signed, Jeremy RangeJames Dayelin Balducci, MD  02/22/2015 10:44 AM     [facility] [address] [place] [address] [phone] (office) [phone] (fax)

## 2015-03-01 ENCOUNTER — Telehealth: Payer: Self-pay | Admitting: Internal Medicine

## 2015-03-01 NOTE — Telephone Encounter (Signed)
Patient requesting refill for clonazePAM (KLONOPIN) 1 MG tablet [[id number]. Pharmacy is [facility] on [address]

## 2015-03-02 MED ORDER — CLONAZEPAM 1 MG PO TABS: 1.0000 mg | ORAL_TABLET | Freq: Two times a day (BID) | ORAL | Status: DC | PRN

## 2015-03-02 NOTE — Telephone Encounter (Signed)
Notified pt md ok # [phone] until appt. Faxed script to [facility]...Raechel Chute/lmb

## 2015-03-02 NOTE — Telephone Encounter (Signed)
Ok, printed and signed, please fax.

## 2015-03-02 NOTE — Addendum Note (Signed)
Addended by: Genella MechKOLLAR, Anaissa Macfadden A on: 03/02/2015 10:04 AM   Modules accepted: Orders

## 2015-03-02 NOTE — Telephone Encounter (Signed)
Would need to see and discuss as I have never written this for him.

## 2015-03-02 NOTE — Telephone Encounter (Signed)
Notified pt with md response. [facility]. He has made appt for 03/17/15 wanting to see if md can rx enough until he come to his appt....Raechel Chute/lmb

## 2015-03-16 ENCOUNTER — Telehealth: Payer: Self-pay | Admitting: *Deleted

## 2015-03-16 NOTE — Telephone Encounter (Signed)
Faxed notification to [facility] - Attn: Imelda Pillow - Dr. Darrelyn Hillock - that patient will need to postpone surgery until AF is controlled.

## 2015-03-17 ENCOUNTER — Ambulatory Visit (INDEPENDENT_AMBULATORY_CARE_PROVIDER_SITE_OTHER): Payer: Medicare Other | Admitting: Internal Medicine

## 2015-03-17 ENCOUNTER — Encounter: Payer: Self-pay | Admitting: Internal Medicine

## 2015-03-17 VITALS — BP 98/58 | HR 61 | Temp 97.9°F | Resp 14 | Ht 72.0 in | Wt 208.4 lb

## 2015-03-17 DIAGNOSIS — Z23 Encounter for immunization: Secondary | ICD-10-CM | POA: Diagnosis not present

## 2015-03-17 DIAGNOSIS — F411 Generalized anxiety disorder: Secondary | ICD-10-CM

## 2015-03-17 MED ORDER — CLONAZEPAM 1 MG PO TABS: 1.0000 mg | ORAL_TABLET | Freq: Two times a day (BID) | ORAL | Status: DC | PRN

## 2015-03-17 MED ORDER — VENLAFAXINE HCL ER 150 MG PO CP24: 150.0000 mg | ORAL_CAPSULE | Freq: Every day | ORAL | Status: DC

## 2015-03-17 NOTE — Assessment & Plan Note (Signed)
Okay with taking over his effexor and clonazepam. His mood is well controlled and anxiety low overall. Have asked him to use clonazepam only when needed. He does exercise to help with his stress.

## 2015-03-17 NOTE — Progress Notes (Signed)
   Subjective:    Patient ID: Jeremy King, male    DOB: [birth year], 64 y.o.   MRN: [id number]  HPI The patient is a 64 YO vet coming in for his anxiety today. He has been well controlled on venlafaxin for years and is now unable to get it from the [place]. He also has clonazepam that he uses for sleep and if he needs it for anxiety during the day. He denies any current SI/HI and none in the past. Doing well with his anxiety. He tries to walk or exercise almost daily for any stress. His heart is in normal rhythm since changes to his medicines from cardiology which has helped with his episodes of dizziness and syncope.   Review of Systems  Constitutional: Negative for fever, activity change, appetite change, fatigue and unexpected weight change.  HENT: Negative.   Respiratory: Negative for cough, chest tightness, shortness of breath and wheezing.   Cardiovascular: Negative for chest pain, palpitations and leg swelling.  Gastrointestinal: Negative for abdominal pain, diarrhea, constipation and abdominal distention.  Neurological: Negative.   Psychiatric/Behavioral: Negative.       Objective:   Physical Exam  Constitutional: He is oriented to person, place, and time. He appears well-developed and well-nourished.  HENT:  Head: Normocephalic and atraumatic.  Eyes: EOM are normal.  Neck: Normal range of motion.  Cardiovascular: Normal rate and regular rhythm.   Sounds regular, pulse regular and steady  Pulmonary/Chest: Effort normal and breath sounds normal. No respiratory distress. He has no wheezes. He has no rales.  Abdominal: Soft. Bowel sounds are normal. He exhibits no distension. There is no tenderness.  Musculoskeletal: He exhibits no edema.  Neurological: He is alert and oriented to person, place, and time.  Skin: Skin is warm and dry.  Psychiatric: He has a normal mood and affect.   Filed Vitals:   03/17/15 0845  BP: 98/58  Pulse: 61  Temp: 97.9 F (36.6 C)  TempSrc: Oral    Resp: 14  Height: 6' (1.829 m)  Weight: 208 lb 6.4 oz (94.53 kg)  SpO2: 90%      Assessment & Plan:  Tdap given at visit.

## 2015-03-17 NOTE — Patient Instructions (Signed)
We have sent in the refills of the clonazepam and the venlafaxine.   I'm glad that the multaq seems to be working and your heart is in regular sinus today.   Come back in about 6 months for a physical or sooner if you have any problems or questions.

## 2015-03-17 NOTE — Progress Notes (Signed)
Pre visit review using our clinic review tool, if applicable. No additional management support is needed unless otherwise documented below in the visit note. 

## 2015-04-03 ENCOUNTER — Encounter: Payer: Self-pay | Admitting: Internal Medicine

## 2015-04-03 ENCOUNTER — Ambulatory Visit (INDEPENDENT_AMBULATORY_CARE_PROVIDER_SITE_OTHER): Payer: Medicare Other | Admitting: Internal Medicine

## 2015-04-03 VITALS — BP 118/80 | HR 56 | Ht 72.0 in | Wt 212.0 lb

## 2015-04-03 DIAGNOSIS — R55 Syncope and collapse: Secondary | ICD-10-CM | POA: Diagnosis not present

## 2015-04-03 DIAGNOSIS — I481 Persistent atrial fibrillation: Secondary | ICD-10-CM | POA: Diagnosis not present

## 2015-04-03 DIAGNOSIS — I4819 Other persistent atrial fibrillation: Secondary | ICD-10-CM

## 2015-04-03 NOTE — Patient Instructions (Signed)
Medication Instructions:  Your physician recommends that you continue on your current medications as directed. Please refer to the Current Medication list given to you today.   Labwork: None ordered   Testing/Procedures: None ordered   Follow-Up: Your physician recommends that you schedule a follow-up appointment as needed    Any Other Special Instructions Will Be Listed Below (If Applicable).   

## 2015-04-03 NOTE — Progress Notes (Signed)
PCP: Judie BonusKollar, Elizabeth A, MD Primary Cardiologist:  Dr Michelle PiperHilty  Jeremy King is a 64 y.o. male who presents today for routine electrophysiology followup.  Since last being seen in our clinic, the patient reports doing very well.  He is very pleased with his results with multaq.  He has had no afib.  No presyncope or syncope. Today, he denies symptoms of palpitations, chest pain, shortness of breath,  lower extremity edema, or dizziness..  The patient is otherwise without complaint today.   Past Medical History  Diagnosis Date  . Kidney stones   . Paroxysmal atrial fibrillation     chads2vasc score is at least 1  . Back ache   . Hypotension   . Syncope     due to hypotensin/ complicated by RVR with afib   Past Surgical History  Procedure Laterality Date  . Lithotripsy    . Cardioversion N/A 12/30/2014    Procedure: CARDIOVERSION;  Surgeon: Chrystie NoseKenneth C Hilty, MD;  [facility];  Service: Cardiovascular;  Laterality: N/A;  . Tee without cardioversion N/A 12/30/2014    Procedure: TRANSESOPHAGEAL ECHOCARDIOGRAM (TEE);  Surgeon: Chrystie NoseKenneth C Hilty, MD;  [facility];  Service: Cardiovascular;  Laterality: N/A;    ROS- all systems are reviewed and negatives except as per HPI above  Current Outpatient Prescriptions  Medication Sig Dispense Refill  . apixaban (ELIQUIS) 5 MG TABS tablet Take 1 tablet (5 mg total) by mouth 2 (two) times daily. 60 tablet 6  . Ascorbic Acid (VITAMIN C) 1000 MG tablet Take 1,000 mg by mouth daily.    . clonazePAM (KLONOPIN) 1 MG tablet Take 1 tablet (1 mg total) by mouth 2 (two) times daily as needed for anxiety. 60 tablet 3  . dronedarone (MULTAQ) 400 MG tablet Take 1 tablet (400 mg total) by mouth 2 (two) times daily with a meal. 60 tablet 3  . Garlic 1000 MG CAPS Take 1 capsule by mouth daily.    [name]. ibuprofen (ADVIL,MOTRIN) 200 MG tablet Take 800 mg by mouth every 6 (six) hours as needed for moderate pain (pain).    [name]. loratadine (CLARITIN) 10 MG  tablet Take 10 mg by mouth daily.    . Melatonin 3 MG TABS Take 3 tablets by mouth at bedtime.     . multivitamin (ONE-A-DAY MEN'S) TABS tablet Take 1 tablet by mouth daily.    . Omega-3 Fatty Acids (FISH OIL) 1000 MG CAPS Take 1 capsule by mouth 2 (two) times daily.     [name]. venlafaxine XR (EFFEXOR-XR) 150 MG 24 hr capsule Take 1 capsule (150 mg total) by mouth daily with breakfast. 30 capsule 6   No current facility-administered medications for this visit.    Physical Exam: Filed Vitals:   04/03/15 1038  BP: 118/80  Pulse: 56  Height: 6' (1.829 m)  Weight: 96.163 kg (212 lb)    GEN- The patient is well appearing, alert and oriented x 3 today.   Head- normocephalic, atraumatic Eyes-  Sclera clear, conjunctiva pink Ears- hearing intact Oropharynx- clear Lungs- Clear to ausculation bilaterally, normal work of breathing Heart- Regular rate and rhythm, no murmurs, rubs or gallops, PMI not laterally displaced GI- soft, NT, ND, + BS Extremities- no clubbing, cyanosis, or edema  ekg today reveals sinus rhythm, incomplete RBBB  Assessment and Plan:  1. afib Well controlled with multaq No changes at this time  2. Syncope No further episodes Adequate hydration encouraged  Follow-up with Dr Rennis GoldenHilty as scheduled I will see  as needed going forward

## 2015-04-18 ENCOUNTER — Ambulatory Visit (INDEPENDENT_AMBULATORY_CARE_PROVIDER_SITE_OTHER): Payer: Medicare Other | Admitting: Internal Medicine

## 2015-04-18 ENCOUNTER — Encounter: Payer: Self-pay | Admitting: Internal Medicine

## 2015-04-18 VITALS — BP 122/70 | HR 63 | Ht 72.0 in | Wt 211.8 lb

## 2015-04-18 DIAGNOSIS — R5383 Other fatigue: Secondary | ICD-10-CM

## 2015-04-18 DIAGNOSIS — I951 Orthostatic hypotension: Secondary | ICD-10-CM

## 2015-04-18 DIAGNOSIS — I4819 Other persistent atrial fibrillation: Secondary | ICD-10-CM

## 2015-04-18 DIAGNOSIS — I480 Paroxysmal atrial fibrillation: Secondary | ICD-10-CM | POA: Diagnosis not present

## 2015-04-18 DIAGNOSIS — I481 Persistent atrial fibrillation: Secondary | ICD-10-CM

## 2015-04-18 DIAGNOSIS — G4719 Other hypersomnia: Secondary | ICD-10-CM | POA: Diagnosis not present

## 2015-04-18 DIAGNOSIS — R55 Syncope and collapse: Secondary | ICD-10-CM

## 2015-04-18 MED ORDER — ZOLPIDEM TARTRATE 5 MG PO TABS: 5.0000 mg | ORAL_TABLET | Freq: Every evening | ORAL | Status: DC | PRN

## 2015-04-18 NOTE — Progress Notes (Signed)
OFFICE NOTE  Chief Complaint:  Poor sleep, fatigue  Primary Care Physician: Judie BonusKollar, Elizabeth A, MD  HPI:  Jeremy King is a pleasant 64 year old male with few medical problems. He has a history of anxiety as well as dyslipidemia. Unfortunately he's had a number of syncopal episodes and has known orthostatic hypotension. He tells me that couple years ago he was [facility] and syncopized. Blood pressure was noted to be 60 over palpation. He was hospitalized for this and treated but is had recurrent hypotension. He was previously on Flomax which was discontinued. He does have a history of kidney stones with what sounds like ureteral stenting and/or lithotripsy. He [facility] a [facility] and he tells me that they did not recommend any different medications. His CHADSVASC score is 1 given his age of 64 and hypertension. He has been maintained on aspirin. Recently he was seen in [facility] after a syncopal episode. He underwent cardiac workup including echocardiogram which shows normal LV function. He was noted to be in A. fib at that time. He says that it comes and goes but is not clear whether it has become more persistent. He's never had a cardioversion before. He denies significant alcohol use but has been a long-term smoker. There also is a strong family history of coronary disease in both parents. Recently he's been having problems with his knee and is contemplating left knee surgery with Dr. Ciro BackerGiofree.  It should be noted in the office today that he became dizzy after sitting up for his EKG. Orthostatic blood pressures were taken and he had a 20 point drop in systolic blood pressure with change in position.  I saw Jeremy King back in the office today. Unfortunately his home monitor has been alarming as he's had frequent atrial fibrillation with rapid ventricular response. Heart rates have been up into the 180s. He also had a near-syncopal episode. He  unfortunately did not check his blood pressure at that time. He's been pretty much in persistent A. fib since we placed the monitor. I think this is at least contributing to his symptoms. 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 Exam is not consistent with heart failure. Blood pressure is slightly improved today at 100 systolic, however it is limited any AV nodal blocking medications. He's currently only on aspirin because of a low CHADSVASC score.  Jeremy King returns today for follow-up. He successfully cardioverted and is maintaining sinus bradycardia. He's worn a Holter monitor which demonstrates several episodes of significant bradycardia into the 30s. He's had some sinus pauses of 2-1/2-3 seconds, which appear mostly at night. Laboratory work is unremarkable and digoxin level is 1.7. The digoxin, again was started for heart rate control prior to cardioversion as his blood pressure was less than 90 systolic with significant orthostasis. I also discontinued his trazodone which seems to have significantly improved his orthostasis. Blood pressure remains low today at 98/50. He underwent a nuclear stress test which showed 2 small fixed defects which were thought to be either small areas of scar or artifact. His echo showed normal wall motion and preserved systolic function. He is not describing anginal symptoms.  Jeremy King returns today for follow-up. He has since seen Dr. Johney FrameAllred who put him on multiecho for control of his atrial fibrillation. Overall he feels that he may be doing well with this although yesterday he thought his heart rate was irregular. From what he described it sounds  like it was regularly irregular, this may not be atrial fibrillation. He seems to be tolerating this and the Eliquis without any bleeding problems. He is come off of trazodone due to problems with orthostatic hypotension and syncope. He's not had any further episodes. He is having problems sleeping at night  though. He was on melatonin which was initially helpful but however since it has not been helpful. In the past we talked about the possibility of obstructive sleep apnea or other sleep related disorder. He was hesitant to get a sleep study but now seems to be interested in that.  PMHx:  Past Medical History  Diagnosis Date  . Kidney stones   . Paroxysmal atrial fibrillation     chads2vasc score is at least 1  . Back ache   . Hypotension   . Syncope     due to hypotensin/ complicated by RVR with afib    Past Surgical History  Procedure Laterality Date  . Lithotripsy    . Cardioversion N/A 12/30/2014    Procedure: CARDIOVERSION;  Surgeon: Chrystie Nose, MD;  Location[facility] ENDOSCOPY;  Service: Cardiovascular;  Laterality: N/A;  . Tee without cardioversion N/A 12/30/2014    Procedure: TRANSESOPHAGEAL ECHOCARDIOGRAM (TEE);  Surgeon: Chrystie Nose, MD;  Location: [facility] ENDOSCOPY;  Service: Cardiovascular;  Laterality: N/A;    FAMHx:  Family History  Problem Relation Age of Onset  . Heart failure Mother     mitral valve disease  . Diabetes Father   . Heart failure Father     MI  . Hypertension Father   . CAD Father   . Alcoholism Father   . Cancer Sister     SOCHx:   reports that he has been smoking Cigarettes.  He has been smoking about 1.00 pack per day. He has quit using smokeless tobacco. He reports that he does not drink alcohol or use illicit drugs.  ALLERGIES:  Allergies  Allergen Reactions  . Sertraline Hcl     Extreme headaches  . Trazodone And Nefazodone Other (See Comments)    Irregular heart beat? Hypotension?    ROS: A comprehensive review of systems was negative except for: Constitutional: positive for Syncope Cardiovascular: positive for irregular heart beat Musculoskeletal: positive for arthralgias  HOME MEDS: Current Outpatient Prescriptions  Medication Sig Dispense Refill  . apixaban (ELIQUIS) 5 MG TABS tablet Take 1 tablet (5 mg total) by mouth 2  (two) times daily. 60 tablet 6  . Ascorbic Acid (VITAMIN C) 1000 MG tablet Take 1,000 mg by mouth daily.    . clonazePAM (KLONOPIN) 1 MG tablet Take 1 tablet (1 mg total) by mouth 2 (two) times daily as needed for anxiety. 60 tablet 3  . dronedarone (MULTAQ) 400 MG tablet Take 1 tablet (400 mg total) by mouth 2 (two) times daily with a meal. 60 tablet 3  . Garlic 1000 MG CAPS Take 1 capsule by mouth daily.    [name] ibuprofen (ADVIL,MOTRIN) 200 MG tablet Take 800 mg by mouth every 6 (six) hours as needed for moderate pain (pain).    . Melatonin 3 MG TABS Take 3 tablets by mouth at bedtime.     . multivitamin (ONE-A-DAY MEN'S) TABS tablet Take 1 tablet by mouth daily.    . Omega-3 Fatty Acids (FISH OIL) 1000 MG CAPS Take 1 capsule by mouth 2 (two) times daily.     [name] venlafaxine XR (EFFEXOR-XR) 150 MG 24 hr capsule Take 1 capsule (150 mg total) by mouth daily with  breakfast. 30 capsule 6  . zolpidem (AMBIEN) 5 MG tablet Take 1 tablet (5 mg total) by mouth at bedtime as needed for sleep. 20 tablet 0   No current facility-administered medications for this visit.    LABS/IMAGING: No results found for this or any previous visit (from the past 48 hour(s)). No results found.  VITALS: BP 122/70 mmHg  Pulse 63  Ht 6' (1.829 m)  Wt 211 lb 12.8 oz (96.072 kg)  BMI 28.72 kg/m2  EXAM: General appearance: alert, fatigued and no distress Neck: no carotid bruit, no JVD and thyroid not enlarged, symmetric, no tenderness/mass/nodules Lungs: clear to auscultation bilaterally Heart: regular rate and rhythm, S1, S2 normal, no murmur, click, rub or gallop Abdomen: soft, non-tender; bowel sounds normal; no masses,  no organomegaly Extremities: extremities normal, atraumatic, no cyanosis or edema Pulses: 2+ and symmetric Skin: Skin color, texture, turgor normal. No rashes or lesions Psych: Anxious  EKG: Sinus rhythm at 63   ASSESSMENT: 1. Atrial fibrillation - maintaining sinus on Multaq after  cardioversion (CHADSVASC 2) 2. Normal LV function by recent echocardiogram 3. Recurrent syncopal episodes 4. Significant orthostatic hypotension - related to a-fib and/or trazadone 5. Bradycardia with pauses 6. Fatigue, non-restorative sleep  PLAN: 1.   Mr. Towell seems to be maintaining sinus for the most part on multi. He has had persistent fatigue and difficulty falling asleep and staying asleep at night. There may be underlying sleep disorder. I am recommending a sleep study and he is interested in that. We will go ahead and prescribe a short course of Ambien for him to take as needed for insomnia as well as to assist in the sleep study here in for now I recommend he continue on Multaq. He is to jaw that has been discontinued. He is on Eliquis for anticoagulation.  Plan to see him back in a couple of months.  Chrystie Nose, MD, [facility] Attending Cardiologist [facility]  Lisette Abu [facility] 04/18/2015, 11:05 AM

## 2015-04-18 NOTE — Patient Instructions (Signed)
Your physician has recommended that you have a sleep study @ [facility]. This test records several body functions during sleep, including: brain activity, eye movement, oxygen and carbon dioxide blood levels, heart rate and rhythm, breathing rate and rhythm, the flow of air through your mouth and nose, snoring, body muscle movements, and chest and belly movement.  Dr. Rennis GoldenHilty has prescribed Ambien 5mg  - take 1 tablet as needed for sleep   Your physician recommends that you schedule a follow-up appointment in: 2 months with Dr. Rennis GoldenHilty.

## 2015-04-19 ENCOUNTER — Encounter: Payer: Self-pay | Admitting: Internal Medicine

## 2015-04-19 ENCOUNTER — Telehealth: Payer: Self-pay | Admitting: Internal Medicine

## 2015-04-19 NOTE — Telephone Encounter (Signed)
He could try 10 mg, yes, but no more than that.  Dr. HRexene Edison

## 2015-04-19 NOTE — Telephone Encounter (Signed)
Pt called in stating that Dr.Hilty prescribed Ambien for him and he said that he only got 3 hours of sleep last night. He would like to know if his dosage could be increased. Please f/u with pt  Thanks

## 2015-04-19 NOTE — Telephone Encounter (Signed)
Outgoing call to patient seen yesterday in office w/ complaint of sleep problems.  He was prescribed zolpidem 5mg  PRN - took pill last night, states he only got about 3 hrs sleep.  He is inquiring if it would be OK to take 10mg  to see if this improves sleep pattern.  Advised remain on 5mg  for now, will route question to Dr. Rennis GoldenHilty for OK and return response.

## 2015-04-21 NOTE — Telephone Encounter (Signed)
Can this encounter be closed?

## 2015-04-21 NOTE — Telephone Encounter (Signed)
LMTC

## 2015-04-21 NOTE — Telephone Encounter (Signed)
Returning your call. °

## 2015-04-21 NOTE — Telephone Encounter (Signed)
Pt apprised of Dr. Blanchie DessertHilty's recommendations. Acknowledged understanding.

## 2015-04-28 ENCOUNTER — Telehealth: Payer: Self-pay | Admitting: Internal Medicine

## 2015-04-28 MED ORDER — ZOLPIDEM TARTRATE 5 MG PO TABS: 5.0000 mg | ORAL_TABLET | Freq: Every evening | ORAL | Status: DC | PRN

## 2015-04-28 NOTE — Telephone Encounter (Signed)
Patient has an appointment on 7/26 for his Remus Loffler and he will be out of them this weekend and is hoping you can call him in enough to get to his upcoming appointment. Patient is aware she is out till Monday. Pharmacy is [facility] on [address]

## 2015-04-28 NOTE — Telephone Encounter (Signed)
Medication printed and signed.

## 2015-04-28 NOTE — Telephone Encounter (Signed)
faxed

## 2015-05-02 ENCOUNTER — Ambulatory Visit (INDEPENDENT_AMBULATORY_CARE_PROVIDER_SITE_OTHER): Payer: Medicare Other | Admitting: Internal Medicine

## 2015-05-02 ENCOUNTER — Encounter: Payer: Self-pay | Admitting: Internal Medicine

## 2015-05-02 VITALS — BP 110/54 | HR 66 | Temp 98.3°F | Resp 16 | Ht 72.0 in | Wt 215.0 lb

## 2015-05-02 DIAGNOSIS — G4719 Other hypersomnia: Secondary | ICD-10-CM

## 2015-05-02 MED ORDER — ZOLPIDEM TARTRATE 10 MG PO TABS: 10.0000 mg | ORAL_TABLET | Freq: Every evening | ORAL | Status: DC | PRN

## 2015-05-02 NOTE — Assessment & Plan Note (Signed)
Checking on sleep study which appears to be scheduled for September. Okay with filling ambien until then. Talked to him about the risk of dependence and increased risk of falls, memory disturbance. He wishes to use ambien given his poor QOL without sleep. Ambien rx 10 mg # 30 for 2 refills given to him at visit today.

## 2015-05-02 NOTE — Patient Instructions (Signed)
We will have you use the ambien 10 mg for sleep until we can get the sleep study and see if there are better options for you.   We are checking on that and will work to get it scheduled.

## 2015-05-02 NOTE — Progress Notes (Signed)
Pre visit review using our clinic review tool, if applicable. No additional management support is needed unless otherwise documented below in the visit note. 

## 2015-05-02 NOTE — Progress Notes (Signed)
   Subjective:    Patient ID: Jeremy King, male    DOB: 27-Mar-1951, 64 y.o.   MRN: [id number]  HPI The patient is a 64 YO man coming in for insomnia. He has tried trazodone and melatonin in the past. Trazodone used to work well but now causes him to have orthostatic hypotension and syncope. He was tried on [place] which worked okay when he took 10 mg dose of it. Denies any drowsiness in the morning with it or confusion. No activities while asleep. Sleeping 3 hours per night without the ambien or other sleep aid. Melatonin did not help at all.   Review of Systems  Constitutional: Negative.   Respiratory: Negative.   Cardiovascular: Negative.   Gastrointestinal: Negative.   Psychiatric/Behavioral: Positive for sleep disturbance. Negative for suicidal ideas, hallucinations, confusion, dysphoric mood and decreased concentration. The patient is not nervous/anxious.       Objective:   Physical Exam  Constitutional: He is oriented to person, place, and time. He appears well-developed and well-nourished.  HENT:  Head: Normocephalic and atraumatic.  Eyes: EOM are normal.  Neck: Normal range of motion.  Cardiovascular: Normal rate.   Sounds regular but some skipped beats  Pulmonary/Chest: Effort normal and breath sounds normal. No respiratory distress. He has no wheezes. He has no rales.  Abdominal: Soft. Bowel sounds are normal. He exhibits no distension. There is no tenderness.  Musculoskeletal: He exhibits no edema.  Neurological: He is alert and oriented to person, place, and time.  Skin: Skin is warm and dry.  Psychiatric: He has a normal mood and affect.   Filed Vitals:   05/02/15 1007  BP: 110/54  Pulse: 66  Temp: 98.3 F (36.8 C)  TempSrc: Oral  Resp: 16  Height: 6' (1.829 m)  Weight: 215 lb (97.523 kg)  SpO2: 95%      Assessment & Plan:

## 2015-07-04 ENCOUNTER — Ambulatory Visit (HOSPITAL_BASED_OUTPATIENT_CLINIC_OR_DEPARTMENT_OTHER): Payer: Medicare Other

## 2015-07-19 ENCOUNTER — Telehealth: Payer: Self-pay | Admitting: *Deleted

## 2015-07-19 MED ORDER — CLONAZEPAM 1 MG PO TABS
1.0000 mg | ORAL_TABLET | Freq: Two times a day (BID) | ORAL | Status: DC | PRN
Start: 2015-07-19 — End: 2015-11-16

## 2015-07-19 NOTE — Telephone Encounter (Signed)
Receive call pt states he is needing a refill on his clonazepam. Pls send to [facility]/[address]...Raechel Chute/lmb

## 2015-07-19 NOTE — Telephone Encounter (Signed)
Notified pt md ok refill has been fax to [facility].../lmb 

## 2015-07-19 NOTE — Telephone Encounter (Signed)
Printed and signed, please fax.  

## 2015-07-20 ENCOUNTER — Other Ambulatory Visit: Payer: Self-pay | Admitting: Internal Medicine

## 2015-07-24 ENCOUNTER — Telehealth: Payer: Self-pay | Admitting: *Deleted

## 2015-07-24 MED ORDER — ZOLPIDEM TARTRATE 10 MG PO TABS: 10.0000 mg | ORAL_TABLET | Freq: Every evening | ORAL | Status: DC | PRN

## 2015-07-24 NOTE — Telephone Encounter (Signed)
Printed and signed, please fax.  

## 2015-07-24 NOTE — Telephone Encounter (Signed)
Called pt no answer LMOM rx fax to [facility].../lmb 

## 2015-07-24 NOTE — Telephone Encounter (Signed)
Left msg on triage requesting refills on his zolpidem...Raechel Chute/lmb

## 2015-07-31 ENCOUNTER — Telehealth: Payer: Self-pay | Admitting: Internal Medicine

## 2015-07-31 MED ORDER — SUVOREXANT 10 MG PO TABS: 10.0000 mg | ORAL_TABLET | Freq: Every evening | ORAL | Status: DC | PRN

## 2015-07-31 NOTE — Telephone Encounter (Signed)
Printed and signed for belsomra to replace the ambien. Please fax. Let him know that this medicine can take 1 week to get in him system and he should give it at least 1 week before deciding it does not work.

## 2015-07-31 NOTE — Telephone Encounter (Signed)
Please advise. Thanks.  

## 2015-07-31 NOTE — Telephone Encounter (Signed)
Patient states that zolpidem (AMBIEN) 10 MG tablet [[id number] is too expensive. Insurance gave him the alternatives of temazepam, zaleplon and belsomra.  Pharmacy is [facility] [address] He is totally out of medication

## 2015-07-31 NOTE — Telephone Encounter (Signed)
Left message for pt to call back  °

## 2015-08-17 ENCOUNTER — Other Ambulatory Visit: Payer: Self-pay | Admitting: Internal Medicine

## 2015-09-15 ENCOUNTER — Ambulatory Visit (INDEPENDENT_AMBULATORY_CARE_PROVIDER_SITE_OTHER): Payer: Medicare Other | Admitting: Internal Medicine

## 2015-09-15 ENCOUNTER — Encounter: Payer: Self-pay | Admitting: Internal Medicine

## 2015-09-15 VITALS — BP 122/80 | HR 61 | Temp 97.9°F | Resp 18 | Ht 72.0 in | Wt 228.0 lb

## 2015-09-15 DIAGNOSIS — G4719 Other hypersomnia: Secondary | ICD-10-CM | POA: Diagnosis not present

## 2015-09-15 MED ORDER — TEMAZEPAM 30 MG PO CAPS: 30.0000 mg | ORAL_CAPSULE | Freq: Every evening | ORAL | Status: DC | PRN

## 2015-09-15 NOTE — Progress Notes (Signed)
Pre visit review using our clinic review tool, if applicable. No additional management support is needed unless otherwise documented below in the visit note. 

## 2015-09-15 NOTE — Patient Instructions (Signed)
We are giving you temazepam to try for the sleeping. Take 1 pill around bedtime and give it 1 weeks or so to work. If it is not working call us and we can try a different medicine.

## 2015-09-16 NOTE — Progress Notes (Signed)
   Subjective:    Patient ID: Jeremy King, male    DOB: 1951-04-20, 64 y.o.   MRN: [id number]  HPI The patient is a 64 YO man coming in for insomnia. His insurance company forced him to switch and he has been trying belsomra for sleep. It is not working and he is lucky to get 1-2 hours of sleep per night. Previously he was on ambien 10 mg nightly which was helpful. Feels tired during the day and not himself since he is not sleeping.   Review of Systems  Constitutional: Negative.   Respiratory: Negative.   Cardiovascular: Negative.   Gastrointestinal: Negative.   Psychiatric/Behavioral: Positive for sleep disturbance. Negative for suicidal ideas, hallucinations, confusion, dysphoric mood and decreased concentration. The patient is not nervous/anxious.       Objective:   Physical Exam  Constitutional: He is oriented to person, place, and time. He appears well-developed and well-nourished.  HENT:  Head: Normocephalic and atraumatic.  Eyes: EOM are normal.  Neck: Normal range of motion.  Cardiovascular: Normal rate.   Sounds regular today  Pulmonary/Chest: Effort normal and breath sounds normal. No respiratory distress. He has no wheezes. He has no rales.  Abdominal: Soft. Bowel sounds are normal. He exhibits no distension. There is no tenderness.  Musculoskeletal: He exhibits no edema.  Neurological: He is alert and oriented to person, place, and time.  Skin: Skin is warm and dry.  Psychiatric: He has a normal mood and affect.   Filed Vitals:   09/15/15 0848  BP: 122/80  Pulse: 61  Temp: 97.9 F (36.6 C)  TempSrc: Oral  Resp: 18  Height: 6' (1.829 m)  Weight: 228 lb (103.42 kg)  SpO2: 95%      Assessment & Plan:

## 2015-09-16 NOTE — Assessment & Plan Note (Signed)
Will switch from belsomra to temazepam which is also a covered alternative to [place].

## 2015-09-18 ENCOUNTER — Other Ambulatory Visit: Payer: Self-pay

## 2015-09-18 ENCOUNTER — Ambulatory Visit (INDEPENDENT_AMBULATORY_CARE_PROVIDER_SITE_OTHER): Payer: Medicare Other | Admitting: Internal Medicine

## 2015-09-18 ENCOUNTER — Encounter: Payer: Self-pay | Admitting: Internal Medicine

## 2015-09-18 VITALS — BP 110/82 | HR 63 | Ht 72.0 in | Wt 231.0 lb

## 2015-09-18 DIAGNOSIS — I480 Paroxysmal atrial fibrillation: Secondary | ICD-10-CM | POA: Diagnosis not present

## 2015-09-18 NOTE — Progress Notes (Signed)
PCP: Myrlene BrokerElizabeth A Crawford, MD Primary Cardiologist:  Dr Michelle PiperHilty  Jeremy King is a 64 y.o. male who presents today for routine electrophysiology followup.  Since last being seen in our clinic, the patient reports doing very well.  He is very pleased with his results with multaq.  He has had no afib.  No presyncope or syncope. Today, he denies symptoms of palpitations, chest pain, shortness of breath,  lower extremity edema, or dizziness..  The patient is otherwise without complaint today.   Past Medical History  Diagnosis Date  . Kidney stones   . Paroxysmal atrial fibrillation (HCC)     chads2vasc score is at least 1  . Back ache   . Hypotension   . Syncope     due to hypotensin/ complicated by RVR with afib   Past Surgical History  Procedure Laterality Date  . Lithotripsy    . Cardioversion N/A 12/30/2014    Procedure: CARDIOVERSION;  Surgeon: Chrystie NoseKenneth C Hilty, MD;  [facility];  Service: Cardiovascular;  Laterality: N/A;  . Tee without cardioversion N/A 12/30/2014    Procedure: TRANSESOPHAGEAL ECHOCARDIOGRAM (TEE);  Surgeon: Chrystie NoseKenneth C Hilty, MD;  [facility];  Service: Cardiovascular;  Laterality: N/A;    ROS- all systems are reviewed and negatives except as per HPI above  Current Outpatient Prescriptions  Medication Sig Dispense Refill  . Ascorbic Acid (VITAMIN C) 1000 MG tablet Take 1,000 mg by mouth daily.    . clonazePAM (KLONOPIN) 1 MG tablet Take 1 tablet (1 mg total) by mouth 2 (two) times daily as needed for anxiety. 60 tablet 3  . ELIQUIS 5 MG TABS tablet TAKE 1 TABLET BY MOUTH TWICE DAILY 60 tablet 1  . Garlic 1000 MG CAPS Take 1 capsule by mouth daily.    [name]. ibuprofen (ADVIL,MOTRIN) 200 MG tablet Take 800 mg by mouth every 6 (six) hours as needed for moderate pain (pain).    . MULTAQ 400 MG tablet TAKE 1 TABLET BY MOUTH TWICE DAILY WITH MEALS 60 tablet 1  . multivitamin (ONE-A-DAY MEN'S) TABS tablet Take 1 tablet by mouth daily.    . Omega-3 Fatty  Acids (FISH OIL) 1000 MG CAPS Take 1 capsule by mouth 2 (two) times daily.     . temazepam (RESTORIL) 30 MG capsule Take 1 capsule (30 mg total) by mouth at bedtime as needed for sleep. 30 capsule 2  . venlafaxine XR (EFFEXOR-XR) 150 MG 24 hr capsule Take 1 capsule (150 mg total) by mouth daily with breakfast. 30 capsule 6   No current facility-administered medications for this visit.    Physical Exam: Filed Vitals:   09/18/15 1106  BP: 110/82  Pulse: 63  Height: 6' (1.829 m)  Weight: 231 lb (104.781 kg)    GEN- The patient is well appearing, alert and oriented x 3 today.   Head- normocephalic, atraumatic Eyes-  Sclera clear, conjunctiva pink Ears- hearing intact Oropharynx- clear Lungs- Clear to ausculation bilaterally, normal work of breathing Heart- Regular rate and rhythm, no murmurs, rubs or gallops, PMI not laterally displaced GI- soft, NT, ND, + BS Extremities- no clubbing, cyanosis, or edema  ekg today reveals sinus rhythm, RBBB  Assessment and Plan:  1. afib Well controlled with multaq No changes at this time  2. Syncope No further episodes Adequate hydration encouraged  Follow-up with Dr Rennis GoldenHilty in 6 months Follow-up in AF clinic in 1 year I will see when needed going forward  Hillis RangeJames Covey Baller MD, [facility] 09/18/2015 10:23 PM

## 2015-09-18 NOTE — Patient Instructions (Signed)
Medication Instructions:  Your physician recommends that you continue on your current medications as directed. Please refer to the Current Medication list given to you today.   Labwork: None ordered   Testing/Procedures: None ordered   Follow-Up: Your physician wants you to follow-up in: 6 months with Dr Rennis GoldenHilty and 12 months with Rudi Cocoonna Carroll, NP You will receive a reminder letter in the mail two months in advance. If you don't receive a letter, please call our office to schedule the follow-up appointment.   Any Other Special Instructions Will Be Listed Below (If Applicable).     If you need a refill on your cardiac medications before your next appointment, please call your pharmacy.

## 2015-09-22 ENCOUNTER — Other Ambulatory Visit: Payer: Self-pay | Admitting: Internal Medicine

## 2015-10-13 ENCOUNTER — Other Ambulatory Visit: Payer: Self-pay | Admitting: Internal Medicine

## 2015-11-16 ENCOUNTER — Other Ambulatory Visit: Payer: Self-pay | Admitting: Internal Medicine

## 2015-11-16 NOTE — Telephone Encounter (Signed)
Sent to pharmacy 

## 2015-12-22 ENCOUNTER — Telehealth: Payer: Self-pay | Admitting: *Deleted

## 2015-12-22 MED ORDER — TEMAZEPAM 30 MG PO CAPS: 30.0000 mg | ORAL_CAPSULE | Freq: Every evening | ORAL | Status: DC | PRN

## 2015-12-22 NOTE — Telephone Encounter (Signed)
Left msg on triage stating needing refill on his Temazepam. Wanting to also see if he is about to take two pills at bedtime...Raechel Chute/lmb

## 2015-12-22 NOTE — Telephone Encounter (Signed)
No he should not take 2 pills at bedtime.

## 2015-12-22 NOTE — Telephone Encounter (Signed)
Notified pt with md response rx fax to [facility]...Raechel Chute/lmb

## 2016-01-10 ENCOUNTER — Other Ambulatory Visit: Payer: Self-pay | Admitting: Internal Medicine

## 2016-03-15 ENCOUNTER — Other Ambulatory Visit: Payer: Self-pay | Admitting: Internal Medicine

## 2016-03-15 NOTE — Telephone Encounter (Signed)
Faxed to pharmacy

## 2016-03-18 ENCOUNTER — Other Ambulatory Visit: Payer: Self-pay | Admitting: Internal Medicine

## 2016-03-18 NOTE — Telephone Encounter (Signed)
Faxed script back to [facility].../lmb 

## 2016-03-28 IMAGING — CT CT HEAD W/O CM
2 series · 17 of 30 positions shown, 20 images · non-contrast
Comparison: 01/21/2006

CLINICAL DATA: Fell in kitchen at home, hit post rt head, prev ct
head 01/21/06 also s/p fall

EXAM:
CT HEAD WITHOUT CONTRAST
TECHNIQUE: Contiguous axial images were obtained from the base of the skull
through the vertex without intravenous contrast.

[Series 2: head w/o · axial · non-contrast · 0.46mm/px · z∈[-104,+16]mm · 9 of 31 slices shown, 12 images]
[im 4/31  brain]
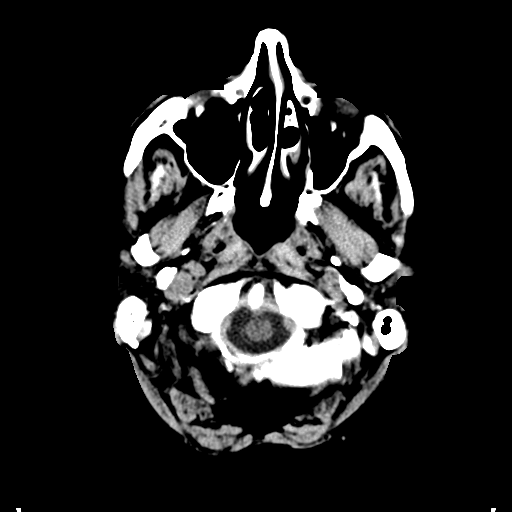
[im 4/31  bone]
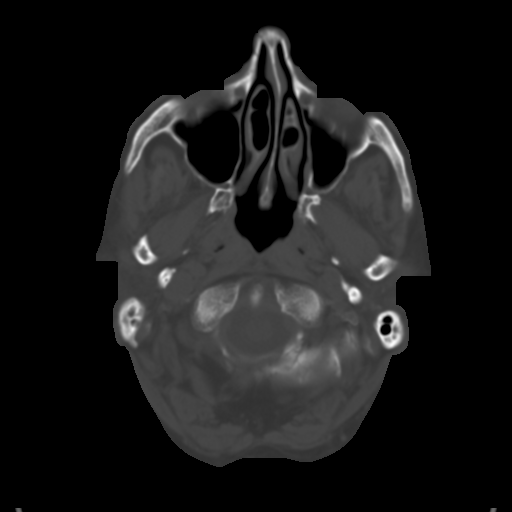
[im 7/31  brain]
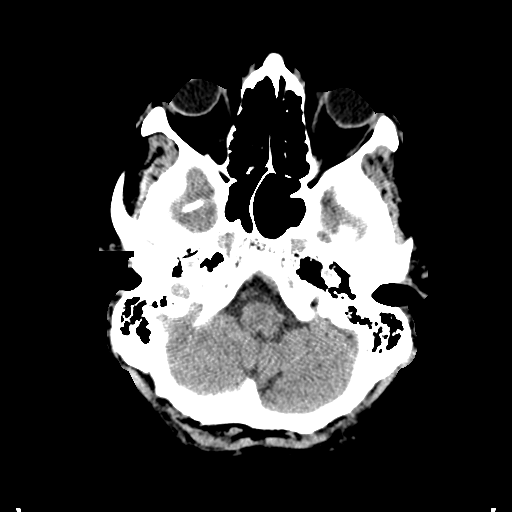
[im 10/31  brain]
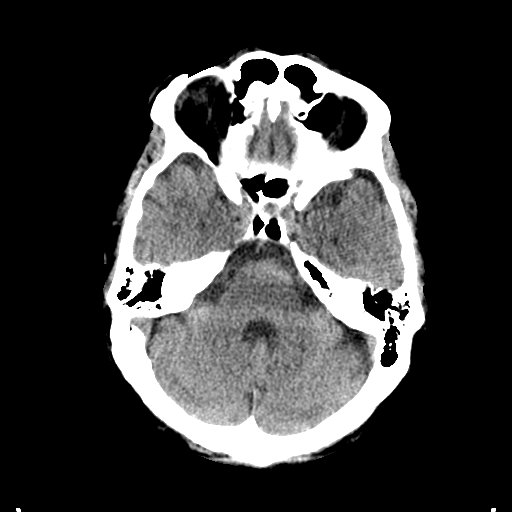
[im 13/31  brain]
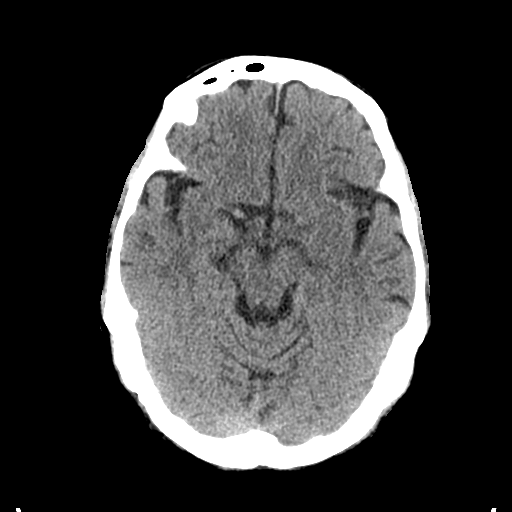
[im 16/31  brain]
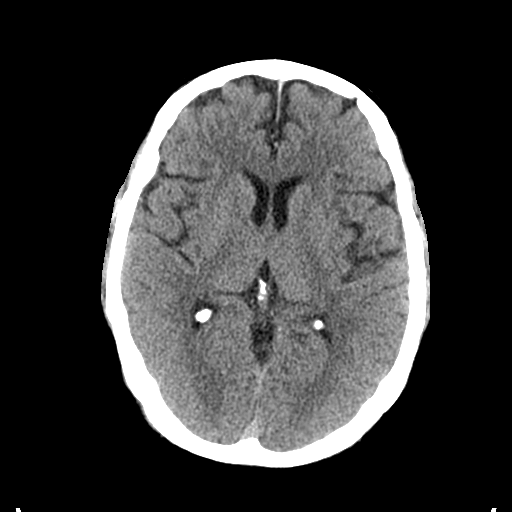
[im 16/31  bone]
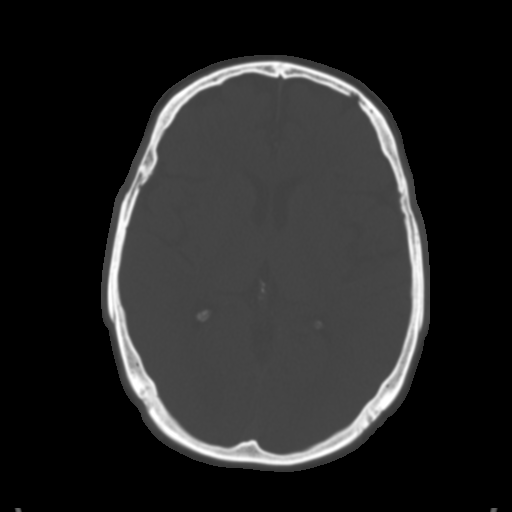
[im 19/31  brain]
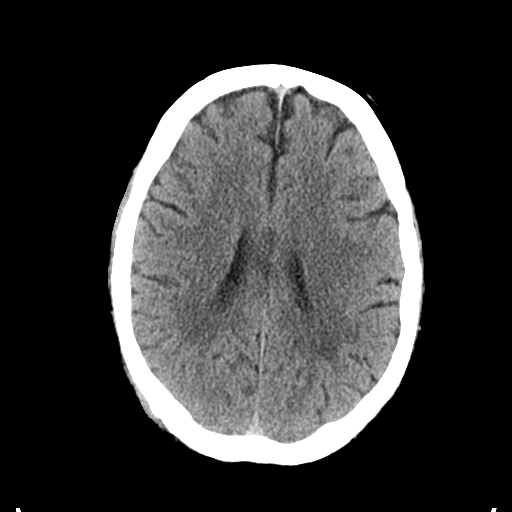
[im 22/31  brain]
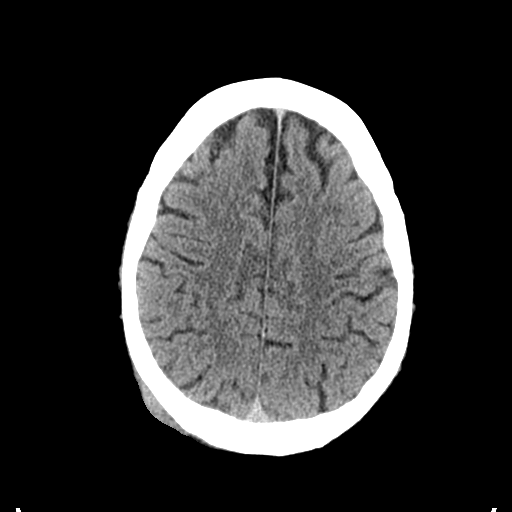
[im 25/31  brain]
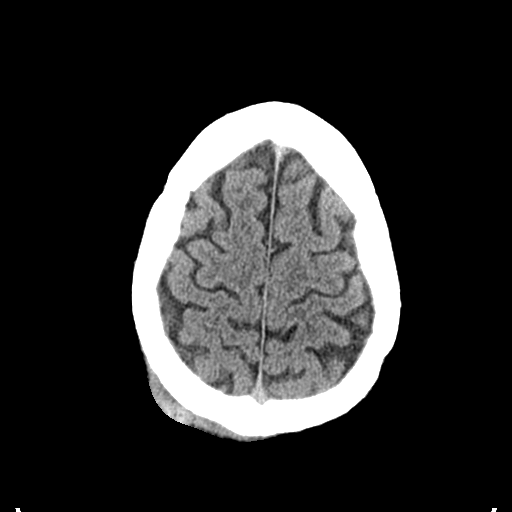
[im 28/31  brain]
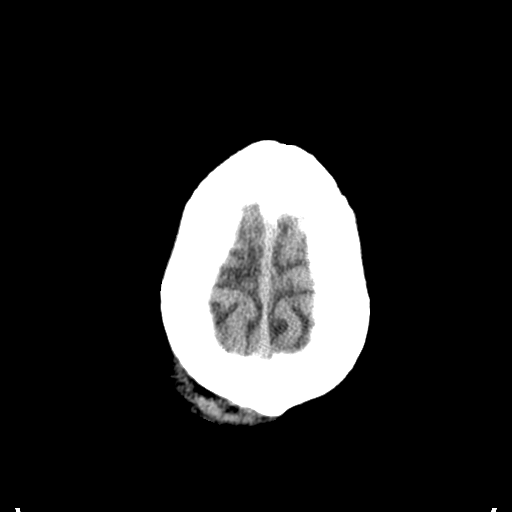
[im 28/31  bone]
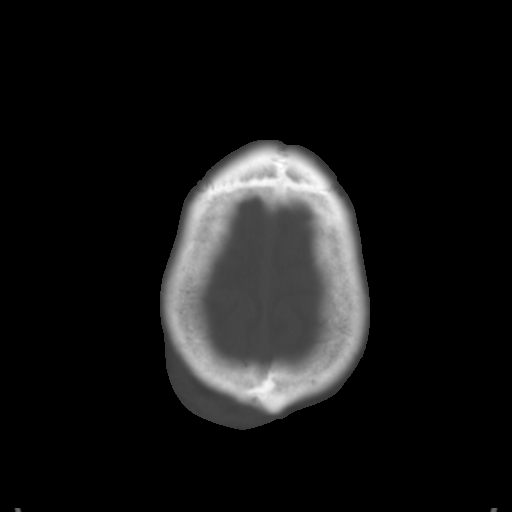

[Series 3: bone windows · axial · 0.46mm/px · z∈[-104,+16]mm · 8 of 52 slices shown]
[im 6/52  bone]
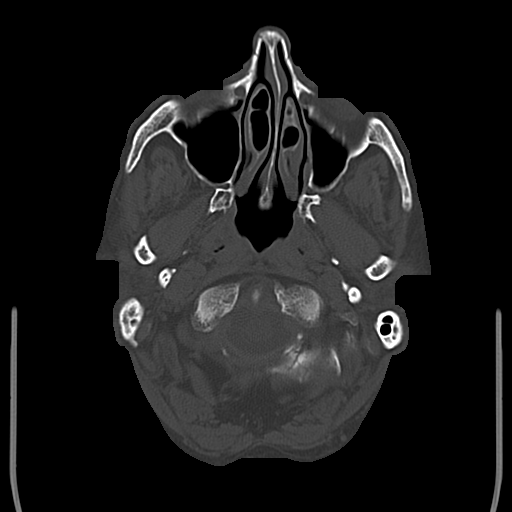
[im 12/52  bone]
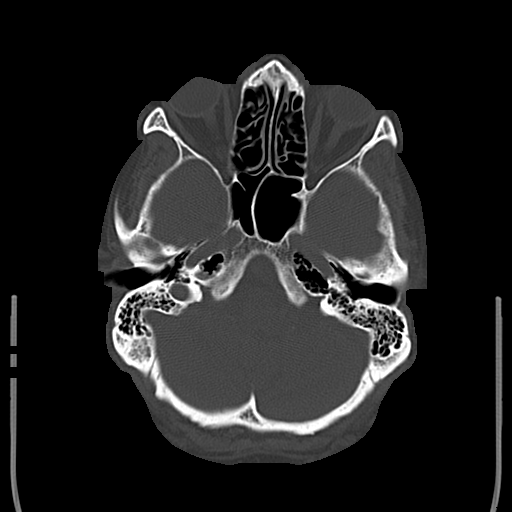
[im 18/52  bone]
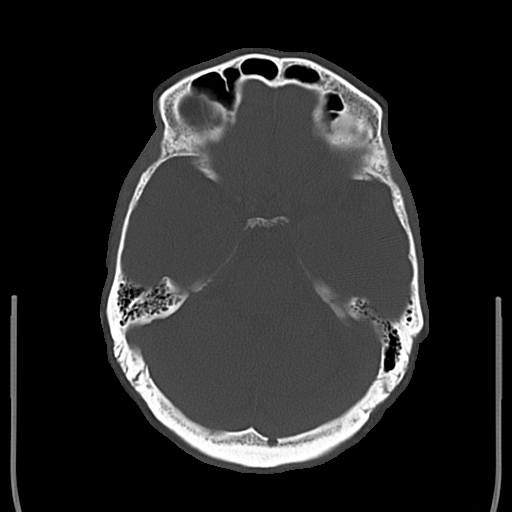
[im 23/52  bone]
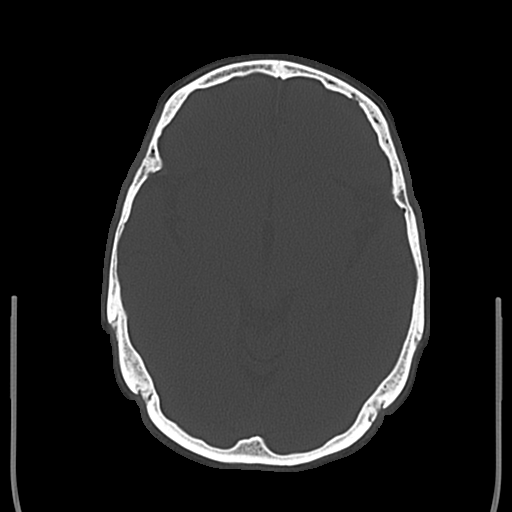
[im 29/52  bone]
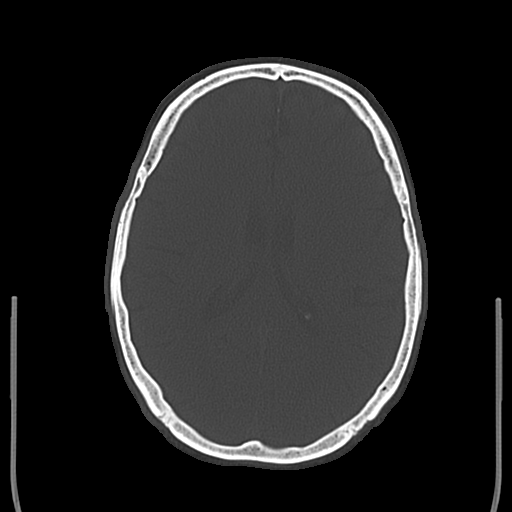
[im 35/52  bone]
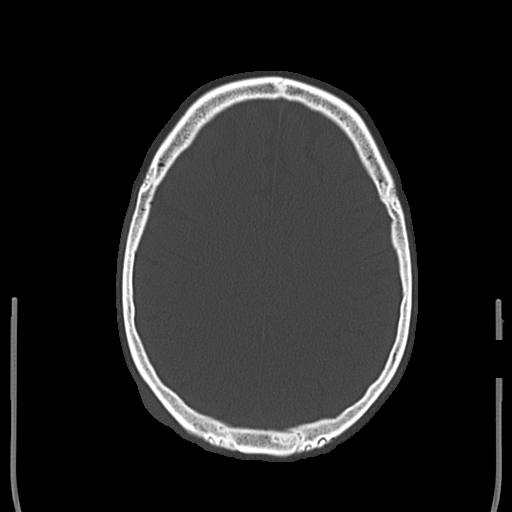
[im 40/52  bone]
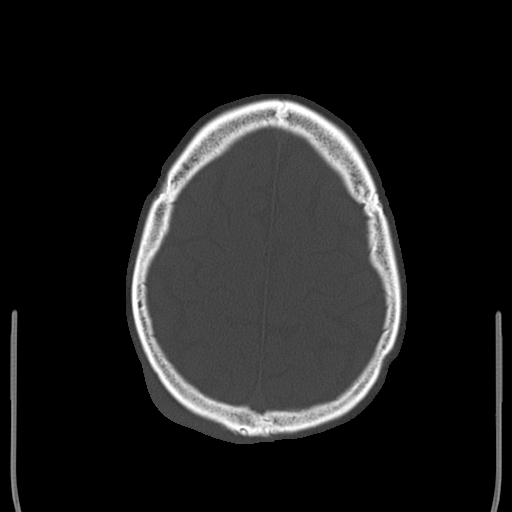
[im 46/52  bone]
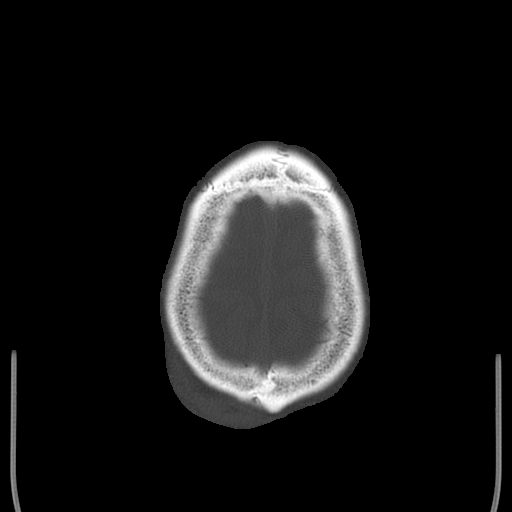

[17 of 30 positions shown; findings below may reference images not displayed]

FINDINGS: Right parietal scalp hematoma.  There is no underlying fracture.

Ventricles are normal in size and configuration. There are no
parenchymal masses or mass effect. There is no evidence of a
cortical infarct. Patchy areas of white matter hypoattenuation are
noted consistent with mild chronic microvascular ischemic change.

There are no extra-axial masses or abnormal fluid collections.

No intracranial hemorrhage.

Visualized sinuses and mastoid air cells are clear.
IMPRESSION: 1. No acute intracranial abnormalities.  No skull fracture.
2. Right parietal scalp hematoma.

## 2016-04-06 ENCOUNTER — Other Ambulatory Visit: Payer: Self-pay | Admitting: Internal Medicine

## 2016-06-10 ENCOUNTER — Other Ambulatory Visit: Payer: Self-pay | Admitting: Internal Medicine

## 2016-06-18 ENCOUNTER — Other Ambulatory Visit: Payer: Self-pay | Admitting: Internal Medicine

## 2016-06-19 ENCOUNTER — Other Ambulatory Visit: Payer: Self-pay | Admitting: Internal Medicine

## 2016-06-19 ENCOUNTER — Telehealth: Payer: Self-pay | Admitting: *Deleted

## 2016-06-19 NOTE — Telephone Encounter (Signed)
Pt called back about getting his clonazePAM (KLONOPIN) 1 MG tablet refill again. [facility] [address] Please advise thanks.

## 2016-06-19 NOTE — Telephone Encounter (Signed)
Rec'd call pt requesting refill on his Clonazepam.../lmb

## 2016-06-20 MED ORDER — CLONAZEPAM 1 MG PO TABS: 1.0000 mg | ORAL_TABLET | Freq: Two times a day (BID) | ORAL | 3 refills | Status: DC | PRN

## 2016-06-20 NOTE — Telephone Encounter (Signed)
Called pt no answer LMOM rx fax to [facility].../lmb 

## 2016-06-26 ENCOUNTER — Encounter: Payer: Self-pay | Admitting: Internal Medicine

## 2016-07-12 ENCOUNTER — Other Ambulatory Visit: Payer: Self-pay | Admitting: Internal Medicine

## 2016-07-18 ENCOUNTER — Other Ambulatory Visit: Payer: Self-pay | Admitting: Internal Medicine

## 2016-07-18 ENCOUNTER — Telehealth: Payer: Self-pay

## 2016-07-18 NOTE — Telephone Encounter (Signed)
Sent to pharmacy 

## 2016-07-18 NOTE — Telephone Encounter (Signed)
Please let him know that we cannot prescribe controlled substances without visit within 12 months so his clonazepam and temazepam would not be able to be refilled after December without a visit per law.

## 2016-07-19 NOTE — Telephone Encounter (Signed)
Called patient, unable to reach. Left message to give us a call back. He can speak to amy or myself.

## 2016-08-11 ENCOUNTER — Other Ambulatory Visit: Payer: Self-pay | Admitting: Internal Medicine

## 2016-09-15 ENCOUNTER — Other Ambulatory Visit: Payer: Self-pay | Admitting: Internal Medicine

## 2016-09-17 ENCOUNTER — Telehealth: Payer: Self-pay | Admitting: Internal Medicine

## 2016-09-17 NOTE — Telephone Encounter (Signed)
New message  1. Eliquis 5mg  1 tab 2x day 2. [facility] [address] 3. 30 day supply

## 2016-09-19 ENCOUNTER — Ambulatory Visit (INDEPENDENT_AMBULATORY_CARE_PROVIDER_SITE_OTHER): Payer: Medicare Other | Admitting: Internal Medicine

## 2016-09-19 ENCOUNTER — Encounter: Payer: Self-pay | Admitting: Internal Medicine

## 2016-09-19 VITALS — BP 126/56 | HR 73 | Ht 72.0 in | Wt 246.6 lb

## 2016-09-19 DIAGNOSIS — R5383 Other fatigue: Secondary | ICD-10-CM

## 2016-09-19 DIAGNOSIS — I480 Paroxysmal atrial fibrillation: Secondary | ICD-10-CM

## 2016-09-19 DIAGNOSIS — R001 Bradycardia, unspecified: Secondary | ICD-10-CM | POA: Diagnosis not present

## 2016-09-19 DIAGNOSIS — R55 Syncope and collapse: Secondary | ICD-10-CM | POA: Diagnosis not present

## 2016-09-19 NOTE — Progress Notes (Signed)
OFFICE NOTE  Chief Complaint:  Poor sleep, fatigue  Primary Care Physician: Jeremy Broker, MD  HPI:  Jeremy King is a pleasant 65 year old male with few medical problems. He has a history of anxiety as well as dyslipidemia. Unfortunately he's had a number of syncopal episodes and has known orthostatic hypotension. He tells me that couple years ago he was seen at the [facility] and syncopized. Blood pressure was noted to be 60 over palpation. He was hospitalized for this and treated but is had recurrent hypotension. He was previously on Flomax which was discontinued. He does have a history of kidney stones with what sounds like ureteral stenting and/or lithotripsy. He probably saw a cardiologist once at the [place] and he tells me that they did not recommend any different medications. His CHADSVASC score is 1 given his age of 29 and hypertension. He has been maintained on aspirin. Recently he was seen in [facility] after a syncopal episode. He underwent cardiac workup including echocardiogram which shows normal LV function. He was noted to be in A. fib at that time. He says that it comes and goes but is not clear whether it has become more persistent. He's never had a cardioversion before. He denies significant alcohol use but has been a long-term smoker. There also is a strong family history of coronary disease in both parents. Recently he's been having problems with his knee and is contemplating left knee surgery with Dr. Ciro King.  It should be noted in the office today that he became dizzy after sitting up for his EKG. Orthostatic blood pressures were taken and he had a 20 point drop in systolic blood pressure with change in position.  I saw Jeremy King back in the office today. Unfortunately his home monitor has been alarming as he's had frequent atrial fibrillation with rapid ventricular response. Heart rates have been up into the 180s. He also had a near-syncopal episode. He  unfortunately did not check his blood pressure at that time. He's been pretty much in persistent A. fib since we placed the monitor. I think this is at least contributing to his symptoms. 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 Exam is not consistent with heart failure. Blood pressure is slightly improved today at 100 systolic, however it is limited any AV nodal blocking medications. He's currently only on aspirin because of a low CHADSVASC score.  Jeremy King returns today for follow-up. He successfully cardioverted and is maintaining sinus bradycardia. He's worn a Holter monitor which demonstrates several episodes of significant bradycardia into the 30s. He's had some sinus pauses of 2-1/2-3 seconds, which appear mostly at night. Laboratory work is unremarkable and digoxin level is 1.7. The digoxin, again was started for heart rate control prior to cardioversion as his blood pressure was less than 90 systolic with significant orthostasis. I also discontinued his trazodone which seems to have significantly improved his orthostasis. Blood pressure remains low today at 98/50. He underwent a nuclear stress test which showed 2 small fixed defects which were thought to be either small areas of scar or artifact. His echo showed normal wall motion and preserved systolic function. He is not describing anginal symptoms.  Jeremy King returns today for follow-up. He has since seen Jeremy King who put him on multiecho for control of his atrial fibrillation. Overall he feels that he may be doing well with this although yesterday he thought his heart rate was irregular. From what he described it sounds  like it was regularly irregular, this may not be atrial fibrillation. He seems to be tolerating this and the Eliquis without any bleeding problems. He is come off of trazodone due to problems with orthostatic hypotension and syncope. He's not had any further episodes. He is having problems sleeping at night  though. He was on melatonin which was initially helpful but however since it has not been helpful. In the past we talked about the possibility of obstructive sleep apnea or other sleep related disorder. He was hesitant to get a sleep study but now seems to be interested in that.  09/19/2016  Jeremy King returns for follow-up. Overall he is doing well without recurrent atrial fibrillation. He is maintained on multiecho. She is EKG shows sinus rhythm with a PAC an incomplete right bundle branch block. QTC is 437 ms. He is on Eliquis and denies any significant bleeding problems. He uses melatonin for sleep at night and takes 15 mg. Is generally helps him get 5-6 hours of sleep. Niacinate chest pain or worsening shortness of breath.  PMHx:  Past Medical History:  Diagnosis Date  . Back ache   . Hypotension   . Kidney stones   . Paroxysmal atrial fibrillation (HCC)    chads2vasc score is at least 1  . Syncope    due to hypotensin/ complicated by RVR with afib    Past Surgical History:  Procedure Laterality Date  . CARDIOVERSION N/A 12/30/2014   Procedure: CARDIOVERSION;  Surgeon: Jeremy NoseKenneth C Anton Cheramie, MD;  [facility];  Service: Cardiovascular;  Laterality: N/A;  . LITHOTRIPSY    . TEE WITHOUT CARDIOVERSION N/A 12/30/2014   Procedure: TRANSESOPHAGEAL ECHOCARDIOGRAM (TEE);  Surgeon: Jeremy NoseKenneth C Madell Heino, MD;  [facility];  Service: Cardiovascular;  Laterality: N/A;    FAMHx:  Family History  Problem Relation Age of Onset  . Heart failure Mother     mitral valve disease  . Diabetes Father   . Heart failure Father     MI  . Hypertension Father   . CAD Father   . Alcoholism Father   . Cancer Sister     SOCHx:   reports that he has been smoking Cigarettes.  He has been smoking about 1.00 pack per day. He has quit using smokeless tobacco. He reports that he does not drink alcohol or use drugs.  ALLERGIES:  Allergies  Allergen Reactions  . Sertraline Hcl     Extreme  headaches  . Trazodone And Nefazodone Other (See Comments)    Irregular heart beat? Hypotension?    ROS: Pertinent items noted in HPI and remainder of comprehensive ROS otherwise negative.  HOME MEDS: Current Outpatient Prescriptions  Medication Sig Dispense Refill  . apixaban (ELIQUIS) 5 MG TABS tablet Take 1 tablet by mouth twice daily, need OV for further refills 60 tablet 0  . Ascorbic Acid (VITAMIN C) 1000 MG tablet Take 1,000 mg by mouth daily.    . clonazePAM (KLONOPIN) 1 MG tablet Take 1 tablet (1 mg total) by mouth 2 (two) times daily as needed. for anxiety 60 tablet 3  . Garlic 1000 MG CAPS Take 1 capsule by mouth daily.    [name]. ibuprofen (ADVIL,MOTRIN) 200 MG tablet Take 800 mg by mouth every 6 (six) hours as needed for moderate pain (pain).    . Melatonin 1 MG TABS Take 1 tablet by mouth daily.    . MULTAQ 400 MG tablet TAKE 1 TABLET BY MOUTH TWICE DAILY WITH MEALS 60 tablet 11  .  multivitamin (ONE-A-DAY MEN'S) TABS tablet Take 1 tablet by mouth daily.    . Omega-3 Fatty Acids (FISH OIL) 1000 MG CAPS Take 1 capsule by mouth 2 (two) times daily.     [name]. venlafaxine XR (EFFEXOR-XR) 150 MG 24 hr capsule TAKE ONE CAPSULE BY MOUTH DAILY WITH BREAKFAST 90 capsule 0  . temazepam (RESTORIL) 30 MG capsule TAKE 1 CAPSULE BY MOUTH EVERY NIGHT AT BEDTIME AS NEEDED FOR SLEEP (Patient not taking: Reported on 09/19/2016) 30 capsule 0   No current facility-administered medications for this visit.     LABS/IMAGING: No results found for this or any previous visit (from the past 48 hour(s)). No results found.  VITALS: BP (!) 126/56   Pulse 73   Ht 6' (1.829 m)   Wt 246 lb 9.6 oz (111.9 kg)   SpO2 96%   BMI 33.44 kg/m   EXAM: General appearance: alert, fatigued and no distress Neck: no carotid bruit, no JVD and thyroid not enlarged, symmetric, no tenderness/mass/nodules Lungs: clear to auscultation bilaterally Heart: regular rate and rhythm, S1, S2 normal, no murmur, click, rub or  gallop Abdomen: soft, non-tender; bowel sounds normal; no masses,  no organomegaly Extremities: extremities normal, atraumatic, no cyanosis or edema Pulses: 2+ and symmetric Skin: Skin color, texture, turgor normal. No rashes or lesions Psych: Anxious  EKG: Sinus rhythm with PAC's at 63, QTc 437 msec  ASSESSMENT: 1. Atrial fibrillation - maintaining sinus on Multaq after cardioversion (CHADSVASC 2) 2. Normal LV function by recent echocardiogram 3. Remote syncopal episodes 4. History of bradycardia with pauses 5. Fatigue, non-restorative sleep  PLAN: 1.   Jeremy King says he's been doing very well without any recurrent syncope. He has not had any significant bradycardia. He's tolerating multiecho without any adverse side effects. EKG stable. He has no heart failure symptoms. He is on Eliquis for CHADSVASC score of 2 and has not had any recurrent atrial fibrillation that he's aware of. Blood pressure is well-controlled. He uses melatonin which helps him sleep at night. Overall is doing well we'll plan to see him back annually or sooner as necessary.  Jeremy NoseKenneth C. Wasim Hurlbut, MD, [facility] Attending Cardiologist [facility]  Jeremy King 09/19/2016, 10:58 AM

## 2016-09-19 NOTE — Patient Instructions (Signed)
Your physician wants you to follow-up in: ONE YEAR with Dr. Hilty. You will receive a reminder letter in the mail two months in advance. If you don't receive a letter, please call our office to schedule the follow-up appointment.  

## 2016-09-20 MED ORDER — APIXABAN 5 MG PO TABS: ORAL_TABLET | ORAL | 11 refills | Status: DC

## 2016-09-20 NOTE — Telephone Encounter (Signed)
Duplicate, see below  Pharmacy is calling back on Rx below  Please follow up

## 2016-09-20 NOTE — Telephone Encounter (Signed)
Rx(s) sent to pharmacy electronically.  

## 2016-10-04 ENCOUNTER — Encounter (HOSPITAL_COMMUNITY): Payer: Self-pay | Admitting: *Deleted

## 2016-10-10 ENCOUNTER — Other Ambulatory Visit: Payer: Self-pay | Admitting: Internal Medicine

## 2016-10-17 ENCOUNTER — Other Ambulatory Visit: Payer: Self-pay | Admitting: Internal Medicine

## 2016-10-17 NOTE — Telephone Encounter (Signed)
Called and left message to call and make an appointment before refill can be considered

## 2016-10-18 NOTE — Telephone Encounter (Signed)
Pt called back and left message on triage. Pt has an appt for 10/22/2016. Please advise if refill is appropriate.

## 2016-10-18 NOTE — Telephone Encounter (Signed)
Refill is not appropriate. Needs visit. Last 30 day supply was sent in December and needed visit after that for refills.

## 2016-10-18 NOTE — Telephone Encounter (Signed)
Has visit scheduled

## 2016-10-22 ENCOUNTER — Ambulatory Visit (INDEPENDENT_AMBULATORY_CARE_PROVIDER_SITE_OTHER): Payer: Medicare Other | Admitting: Internal Medicine

## 2016-10-22 ENCOUNTER — Encounter: Payer: Self-pay | Admitting: Internal Medicine

## 2016-10-22 DIAGNOSIS — F411 Generalized anxiety disorder: Secondary | ICD-10-CM

## 2016-10-22 MED ORDER — CLONAZEPAM 1 MG PO TABS: 1.0000 mg | ORAL_TABLET | Freq: Two times a day (BID) | ORAL | 3 refills | Status: DC | PRN

## 2016-10-22 NOTE — Progress Notes (Signed)
Pre visit review using our clinic review tool, if applicable. No additional management support is needed unless otherwise documented below in the visit note. 

## 2016-10-22 NOTE — Progress Notes (Signed)
   Subjective:    Patient ID: Jeremy MoraleKimon R King, male    DOB: 1950-11-19, 66 y.o.   MRN: [id number]  HPI The patient is a 66 YO man coming in for follow up of his anxiety. He has been taking effexor and using clonazepam BID as needed for anxiety. Since he has not been in for more than 1 year refills were stopped. He has not done well being off the medication for 3 days. He is now off his sleeping medication and after about 2-3 weeks his sleep pattern has returned to normal with getting 6 hours sleep per night.   Review of Systems  Constitutional: Positive for activity change. Negative for appetite change, chills, fatigue, fever and unexpected weight change.  Respiratory: Negative.   Cardiovascular: Negative.   Gastrointestinal: Negative.   Musculoskeletal: Negative.   Skin: Negative.   Psychiatric/Behavioral: Positive for agitation and dysphoric mood. Negative for behavioral problems, confusion, decreased concentration, hallucinations, self-injury, sleep disturbance and suicidal ideas. The patient is nervous/anxious. The patient is not hyperactive.       Objective:   Physical Exam  Constitutional: He is oriented to person, place, and time. He appears well-developed and well-nourished.  HENT:  Head: Normocephalic and atraumatic.  Eyes: EOM are normal.  Neck: Normal range of motion.  Cardiovascular: Normal rate and regular rhythm.   Pulmonary/Chest: Effort normal. No respiratory distress. He has no wheezes. He has no rales.  Abdominal: Soft.  Neurological: He is alert and oriented to person, place, and time.  Skin: Skin is warm and dry.   Vitals:   10/22/16 1012  BP: 130/86  Pulse: 97  Resp: 16  Temp: 97.9 F (36.6 C)  TempSrc: Oral  SpO2: 99%  Weight: 244 lb (110.7 kg)  Height: 6' (1.829 m)      Assessment & Plan:

## 2016-10-22 NOTE — Patient Instructions (Signed)
We have sent in the clonazepam for you.

## 2016-10-22 NOTE — Assessment & Plan Note (Signed)
Refilled his clonazepam and will continue the effexor.

## 2016-11-14 ENCOUNTER — Telehealth: Payer: Self-pay | Admitting: Internal Medicine

## 2016-11-14 NOTE — Telephone Encounter (Signed)
Called patient to schedule awv appt. Left msg for patient to call office to schedule appt.  °

## 2016-12-10 ENCOUNTER — Other Ambulatory Visit: Payer: Self-pay | Admitting: Internal Medicine

## 2016-12-12 ENCOUNTER — Other Ambulatory Visit: Payer: Self-pay | Admitting: Internal Medicine

## 2017-02-25 ENCOUNTER — Other Ambulatory Visit: Payer: Self-pay | Admitting: Internal Medicine

## 2017-03-15 ENCOUNTER — Other Ambulatory Visit: Payer: Self-pay | Admitting: Internal Medicine

## 2017-05-27 ENCOUNTER — Other Ambulatory Visit: Payer: Self-pay | Admitting: Internal Medicine

## 2017-05-27 NOTE — Telephone Encounter (Signed)
Check  registry last filled 04/25/2017...Raechel Chute

## 2017-05-28 NOTE — Telephone Encounter (Signed)
Rx faxed

## 2017-06-16 ENCOUNTER — Encounter: Payer: Medicare Other | Admitting: Internal Medicine

## 2017-06-18 ENCOUNTER — Other Ambulatory Visit: Payer: Self-pay | Admitting: Family

## 2017-06-20 ENCOUNTER — Encounter: Payer: Medicare Other | Admitting: Internal Medicine

## 2017-06-23 ENCOUNTER — Other Ambulatory Visit: Payer: Self-pay | Admitting: Internal Medicine

## 2017-06-24 NOTE — Telephone Encounter (Signed)
Faxed to [facility] on [address]

## 2017-07-25 ENCOUNTER — Other Ambulatory Visit: Payer: Self-pay | Admitting: Internal Medicine

## 2017-07-28 NOTE — Telephone Encounter (Signed)
[facility] [address]

## 2017-08-25 ENCOUNTER — Other Ambulatory Visit: Payer: Self-pay | Admitting: Internal Medicine

## 2017-09-02 NOTE — Progress Notes (Signed)
Tawana ScaleZach King D.O. [facility] [address] [place], [place] [address] Phone: [phone]) [phone] Subjective:    I'm seeing this patient by the request  of:    CC: left shoulder pain   BJY:NWGNFAOZHYHPI:Subjective  Jeremy R [name] is a 66 y.o. male coming in with complaint of bilateral shoulder pain. Left worse than right. Patient states he is getting numbness and tingling in his finger tips. Has indentation/deformity in upper arm (injured in August). States he has carpal tunnel. He says that when he picks things up his wrist are very weak. He feels a snap. Both shoulders snap.   Onset- Chronic  Location- The whole shoulder Duration-  Character-  Aggravating factors- overhead lifting, reaching- pain shooting up shoulder  Reliving factors- Ibuprofen  Therapies tried-  Severity-     Past Medical History:  Diagnosis Date  . Back ache   . Hypotension   . Kidney stones   . Paroxysmal atrial fibrillation (HCC)    chads2vasc score is at least 1  . Syncope    due to hypotensin/ complicated by RVR with afib   Past Surgical History:  Procedure Laterality Date  . CARDIOVERSION N/A 12/30/2014   Procedure: CARDIOVERSION;  Surgeon: Chrystie NoseKenneth C Hilty, MD;  [facility];  Service: Cardiovascular;  Laterality: N/A;  . LITHOTRIPSY    . TEE WITHOUT CARDIOVERSION N/A 12/30/2014   Procedure: TRANSESOPHAGEAL ECHOCARDIOGRAM (TEE);  Surgeon: Chrystie NoseKenneth C Hilty, MD;  [facility];  Service: Cardiovascular;  Laterality: N/A;   Social History   Socioeconomic History  . Marital status: Legally Separated    Spouse name: Not on file  . Number of children: 2  . Years of education: 5115  . Highest education level: Not on file  Social Needs  . Financial resource strain: Not on file  . Food insecurity - worry: Not on file  . Food insecurity - inability: Not on file  . Transportation needs - medical: Not on file  . Transportation needs - non-medical: Not on file  Occupational History  . Not on  file  Tobacco Use  . Smoking status: Current Every Day Smoker    Packs/day: 1.00    Types: Cigarettes  . Smokeless tobacco: Former NeurosurgeonUser  . Tobacco comment: approx 1ppd - maybe a little less (04/18/15)  Substance and Sexual Activity  . Alcohol use: No    Alcohol/week: 0.0 oz  . Drug use: No  . Sexual activity: Not on file  Other Topics Concern  . Not on file  Social History Narrative  . Not on file   Allergies  Allergen Reactions  . Sertraline Hcl     Extreme headaches  . Trazodone And Nefazodone Other (See Comments)    Irregular heart beat? Hypotension?   Family History  Problem Relation Age of Onset  . Heart failure Mother        mitral valve disease  . Diabetes Father   . Heart failure Father        MI  . Hypertension Father   . CAD Father   . Alcoholism Father   . Cancer Sister      Past medical history, social, surgical and family history all reviewed in electronic medical record.  No pertanent information unless stated regarding to the chief complaint.   Review of Systems:Review of systems updated and as accurate as of 09/02/17  No headache, visual changes, nausea, vomiting, diarrhea, constipation, dizziness, abdominal pain, skin rash, fevers, chills, night sweats, weight loss, swollen lymph nodes,  body aches, joint swelling, muscle aches, chest pain, shortness of breath, mood changes.   Objective  There were no vitals taken for this visit. Systems examined below as of 09/02/17   General: No apparent distress alert and oriented x3 mood and affect normal, dressed appropriately.  HEENT: Pupils equal, extraocular movements intact  Respiratory: Patient's speak in full sentences and does not appear short of breath  Cardiovascular: No lower extremity edema, non tender, no erythema  Skin: Warm dry intact with no signs of infection or rash on extremities or on axial skeleton.  Abdomen: Soft nontender  Neuro: Cranial nerves II through XII are intact, neurovascularly  intact in all extremities with 2+ DTRs and 2+ pulses.  Lymph: No lymphadenopathy of posterior or anterior cervical chain or axillae bilaterally.  Gait normal with good balance and coordination.  MSK:  Non tender with full range of motion and good stability and symmetric strength and tone of elbows, wrist, hip, knee and ankles bilaterally.  Shoulder: left Inspection reveals atrophy of the posterior aspect of the shoulder. Palpation is normal with no tenderness over AC joint or bicipital groove. ROM is full in all planes passively. Rotator cuff strength 3 out of 5 compared to contralateral side signs of impingement with positive Neer and Hawkin's tests, but negative empty can sign. Speeds and Yergason's tests normal. Positive O'Brien's and cross over Normal scapular function observed. Positive painful arc and drop arm sign No apprehension sign Contralateral shoulder unremarkable  MSK US performed of: left This study was ordered, performed, and interpreted by Terrilee Files D.O.  Shoulder:   Supraspinatus: Large tear with significant atrophy.. Infraspinatus:  Appears normal on long and transverse views. Significant increase in Doppler flow Subscapularis:  Appears normal on long and transverse views. Positive bursa Teres Minor:  Appears normal on long and transverse views. AC joint:  Mild arthritic changes Glenohumeral Joint:  Mild arthritic changes Glenoid Labrum:  Patient does have some calcific changes Biceps Tendon:  Complete tear of the tendon noted  Impression: Rotator cuff tear  Procedure: Real-time Ultrasound Guided Injection of left glenohumeral joint Device: GE Logiq E  Ultrasound guided injection is preferred based studies that show increased duration, increased effect, greater accuracy, decreased procedural pain, increased response rate with ultrasound guided versus blind injection.  Verbal informed consent obtained.  Time-out conducted.  Noted no overlying erythema,  induration, or other signs of local infection.  Skin prepped in a sterile fashion.  Local anesthesia: Topical Ethyl chloride.  With sterile technique and under real time ultrasound guidance:  Joint visualized.  23g 1  inch needle inserted posterior approach. Pictures taken for needle placement. Patient did have injection of 2 cc of 1% lidocaine, 2 cc of 0.5% Marcaine, and 1.0 cc of Kenalog 40 mg/dL. Completed without difficulty  Pain immediately resolved suggesting accurate placement of the medication.  Advised to call if fevers/chills, erythema, induration, drainage, or persistent bleeding.  Images permanently stored and available for review in the ultrasound unit.  Impression: Technically successful ultrasound guided injection.  Procedure note 97110; 15 additional minutes spent for Therapeutic exercises as stated in above notes.  This included exercises focusing on stretching, strengthening, with significant focus on eccentric aspects.   Long term goals include an improvement in range of motion, strength, endurance as well as avoiding reinjury. Patient's frequency would include in 1-2 times a day, 3-5 times a week for a duration of 6-12 weeks.  Shoulder Exercises that included:  Basic scapular stabilization to include adduction and depression  of scapula Scaption, focusing on proper movement and good control Internal and External rotation utilizing a theraband, with elbow tucked at side entire time Rows with theraband which was given today  Proper technique shown and discussed handout in great detail with ATC.  All questions were discussed and answered.     Impression and Recommendations:     This case required medical decision making of moderate complexity.      Note: This dictation was prepared with Dragon dictation along with smaller phrase technology. Any transcriptional errors that result from this process are unintentional.

## 2017-09-03 ENCOUNTER — Ambulatory Visit: Payer: Medicare Other | Admitting: Family Medicine

## 2017-09-03 ENCOUNTER — Encounter: Payer: Self-pay | Admitting: Family Medicine

## 2017-09-03 ENCOUNTER — Ambulatory Visit: Payer: Self-pay

## 2017-09-03 VITALS — BP 116/80 | HR 57 | Ht 72.0 in | Wt 250.0 lb

## 2017-09-03 DIAGNOSIS — M12812 Other specific arthropathies, not elsewhere classified, left shoulder: Secondary | ICD-10-CM | POA: Diagnosis not present

## 2017-09-03 DIAGNOSIS — M79602 Pain in left arm: Secondary | ICD-10-CM | POA: Diagnosis not present

## 2017-09-03 NOTE — Patient Instructions (Signed)
Good to see you  Ice 20 minutes 2 times daily. Usually after activity and before bed. pennsaid pinkie amount topically 2 times daily as needed.  Keep hands within peripheral vision Exercises 3 times a week.  You should do well but I do want to see you again in 4 weeks

## 2017-09-03 NOTE — Assessment & Plan Note (Signed)
Patient given an injection. Tolerated the procedure well. We discussed icing regimen and home exercises. We discussed which activities to do in which ones to avoid. Patient given topical anti-inflammatories and work with Event organiserathletic trainer to learn home exercises in greater detail.

## 2017-09-18 ENCOUNTER — Other Ambulatory Visit: Payer: Self-pay | Admitting: Internal Medicine

## 2017-09-25 ENCOUNTER — Other Ambulatory Visit: Payer: Self-pay

## 2017-09-25 MED ORDER — APIXABAN 5 MG PO TABS: ORAL_TABLET | ORAL | 0 refills | Status: DC

## 2017-10-01 NOTE — Progress Notes (Signed)
Tawana ScaleZach Smith D.O. [facility] [address] [place], [place] [address] Phone: [phone] Subjective:    I'm seeing this patient by the request  of:    CC: Left shoulder pain  ZHY:QMVHQIONGEHPI:Subjective  Jeremy R [name] is a 66 y.o. male coming in with complaint of left shoulder pain.  Found to have a likely rotator cuff tear.  Difficult to assess on the ultrasound secondary to the swelling.  Patient was given an injection August 14, 2017.  Given home exercises.  Patient states left arm has been feeling better.  Right arm seems to be worsening.  States that it seems to be anterior.  Patient had been started to increase activity slowly over the course of the last several days.  Seem to be unfortunately worsening.  Does not remember any true injury at this point.  Did have an injury many years ago though.     Past Medical History:  Diagnosis Date  . Back ache   . Hypotension   . Kidney stones   . Paroxysmal atrial fibrillation (HCC)    chads2vasc score is at least 1  . Syncope    due to hypotensin/ complicated by RVR with afib   Past Surgical History:  Procedure Laterality Date  . CARDIOVERSION N/A 12/30/2014   Procedure: CARDIOVERSION;  Surgeon: Chrystie NoseKenneth C Hilty, MD;  [facility];  Service: Cardiovascular;  Laterality: N/A;  . LITHOTRIPSY    . TEE WITHOUT CARDIOVERSION N/A 12/30/2014   Procedure: TRANSESOPHAGEAL ECHOCARDIOGRAM (TEE);  Surgeon: Chrystie NoseKenneth C Hilty, MD;  [facility];  Service: Cardiovascular;  Laterality: N/A;   Social History   Socioeconomic History  . Marital status: Legally Separated    Spouse name: None  . Number of children: 2  . Years of education: 3515  . Highest education level: None  Social Needs  . Financial resource strain: None  . Food insecurity - worry: None  . Food insecurity - inability: None  . Transportation needs - medical: None  . Transportation needs - non-medical: None  Occupational History  . None  Tobacco Use  .  Smoking status: Current Every Day Smoker    Packs/day: 1.00    Types: Cigarettes  . Smokeless tobacco: Former NeurosurgeonUser  . Tobacco comment: approx 1ppd - maybe a little less (04/18/15)  Substance and Sexual Activity  . Alcohol use: No    Alcohol/week: 0.0 oz  . Drug use: No  . Sexual activity: None  Other Topics Concern  . None  Social History Narrative  . None   Allergies  Allergen Reactions  . Sertraline Hcl     Extreme headaches  . Trazodone And Nefazodone Other (See Comments)    Irregular heart beat? Hypotension?   Family History  Problem Relation Age of Onset  . Heart failure Mother        mitral valve disease  . Diabetes Father   . Heart failure Father        MI  . Hypertension Father   . CAD Father   . Alcoholism Father   . Cancer Sister      Past medical history, social, surgical and family history all reviewed in electronic medical record.  No pertanent information unless stated regarding to the chief complaint.   Review of Systems:Review of systems updated and as accurate as of 10/02/17  No headache, visual changes, nausea, vomiting, diarrhea, constipation, dizziness, abdominal pain, skin rash, fevers, chills, night sweats, weight loss, swollen lymph nodes, body aches, joint swelling,  muscle aches, chest pain, shortness of breath, mood changes.   Objective  Blood pressure 104/62, pulse 77, height 6' (1.829 m), weight 257 lb (116.6 kg), SpO2 95 %. Systems examined below as of 10/02/17   General: No apparent distress alert and oriented x3 mood and affect normal, dressed appropriately.  HEENT: Pupils equal, extraocular movements intact  Respiratory: Patient's speak in full sentences and does not appear short of breath  Cardiovascular: No lower extremity edema, non tender, no erythema  Skin: Warm dry intact with no signs of infection or rash on extremities or on axial skeleton.  Abdomen: Soft nontender  Neuro: Cranial nerves II through XII are intact,  neurovascularly intact in all extremities with 2+ DTRs and 2+ pulses.  Lymph: No lymphadenopathy of posterior or anterior cervical chain or axillae bilaterally.  Gait normal with good balance and coordination.  MSK:  Non tender with full range of motion and good stability and symmetric strength and tone of  elbows, wrist, hip, knee and ankles bilaterally.  Left shoulder still shows 4 out of 5 strength.  Positive impingement.  Mild limitation in range of motion with mild crepitus.  Full strength of the grip of the hand Right shoulder shows near full range of motion.  Positive impingement on noted.  Positive speeds test.  Mild weakness of the bicep compared to the contralateral side.    Impression and Recommendations:     This case required medical decision making of moderate complexity.      Note: This dictation was prepared with Dragon dictation along with smaller phrase technology. Any transcriptional errors that result from this process are unintentional.

## 2017-10-02 ENCOUNTER — Encounter: Payer: Self-pay | Admitting: Family Medicine

## 2017-10-02 ENCOUNTER — Ambulatory Visit: Payer: Medicare Other | Admitting: Family Medicine

## 2017-10-02 DIAGNOSIS — M12812 Other specific arthropathies, not elsewhere classified, left shoulder: Secondary | ICD-10-CM

## 2017-10-02 DIAGNOSIS — S43003A Unspecified subluxation of unspecified shoulder joint, initial encounter: Secondary | ICD-10-CM | POA: Insufficient documentation

## 2017-10-02 DIAGNOSIS — S46119A Strain of muscle, fascia and tendon of long head of biceps, unspecified arm, initial encounter: Secondary | ICD-10-CM | POA: Diagnosis not present

## 2017-10-02 NOTE — Assessment & Plan Note (Signed)
Stable.  Discussed possible impingement.  We discussed icing regimen.  Patient was to increase activity slowly over the course of next several days. The patient does well we will continue to monitor.  Worsening symptom consider injection again.  Patient wants no surgical intervention.

## 2017-10-02 NOTE — Assessment & Plan Note (Signed)
Bicep subluxation noted today.  Home exercise, compression, which activities to do which wants to avoid.  Follow-up again in 4-6 weeks

## 2017-10-02 NOTE — Patient Instructions (Signed)
Good to see you  Jeremy King is your friend.  Bicep compression on the right  Exercises 3 times a week.  See me again in 6 weeks

## 2017-10-17 ENCOUNTER — Encounter: Payer: Self-pay | Admitting: Internal Medicine

## 2017-10-17 ENCOUNTER — Other Ambulatory Visit (INDEPENDENT_AMBULATORY_CARE_PROVIDER_SITE_OTHER): Payer: Medicare Other

## 2017-10-17 ENCOUNTER — Ambulatory Visit (INDEPENDENT_AMBULATORY_CARE_PROVIDER_SITE_OTHER): Payer: Medicare Other | Admitting: Internal Medicine

## 2017-10-17 VITALS — BP 100/60 | HR 50 | Temp 97.7°F | Ht 72.0 in | Wt 243.0 lb

## 2017-10-17 DIAGNOSIS — F411 Generalized anxiety disorder: Secondary | ICD-10-CM

## 2017-10-17 DIAGNOSIS — I480 Paroxysmal atrial fibrillation: Secondary | ICD-10-CM

## 2017-10-17 DIAGNOSIS — E785 Hyperlipidemia, unspecified: Secondary | ICD-10-CM | POA: Diagnosis not present

## 2017-10-17 LAB — COMPREHENSIVE METABOLIC PANEL
ALT: 25 U/L (ref 0–53)
AST: 14 U/L (ref 0–37)
Albumin: 4.3 g/dL (ref 3.5–5.2)
Alkaline Phosphatase: 49 U/L (ref 39–117)
BILIRUBIN TOTAL: 0.8 mg/dL (ref 0.2–1.2)
BUN: 17 mg/dL (ref 6–23)
CALCIUM: 9.3 mg/dL (ref 8.4–10.5)
CO2: 29 mEq/L (ref 19–32)
Chloride: 107 mEq/L (ref 96–112)
Creatinine, Ser: 1.11 mg/dL (ref 0.40–1.50)
GFR: 69.94 mL/min (ref >60.00)
GLUCOSE: 79 mg/dL (ref 70–99)
POTASSIUM: 4 meq/L (ref 3.5–5.1)
SODIUM: 140 meq/L (ref 135–145)
Total Protein: 6.8 g/dL (ref 6.0–8.3)

## 2017-10-17 LAB — HEMOGLOBIN A1C: Hgb A1c MFr Bld: 5.5 % (ref 4.6–6.5)

## 2017-10-17 LAB — LIPID PANEL
CHOLESTEROL: 188 mg/dL (ref 0–200)
HDL: 37 mg/dL — AB (ref >39.00)
NonHDL: 151.09
Total CHOL/HDL Ratio: 5
Triglycerides: 239 mg/dL — ABNORMAL HIGH (ref 0.0–149.0)
VLDL: 47.8 mg/dL — AB (ref 0.0–40.0)

## 2017-10-17 LAB — CBC
HCT: 44.4 % (ref 39.0–52.0)
Hemoglobin: 15.2 g/dL (ref 13.0–17.0)
MCHC: 34.3 g/dL (ref 30.0–36.0)
MCV: 99.1 fl (ref 78.0–100.0)
Platelets: 210 10*3/uL (ref 150.0–400.0)
RBC: 4.49 Mil/uL (ref 4.22–5.81)
RDW: 13.2 % (ref 11.5–15.5)
WBC: 8.7 10*3/uL (ref 4.0–10.5)

## 2017-10-17 LAB — LDL CHOLESTEROL, DIRECT: LDL DIRECT: 143 mg/dL

## 2017-10-17 NOTE — Assessment & Plan Note (Signed)
Checking CBC and CMP for being on multaq and eliquis.

## 2017-10-17 NOTE — Patient Instructions (Signed)

## 2017-10-17 NOTE — Assessment & Plan Note (Signed)
Refill effexor and clonazepam as needed. Reminded about the increase in risk of falls and memory change with long term clonazepam usage and he elects to continue given the increase in his QOL.

## 2017-10-17 NOTE — Assessment & Plan Note (Signed)
Checking lipid panel, goal LDL<100. Taking fish oil currently.

## 2017-10-17 NOTE — Progress Notes (Signed)
   Subjective:    Patient ID: Jeremy King, male    DOB: Feb 04, 1951, 67 y.o.   MRN: [id number]  HPI The patient is a 67 YO man coming in for refills on his medications. He does have PTSD from several life events. He takes effexor and uses clonazepam BID prn. He denies flare recently. He is not currently in counseling. Denies falls or balance problems. No side effects from the medication. He also needs follow up on his medical conditions including his hyperlipidemia (needs lipid panel, taking fish oil currently, denies chest pains or stroke symptoms), and his A fib (taking multaq and eliquis, no labs in some time, denies bleeding or blood in stool, denies palpitations recently).   Review of Systems  Constitutional: Negative.   HENT: Negative.   Eyes: Negative.   Respiratory: Negative for cough, chest tightness and shortness of breath.   Cardiovascular: Negative for chest pain, palpitations and leg swelling.  Gastrointestinal: Negative for abdominal distention, abdominal pain, constipation, diarrhea, nausea and vomiting.  Musculoskeletal: Negative.   Skin: Negative.   Neurological: Negative.   Psychiatric/Behavioral: Negative.       Objective:   Physical Exam  Constitutional: He is oriented to person, place, and time. He appears well-developed and well-nourished.  HENT:  Head: Normocephalic and atraumatic.  Eyes: EOM are normal.  Neck: Normal range of motion.  Cardiovascular: Normal rate and regular rhythm.  Pulmonary/Chest: Effort normal and breath sounds normal. No respiratory distress. He has no wheezes. He has no rales.  Abdominal: Soft. Bowel sounds are normal. He exhibits no distension. There is no tenderness. There is no rebound.  Musculoskeletal: He exhibits no edema.  Neurological: He is alert and oriented to person, place, and time. Coordination normal.  Skin: Skin is warm and dry.  Psychiatric: He has a normal mood and affect.   Vitals:   10/17/17 1339  BP: 100/60    Pulse: (!) 50  Temp: 97.7 F (36.5 C)  TempSrc: Oral  SpO2: 98%  Weight: 243 lb (110.2 kg)  Height: 6' (1.829 m)      Assessment & Plan:

## 2017-10-23 ENCOUNTER — Other Ambulatory Visit: Payer: Self-pay | Admitting: Internal Medicine

## 2017-11-12 NOTE — Progress Notes (Signed)
[name] [address] [place], [place] [address] Phone: [phone] Subjective:    I'm seeing this patient by the request  of:    CC: Shoulder pain follow-up  BJY:NWGNFAOZHY  Jeremy King is a 67 y.o. male coming in bilateral shoulder pain. His right is doing better. His left shoulder is bothering him. His pain is intermittent.  Patient states is worsening recently.  Patient describes it as a dull, throbbing aching pain.  Denies any numbness tingling.  States that it seems to have some weakness.      Past Medical History:  Diagnosis Date  . Back ache   . Hypotension   . Kidney stones   . Paroxysmal atrial fibrillation (HCC)    chads2vasc score is at least 1  . Syncope    due to hypotensin/ complicated by RVR with afib   Past Surgical History:  Procedure Laterality Date  . CARDIOVERSION N/A 12/30/2014   Procedure: CARDIOVERSION;  Surgeon: Chrystie Nose, MD;  Location: [facility] ENDOSCOPY;  Service: Cardiovascular;  Laterality: N/A;  . LITHOTRIPSY    . TEE WITHOUT CARDIOVERSION N/A 12/30/2014   Procedure: TRANSESOPHAGEAL ECHOCARDIOGRAM (TEE);  Surgeon: Chrystie Nose, MD;  Location: [facility] ENDOSCOPY;  Service: Cardiovascular;  Laterality: N/A;   Social History   Socioeconomic History  . Marital status: Legally Separated    Spouse name: Not on file  . Number of children: 2  . Years of education: 64  . Highest education level: Not on file  Social Needs  . Financial resource strain: Not on file  . Food insecurity - worry: Not on file  . Food insecurity - inability: Not on file  . Transportation needs - medical: Not on file  . Transportation needs - non-medical: Not on file  Occupational History  . Not on file  Tobacco Use  . Smoking status: Current Every Day Smoker    Packs/day: 1.00    Types: Cigarettes  . Smokeless tobacco: Former Neurosurgeon  . Tobacco comment: approx 1ppd - maybe a little less (04/18/15)  Substance and Sexual Activity  .  Alcohol use: No    Alcohol/week: 0.0 oz  . Drug use: No  . Sexual activity: Not on file  Other Topics Concern  . Not on file  Social History Narrative  . Not on file   Allergies  Allergen Reactions  . Sertraline Hcl     Extreme headaches  . Trazodone And Nefazodone Other (See Comments)    Irregular heart beat? Hypotension?   Family History  Problem Relation Age of Onset  . Heart failure Mother        mitral valve disease  . Diabetes Father   . Heart failure Father        MI  . Hypertension Father   . CAD Father   . Alcoholism Father   . Cancer Sister      Past medical history, social, surgical and family history all reviewed in electronic medical record.  No pertanent information unless stated regarding to the chief complaint.   Review of Systems:Review of systems updated and as accurate as of 11/12/17  No headache, visual changes, nausea, vomiting, diarrhea, constipation, dizziness, abdominal pain, skin rash, fevers, chills, night sweats, weight loss, swollen lymph nodes, body aches, joint swelling, muscle aches, chest pain, shortness of breath, mood changes.   Objective  There were no vitals taken for this visit. Systems examined below as of 11/12/17   General: No apparent distress alert  and oriented x3 mood and affect normal, dressed appropriately.  HEENT: Pupils equal, extraocular movements intact  Respiratory: Patient's speak in full sentences and does not appear short of breath  Cardiovascular: No lower extremity edema, non tender, no erythema  Skin: Warm dry intact with no signs of infection or rash on extremities or on axial skeleton.  Abdomen: Soft nontender  Neuro: Cranial nerves II through XII are intact, neurovascularly intact in all extremities with 2+ DTRs and 2+ pulses.  Lymph: No lymphadenopathy of posterior or anterior cervical chain or axillae bilaterally.  Gait normal with good balance and coordination.  MSK:  Non tender with full range of motion and  good stability and symmetric strength and tone of  elbows, wrist, hip, knee and ankles bilaterally.  Left shoulder exam still have some weakness of 4 out of 5 strength.  Positive impingement.  Mild decrease in range of motion and internal rotation.  Nontender on palpation.  Procedure: Real-time Ultrasound Guided Injection of left glenohumeral joint Device: GE Logiq E  Ultrasound guided injection is preferred based studies that show increased duration, increased effect, greater accuracy, decreased procedural pain, increased response rate with ultrasound guided versus blind injection.  Verbal informed consent obtained.  Time-out conducted.  Noted no overlying erythema, induration, or other signs of local infection.  Skin prepped in a sterile fashion.  Local anesthesia: Topical Ethyl chloride.  With sterile technique and under real time ultrasound guidance:  Joint visualized.  21g 2 inch needle inserted posterior approach. Pictures taken for needle placement. Patient did have injection of 2 cc of 0.5% Marcaine, and 1cc of Kenalog 40 mg/dL. Completed without difficulty  Pain immediately resolved suggesting accurate placement of the medication.  Advised to call if fevers/chills, erythema, induration, drainage, or persistent bleeding.  Images permanently stored and available for review in the ultrasound unit.  Impression: Technically successful ultrasound guided injection.    Impression and Recommendations:     This case required medical decision making of moderate complexity.      Note: This dictation was prepared with Dragon dictation along with smaller phrase technology. Any transcriptional errors that result from this process are unintentional.

## 2017-11-13 ENCOUNTER — Ambulatory Visit: Payer: Medicare Other | Admitting: Family Medicine

## 2017-11-13 ENCOUNTER — Encounter: Payer: Self-pay | Admitting: Family Medicine

## 2017-11-13 ENCOUNTER — Ambulatory Visit: Payer: Self-pay

## 2017-11-13 VITALS — BP 108/72 | HR 58 | Ht 72.0 in | Wt 237.0 lb

## 2017-11-13 DIAGNOSIS — M25512 Pain in left shoulder: Secondary | ICD-10-CM

## 2017-11-13 DIAGNOSIS — G8929 Other chronic pain: Secondary | ICD-10-CM | POA: Diagnosis not present

## 2017-11-13 DIAGNOSIS — M12812 Other specific arthropathies, not elsewhere classified, left shoulder: Secondary | ICD-10-CM | POA: Diagnosis not present

## 2017-11-13 NOTE — Patient Instructions (Signed)
Good to see you  Ice Is your friend.  I am fine to do this every 3 months if needed Stay active Read about PRP  See em again in 4 weeks

## 2017-11-13 NOTE — Assessment & Plan Note (Signed)
Patient given injection.  Tolerated the procedure well.  Discussed icing regimen and home exercises again.  We discussed the possibility of needing advanced imaging.  Patient declined formal physical therapy.  Follow-up with me again 4 weeks

## 2017-11-23 ENCOUNTER — Other Ambulatory Visit: Payer: Self-pay | Admitting: Internal Medicine

## 2017-12-16 ENCOUNTER — Other Ambulatory Visit: Payer: Self-pay

## 2017-12-16 ENCOUNTER — Other Ambulatory Visit: Payer: Self-pay | Admitting: *Deleted

## 2017-12-16 ENCOUNTER — Other Ambulatory Visit: Payer: Self-pay | Admitting: Internal Medicine

## 2017-12-16 MED ORDER — DRONEDARONE HCL 400 MG PO TABS: 400.0000 mg | ORAL_TABLET | Freq: Two times a day (BID) | ORAL | 0 refills | Status: DC

## 2017-12-25 ENCOUNTER — Other Ambulatory Visit: Payer: Self-pay | Admitting: Internal Medicine

## 2017-12-25 NOTE — Telephone Encounter (Signed)
Routing to dr plotnikov, please advise in the absence of dr crawford, thanks 

## 2018-01-27 ENCOUNTER — Ambulatory Visit (INDEPENDENT_AMBULATORY_CARE_PROVIDER_SITE_OTHER): Payer: Medicare Other | Admitting: Internal Medicine

## 2018-01-27 ENCOUNTER — Encounter: Payer: Self-pay | Admitting: Internal Medicine

## 2018-01-27 DIAGNOSIS — R21 Rash and other nonspecific skin eruption: Secondary | ICD-10-CM | POA: Insufficient documentation

## 2018-01-27 MED ORDER — TRIAMCINOLONE ACETONIDE 0.1 % EX CREA: 1.0000 "application " | TOPICAL_CREAM | Freq: Two times a day (BID) | CUTANEOUS | 0 refills | Status: DC

## 2018-01-27 NOTE — Assessment & Plan Note (Signed)
Rx for triamcinolone cream for use on earlobe. If no resolution recommend dermatology evaluation. Cyst on the base of the skull needs no intervention currently.

## 2018-01-27 NOTE — Progress Notes (Signed)
   Subjective:    Patient ID: Jeremy King, male    DOB: April 24, 1951, 67 y.o.   MRN: [id number]  HPI The patient is a 67 YO man coming in for 2 skin lesions concerned about them. 1 on his ear started about 2 years or so ago. Somewhat tender on the tip of his earlobe. Some redness on the skin but no lesion which is dark. Had many sunburns earlier in life. Second spot is on the base of the skull and not tender to touch. Present for many years and unchanged over that time. Denies change in weight. Now he is good about using sunscreen. Denies other new lesions.   Review of Systems  Constitutional: Negative.   Respiratory: Negative for cough, chest tightness and shortness of breath.   Cardiovascular: Negative for chest pain, palpitations and leg swelling.  Gastrointestinal: Negative for abdominal distention, abdominal pain, constipation, diarrhea, nausea and vomiting.  Musculoskeletal: Negative.   Skin: Positive for color change.  Neurological: Negative.       Objective:   Physical Exam  Constitutional: He is oriented to person, place, and time. He appears well-developed and well-nourished.  HENT:  Head: Normocephalic and atraumatic.  Eyes: EOM are normal.  Neck: Normal range of motion.  Cardiovascular: Normal rate and regular rhythm.  Pulmonary/Chest: Effort normal and breath sounds normal. No respiratory distress. He has no wheezes. He has no rales.  Abdominal: Soft.  Musculoskeletal: He exhibits no edema.  Neurological: He is alert and oriented to person, place, and time. Coordination normal.  Skin: Skin is warm and dry.  Redness on the superior aspect of the left earlobe without discrete lesion or macular lesion. Some mild tenderness to palpation, cyst on the base of the skull which is non-tender and about 3-4 mm oblong mobile.   Psychiatric: He has a normal mood and affect.   Vitals:   01/27/18 0840  BP: 110/68  Pulse: (!) 51  Temp: 98.3 F (36.8 C)  TempSrc: Oral  SpO2: 97%    Weight: 228 lb (103.4 kg)  Height: 6' (1.829 m)      Assessment & Plan:

## 2018-01-27 NOTE — Patient Instructions (Signed)
We have sent in the ointment to use twice a day for 1-2 weeks. Let us know if this does not improve.

## 2018-01-29 ENCOUNTER — Other Ambulatory Visit: Payer: Self-pay

## 2018-01-29 NOTE — Telephone Encounter (Signed)
Control database checked last refill: 12/29/2017 LOV: 10/17/2017 for anxiety

## 2018-01-30 MED ORDER — CLONAZEPAM 1 MG PO TABS: 1.0000 mg | ORAL_TABLET | Freq: Two times a day (BID) | ORAL | 3 refills | Status: DC | PRN

## 2018-01-30 NOTE — Telephone Encounter (Signed)
Sent in clonazepam please remind patient that our refill policy is that we have 1-2 business days for requests so he should call earlier next time.

## 2018-01-30 NOTE — Telephone Encounter (Signed)
What is request?

## 2018-01-30 NOTE — Telephone Encounter (Signed)
Pt called in to check the status of his clonazePAM (KLONOPIN) 1 MG tablet, pt says that he will be running out over the weekend.   Made pt aware that the office is working on request.

## 2018-02-01 ENCOUNTER — Other Ambulatory Visit: Payer: Self-pay | Admitting: Internal Medicine

## 2018-02-03 ENCOUNTER — Other Ambulatory Visit: Payer: Self-pay | Admitting: Internal Medicine

## 2018-02-03 MED ORDER — APIXABAN 5 MG PO TABS: 5.0000 mg | ORAL_TABLET | Freq: Two times a day (BID) | ORAL | 1 refills | Status: DC

## 2018-02-03 NOTE — Telephone Encounter (Signed)
°*  STAT* If patient is at the pharmacy, call can be transferred to refill team.   1. Which medications need to be refilled? (please list name of each medication and dose if known) Eliquis-pt has an appt this Thursday(02-05-18)  2. Which pharmacy/location (including street and city if local pharmacy) is medication to be sent to?[phone]  3. Do they need a 30 day or 90 day supply? 60

## 2018-02-05 ENCOUNTER — Encounter: Payer: Self-pay | Admitting: Internal Medicine

## 2018-02-05 ENCOUNTER — Ambulatory Visit: Payer: Medicare Other | Admitting: Internal Medicine

## 2018-02-05 VITALS — BP 116/68 | HR 82 | Ht 72.0 in | Wt 224.6 lb

## 2018-02-05 DIAGNOSIS — I4891 Unspecified atrial fibrillation: Secondary | ICD-10-CM

## 2018-02-05 DIAGNOSIS — Z01818 Encounter for other preprocedural examination: Secondary | ICD-10-CM | POA: Diagnosis not present

## 2018-02-05 NOTE — Patient Instructions (Addendum)
Dear Mr. Kitner,   You are scheduled for a Cardioversion on May 13 th with Dr. Rennis Golden.  Please arrive at the [facility] (Main Entrance A) at [facility]: [address] [place], [place] [address] at 9 am. (1 hour prior to procedure unless lab work is needed; if lab work is needed arrive 1.5 hours ahead)  DIET: Nothing to eat or drink after midnight except a sip of water with medications (see medication instructions below)  Medication Instructions:  Continue your anticoagulant: Eliquis You will need to continue your anticoagulant after your procedure until you  are told by your  Provider that it is safe to stop   Labs: Your provider would like for you to return on May 7th for pre procedure labs to have the following labs drawn: BMET and CBC. You do not need an appointment for the lab. Once in our office lobby there is a podium where you can sign in and ring the doorbell to alert [place] that you are here. The lab is open from 8:00 am to 4:30 pm; closed for lunch from 12:45pm-1:45pm.   You must have a responsible person to drive you home and stay in the waiting area during your procedure. Failure to do so could result in cancellation.  Bring your insurance cards.  *Special Note: Every effort is made to have your procedure done on time. Occasionally there are emergencies that occur at the hospital that may cause delays. Please be patient if a delay does occur.    Thank you for choosing Heartcare at [facility]!!

## 2018-02-05 NOTE — Progress Notes (Signed)
OFFICE NOTE  Chief Complaint:  Facial bruising  Primary Care Physician: [name], MD  HPI:  Jeremy King is a pleasant 67 year old male with few medical problems. He has a history of anxiety as well as dyslipidemia. Unfortunately he's had a number of syncopal episodes and has known orthostatic hypotension. He tells me that couple years ago he was seen at the [facility] and syncopized. Blood pressure was noted to be 60 over palpation. He was hospitalized for this and treated but is had recurrent hypotension. He was previously on Flomax which was discontinued. He does have a history of kidney stones with what sounds like ureteral stenting and/or lithotripsy. He probably saw a cardiologist once at the [place] and he tells me that they did not recommend any different medications. His CHADSVASC score is 1 given his age of 38 and hypertension. He has been maintained on aspirin. Recently he was seen in [facility] after a syncopal episode. He underwent cardiac workup including echocardiogram which shows normal LV function. He was noted to be in A. fib at that time. He says that it comes and goes but is not clear whether it has become more persistent. He's never had a cardioversion before. He denies significant alcohol use but has been a long-term smoker. There also is a strong family history of coronary disease in both parents. Recently he's been having problems with his knee and is contemplating left knee surgery with Dr. Ciro Backer.  It should be noted in the office today that he became dizzy after sitting up for his EKG. Orthostatic blood pressures were taken and he had a 20 point drop in systolic blood pressure with change in position.  I saw Mr. Radin back in the office today. Unfortunately his home monitor has been alarming as he's had frequent atrial fibrillation with rapid ventricular response. Heart rates have been up into the 180s. He also had a near-syncopal episode. He  unfortunately did not check his blood pressure at that time. He's been pretty much in persistent A. fib since we placed the monitor. I think this is at least contributing to his symptoms. 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 Exam is not consistent with heart failure. Blood pressure is slightly improved today at 100 systolic, however it is limited any AV nodal blocking medications. He's currently only on aspirin because of a low CHADSVASC score.  Mr. Hillis returns today for follow-up. He successfully cardioverted and is maintaining sinus bradycardia. He's worn a Holter monitor which demonstrates several episodes of significant bradycardia into the 30s. He's had some sinus pauses of 2-1/2-3 seconds, which appear mostly at night. Laboratory work is unremarkable and digoxin level is 1.7. The digoxin, again was started for heart rate control prior to cardioversion as his blood pressure was less than 90 systolic with significant orthostasis. I also discontinued his trazodone which seems to have significantly improved his orthostasis. Blood pressure remains low today at 98/50. He underwent a nuclear stress test which showed 2 small fixed defects which were thought to be either small areas of scar or artifact. His echo showed normal wall motion and preserved systolic function. He is not describing anginal symptoms.  Mr. Hallstrom returns today for follow-up. He has since seen Dr. Johney Frame who put him on multiecho for control of his atrial fibrillation. Overall he feels that he may be doing well with this although yesterday he thought his heart rate was irregular. From what he described it sounds like  it was regularly irregular, this may not be atrial fibrillation. He seems to be tolerating this and the Eliquis without any bleeding problems. He is come off of trazodone due to problems with orthostatic hypotension and syncope. He's not had any further episodes. He is having problems sleeping at night  though. He was on melatonin which was initially helpful but however since it has not been helpful. In the past we talked about the possibility of obstructive sleep apnea or other sleep related disorder. He was hesitant to get a sleep study but now seems to be interested in that.  09/19/2016  Mr. Carandang returns for follow-up. Overall he is doing well without recurrent atrial fibrillation. He is maintained on multiecho. She is EKG shows sinus rhythm with a PAC an incomplete right bundle branch block. QTC is 437 ms. He is on Eliquis and denies any significant bleeding problems. He uses melatonin for sleep at night and takes 15 mg. Is generally helps him get 5-6 hours of sleep. He denies any chest pain or worsening shortness of breath.  02/05/2018  Mr. Hearn was seen today in follow-up.  He says he is feeling well but unfortunately had a recent fall.  He said he was building a ramp for his kayak and somehow tripped and landed on the edge of the ramp.  He has a large area of ecchymosis over the right cheek which extends down into the right anterior neck and some suborbital ecchymosis.  He denies any pain or tenderness concerning for fracture.  He had no mental status changes or loss of consciousness.  He had no significant external bleeding.  He continues to take his Eliquis.  He says is been compliant with it and has not missed doses.  Of note, he is back in atrial fibrillation today.  He was very surprised to hear this since he is feeling well.  In the past he has been very symptomatic and has felt dizzy and passed out.  It may be because his rate is better controlled on [place] around.  This is the first episode of A. fib in the past several years.  PMHx:  Past Medical History:  Diagnosis Date  . Back ache   . Hypotension   . Kidney stones   . Paroxysmal atrial fibrillation (HCC)    chads2vasc score is at least 1  . Syncope    due to hypotensin/ complicated by RVR with afib    Past Surgical  History:  Procedure Laterality Date  . CARDIOVERSION N/A 12/30/2014   Procedure: CARDIOVERSION;  Surgeon: Chrystie Nose, MD;  Location: [facility] ENDOSCOPY;  Service: Cardiovascular;  Laterality: N/A;  . LITHOTRIPSY    . TEE WITHOUT CARDIOVERSION N/A 12/30/2014   Procedure: TRANSESOPHAGEAL ECHOCARDIOGRAM (TEE);  Surgeon: Chrystie Nose, MD;  Location[facility] ENDOSCOPY;  Service: Cardiovascular;  Laterality: N/A;    FAMHx:  Family History  Problem Relation Age of Onset  . Heart failure Mother        mitral valve disease  . Diabetes Father   . Heart failure Father        MI  . Hypertension Father   . CAD Father   . Alcoholism Father   . Cancer Sister     SOCHx:   reports that he has been smoking cigarettes.  He has been smoking about 1.00 pack per day. He has quit using smokeless tobacco. He reports that he does not drink alcohol or use drugs.  ALLERGIES:  Allergies  Allergen Reactions  .  Sertraline Hcl     Extreme headaches  . Trazodone And Nefazodone Other (See Comments)    Irregular heart beat? Hypotension?    ROS: Pertinent items noted in HPI and remainder of comprehensive ROS otherwise negative.  HOME MEDS: Current Outpatient Medications  Medication Sig Dispense Refill  . apixaban (ELIQUIS) 5 MG TABS tablet Take 1 tablet (5 mg total) by mouth 2 (two) times daily. Schedule MD appt for further refills 180 tablet 1  . Ascorbic Acid (VITAMIN C) 1000 MG tablet Take 1,000 mg by mouth daily.    . clonazePAM (KLONOPIN) 1 MG tablet Take 1 tablet (1 mg total) by mouth 2 (two) times daily as needed. for anxiety 60 tablet 3  . dronedarone (MULTAQ) 400 MG tablet Take 1 tablet (400 mg total) by mouth 2 (two) times daily with a meal. NEED OV. 180 tablet 0  . Garlic 1000 MG CAPS Take 1 capsule by mouth daily.    [name] ibuprofen (ADVIL,MOTRIN) 200 MG tablet Take 800 mg by mouth every 6 (six) hours as needed for moderate pain (pain).    . Melatonin 1 MG TABS Take 1 tablet by mouth daily.    .  multivitamin (ONE-A-DAY MEN'S) TABS tablet Take 1 tablet by mouth daily.    . Omega-3 Fatty Acids (FISH OIL) 1000 MG CAPS Take 1 capsule by mouth 2 (two) times daily.     [name] triamcinolone cream (KENALOG) 0.1 % Apply 1 application topically 2 (two) times daily. 30 g 0  . venlafaxine XR (EFFEXOR-XR) 150 MG 24 hr capsule TAKE 1 CAPSULE(150 MG) BY MOUTH DAILY WITH BREAKFAST 90 capsule 2   No current facility-administered medications for this visit.     LABS/IMAGING: No results found for this or any previous visit (from the past 48 hour(s)). No results found.  VITALS: BP 116/68   Pulse 82   Ht 6' (1.829 m)   Wt 224 lb 9.6 oz (101.9 kg)   BMI 30.46 kg/m   EXAM: General appearance: alert, fatigued and no distress Neck: no carotid bruit, no JVD and thyroid not enlarged, symmetric, no tenderness/mass/nodules Lungs: clear to auscultation bilaterally Heart: regular rate and rhythm, S1, S2 normal, no murmur, click, rub or gallop Abdomen: soft, non-tender; bowel sounds normal; no masses,  no organomegaly Extremities: extremities normal, atraumatic, no cyanosis or edema Pulses: 2+ and symmetric Skin: Skin color, texture, turgor normal. No rashes or lesions Psych: Anxious  EKG: A. fib at 82, RBBB-personally reviewed  ASSESSMENT: 1. Recurrent atrial fibrillation -  on Multaq with prior cardioversion (CHADSVASC 2) 2. RBBB 3. Normal LV function by recent echocardiogram 4. Remote syncopal episodes 5. History of bradycardia with pauses 6. Fatigue, non-restorative sleep  PLAN: 1.   Mr. Reap is back in A. fib with RBBB, which is now complete.  His ventricular rate is well controlled.  He seems to be asymptomatic with his A. fib however this is his first recurrence since being on Multaq.  It may be related to his recent fall and trauma.  I think is worthwhile to consider cardioversion since he is maintained sinus rhythm for such a long time.  That being said, since he is minimally symptomatic,  if it all, should the cardioversion be unsuccessful or if he were to revert back to A. fib after successful cardioversion, I would not be very aggressive about trying to maintain sinus rhythm.  He will need lifelong anticoagulation.  We will try to arrange for elective cardioversion in the next few weeks.  Chrystie Nose, MD, [facility], FACP  [place]  [facility]  Medical Director of the [facility]  [facility] Diplomate of the American Board of Clinical Lipidology Attending Cardiologist  Direct Dial: [phone]  Fax: [phone]  Website:  [name] 02/05/2018, 9:21 AM

## 2018-02-10 ENCOUNTER — Telehealth: Payer: Self-pay | Admitting: *Deleted

## 2018-02-10 DIAGNOSIS — Z01818 Encounter for other preprocedural examination: Secondary | ICD-10-CM | POA: Diagnosis not present

## 2018-02-10 DIAGNOSIS — I4891 Unspecified atrial fibrillation: Secondary | ICD-10-CM | POA: Diagnosis not present

## 2018-02-10 NOTE — Telephone Encounter (Signed)
Left a message as a reminder for the patient to get his pre cardioversion labs drawn.

## 2018-02-11 ENCOUNTER — Other Ambulatory Visit: Payer: Self-pay | Admitting: *Deleted

## 2018-02-11 DIAGNOSIS — I4891 Unspecified atrial fibrillation: Secondary | ICD-10-CM

## 2018-02-11 LAB — BASIC METABOLIC PANEL
BUN / CREAT RATIO: 14 (ref 10–24)
BUN: 14 mg/dL (ref 8–27)
CO2: 26 mmol/L (ref 20–29)
Calcium: 9.2 mg/dL (ref 8.6–10.2)
Chloride: 103 mmol/L (ref 96–106)
Creatinine, Ser: 0.97 mg/dL (ref 0.76–1.27)
GFR calc non Af Amer: 80 mL/min/{1.73_m2} (ref >59)
GFR, EST AFRICAN AMERICAN: 92 mL/min/{1.73_m2} (ref >59)
Glucose: 89 mg/dL (ref 65–99)
Potassium: 3.9 mmol/L (ref 3.5–5.2)
SODIUM: 145 mmol/L — AB (ref 134–144)

## 2018-02-11 LAB — CBC
HEMATOCRIT: 43.4 % (ref 37.5–51.0)
HEMOGLOBIN: 15.4 g/dL (ref 13.0–17.7)
MCH: 33.3 pg — AB (ref 26.6–33.0)
MCHC: 35.5 g/dL (ref 31.5–35.7)
MCV: 94 fL (ref 79–97)
Platelets: 253 10*3/uL (ref 150–379)
RBC: 4.63 x10E6/uL (ref 4.14–5.80)
RDW: 13.4 % (ref 12.3–15.4)
WBC: 11 10*3/uL — AB (ref 3.4–10.8)

## 2018-02-14 ENCOUNTER — Ambulatory Visit (INDEPENDENT_AMBULATORY_CARE_PROVIDER_SITE_OTHER): Payer: Medicare Other | Admitting: Family Medicine

## 2018-02-14 VITALS — BP 104/66 | HR 77 | Temp 98.2°F | Resp 16 | Wt 226.0 lb

## 2018-02-14 DIAGNOSIS — J069 Acute upper respiratory infection, unspecified: Secondary | ICD-10-CM

## 2018-02-14 DIAGNOSIS — B9789 Other viral agents as the cause of diseases classified elsewhere: Secondary | ICD-10-CM

## 2018-02-14 MED ORDER — BENZONATATE 100 MG PO CAPS: 100.0000 mg | ORAL_CAPSULE | Freq: Two times a day (BID) | ORAL | 0 refills | Status: DC | PRN

## 2018-02-14 NOTE — Progress Notes (Signed)
Subjective   CC:  Chief Complaint  Patient presents with  . Cough    productive cough, sore throat, no fever    HPI: Jeremy King is a 67 y.o. male who presents to the office today to address the problems listed above in the chief complaint.  Patient complains of typical URI symptoms including nasal congestion, mild sore throat, cough, and mild malaise.  The symptoms have been present for 2-3 days. He denies high fever or productive cough, shortness of breath or significant GI symptoms.  Over-the-counter cold medicines have been minimally or mildly helpful. He has an appt for ablation tx for afib on Monday.  I reviewed the patients updated PMH, FH, and SocHx.    Patient Active Problem List   Diagnosis Date Noted  . Rash 01/27/2018  . Subluxation of tendon of long head of biceps 10/02/2017  . Rotator cuff arthropathy of left shoulder 09/03/2017  . Bradycardia 09/19/2016  . Excessive daytime sleepiness 04/18/2015  . Atrial fibrillation (HCC) 12/29/2014  . Persistent atrial fibrillation (HCC) 11/26/2014  . Syncope and collapse   . Hyperlipidemia 04/21/2007  . Anxiety state 04/21/2007   Current Meds  Medication Sig  . apixaban (ELIQUIS) 5 MG TABS tablet Take 1 tablet (5 mg total) by mouth 2 (two) times daily. Schedule MD appt for further refills  . Ascorbic Acid (VITAMIN C) 1000 MG tablet Take 1,000 mg by mouth daily.  . clonazePAM (KLONOPIN) 1 MG tablet Take 1 tablet (1 mg total) by mouth 2 (two) times daily as needed. for anxiety (Patient taking differently: Take 1 mg by mouth 2 (two) times daily as needed for anxiety. )  . dronedarone (MULTAQ) 400 MG tablet Take 1 tablet (400 mg total) by mouth 2 (two) times daily with a meal. NEED OV.  . Garlic 1000 MG CAPS Take 1,000 mg by mouth daily.   . Hypromellose (ARTIFICIAL TEARS OP) Place 1 drop into both eyes daily as needed (for dry eyes).  [name] ibuprofen (ADVIL,MOTRIN) 200 MG tablet Take 800 mg by mouth every 6 (six) hours as needed  for moderate pain (pain).  . Melatonin 1 MG TABS Take 2 mg by mouth at bedtime.   . multivitamin (ONE-A-DAY MEN'S) TABS tablet Take 1 tablet by mouth daily.  . Omega-3 Fatty Acids (FISH OIL) 1000 MG CAPS Take 1,000 mg by mouth 2 (two) times daily.   [name] triamcinolone cream (KENALOG) 0.1 % Apply 1 application topically 2 (two) times daily.  [name] venlafaxine XR (EFFEXOR-XR) 150 MG 24 hr capsule TAKE 1 CAPSULE(150 MG) BY MOUTH DAILY WITH BREAKFAST    Review of Systems: Constitutional: Negative for fever malaise or anorexia Cardiovascular: negative for chest pain Respiratory: negative for SOB or pleuritic chest pain Gastrointestinal: negative for abdominal pain  Objective  Vitals: BP 104/66   Pulse 77   Temp 98.2 F (36.8 C) (Oral)   Resp 16   Wt 226 lb (102.5 kg)   SpO2 96%   BMI 30.65 kg/m  General: no acute respiratory distress  Psych:  Alert and oriented, normal mood and affect HEENT: Normocephalic, nasal congestion present, TMs w/o erythema, OP with erythema w/o exudate, + cervical LAD, supple neck  Cardiovascular: irreg irreg Respiratory:  Good breath sounds bilaterally, CTAB with normal respiratory effort Skin:  Warm, no rashes Neurologic:   Mental status is normal. normal gait  Assessment  1. Viral URI with cough      Plan   URI, viral: discussed dx; no sign or sx  of bacterial infection is present. Treat supportively with antihistamines, decongestants, and/or cough meds. See AVS for care instructions.   OK for ablation therapy unless develops fever or worsens.  Follow up: prn    Commons side effects, risks, benefits, and alternatives for medications and treatment plan prescribed today were discussed, and the patient expressed understanding of the given instructions. Patient is instructed to call or message via MyChart if he/she has any questions or concerns regarding our treatment plan. No barriers to understanding were identified. We discussed Red Flag symptoms and signs  in detail. Patient expressed understanding regarding what to do in case of urgent or emergency type symptoms.   Medication list was reconciled, printed and provided to the patient in AVS. Patient instructions and summary information was reviewed with the patient as documented in the AVS. This note was prepared with assistance of Dragon voice recognition software. Occasional wrong-word or sound-a-like substitutions may have occurred due to the inherent limitations of voice recognition software  No orders of the defined types were placed in this encounter.  No orders of the defined types were placed in this encounter.

## 2018-02-14 NOTE — Patient Instructions (Addendum)
Please follow up if symptoms do not improve or as needed.   You may use Delsym cough syrup or Mucinex DM to help with congestion and coughing.  What is an Upper Respiratory Infection? An Upper Respiratory Infection (URI), sometimes called a "cold," occurs when viruses attack the nose, throat and chest. A URI usually runs its course in 2-14 days, although most people feel better in about a week.  Do I need antibiotics? What if my mucus is green? When germs that cause colds first infect the nose and sinuses, the nose makes clear mucus. This helps wash the germs from the nose and sinuses. After two or three days, the body's immune cells fight back, changing the mucus to a white or yellow color. As the bacteria that live in the nose grow back, they may also be found in the mucus, which changes the mucus to a greenish color. This is normal and does not mean you or your child needs antibiotics.  Antibiotics are needed only if your healthcare provider tells you that you have a bacterial infection. Your healthcare provider may prescribe other medicine or give tips to help with a cold's symptoms, but antibiotics are not needed to treat a cold or runny nose.  How do I prevent spreading a URI to others? To prevent spreading to others, try the following: ? Stay at home while you are sick ? Avoid close contact with others, such as hugging, kissing, or shaking hands ? Move away from people before coughing or sneezing ? Cough and sneeze into a tissue then throw it away, or cough and sneeze into your upper shirt sleeve, completely covering your mouth and nose ? Wash your hands after coughing, sneezing, or blowing your nose ? Disinfect frequently touched surfaces, and objects such as toys and doorknobs  How is a URI treated? There is no cure for the common cold. For relief, try ? Getting plenty of rest  ? Drinking fluids ? Gargling with warm salt water and using cough drops or throat sprays ? Taking  over-the-counter pain or cold medicines. However: o Do not give aspirin to children. And do not give cough medicine to children under four.  o Over-the-counter medicines may help ease symptoms but will not make your cold go away faster. o ALWAYS read the label and use medications as directed.  What if I don't feel better? Follow up with your primary care provider if: ? Unusually severe cold symptoms or symptoms that last more than 10-14 days ? High fever (greater than 100.4F) ? Ear pain or sinus type headache ? Cough that gets worse while other cold symptoms improve ? Flare-up of any chronic lung problem, such as asthma ? If you develop any of the following you should go to the nearest urgent care center or emergency room: o shortness of breath, pain or pressure in the chest, high fever that doesn't get better with fever reducing medicine, confusion, fainting, severe vomiting, and/or severe facial pain.  For more information, visit: ? U.S. National Library of Medicine: http://www.nlm.nih.gov/medlineplus/ ? Centers for Disease Control: http://www.cdc.gov/getsmart/antibiotic-use/uri/  Sources: http://www.nlm.nih.gov/medlineplus/commoncold.html and http://www.cdc.gov/getsmart/antibiotic-use/URI/colds.html    

## 2018-02-15 ENCOUNTER — Encounter: Payer: Self-pay | Admitting: Family Medicine

## 2018-02-16 ENCOUNTER — Ambulatory Visit (HOSPITAL_COMMUNITY): Payer: Medicare Other | Admitting: Certified Registered Nurse Anesthetist

## 2018-02-16 ENCOUNTER — Encounter (HOSPITAL_COMMUNITY)
Admission: RE | Disposition: A | Discharge status: Home / Self Care | Payer: Self-pay | Source: Ambulatory Visit | Attending: Internal Medicine

## 2018-02-16 ENCOUNTER — Encounter (HOSPITAL_COMMUNITY): Payer: Self-pay | Admitting: Certified Registered Nurse Anesthetist

## 2018-02-16 ENCOUNTER — Ambulatory Visit (HOSPITAL_COMMUNITY)
Admission: RE | Admit: 2018-02-16 | Discharge: 2018-02-16 | Disposition: A | Discharge status: Home / Self Care | Payer: Medicare Other | Source: Ambulatory Visit | Attending: Internal Medicine | Admitting: Internal Medicine

## 2018-02-16 DIAGNOSIS — Z7901 Long term (current) use of anticoagulants: Secondary | ICD-10-CM | POA: Insufficient documentation

## 2018-02-16 DIAGNOSIS — I959 Hypotension, unspecified: Secondary | ICD-10-CM | POA: Diagnosis not present

## 2018-02-16 DIAGNOSIS — I4891 Unspecified atrial fibrillation: Secondary | ICD-10-CM

## 2018-02-16 DIAGNOSIS — I480 Paroxysmal atrial fibrillation: Secondary | ICD-10-CM | POA: Diagnosis not present

## 2018-02-16 DIAGNOSIS — F419 Anxiety disorder, unspecified: Secondary | ICD-10-CM | POA: Insufficient documentation

## 2018-02-16 DIAGNOSIS — R400 Somnolence: Secondary | ICD-10-CM | POA: Insufficient documentation

## 2018-02-16 DIAGNOSIS — I481 Persistent atrial fibrillation: Secondary | ICD-10-CM | POA: Diagnosis not present

## 2018-02-16 DIAGNOSIS — Z79899 Other long term (current) drug therapy: Secondary | ICD-10-CM | POA: Diagnosis not present

## 2018-02-16 DIAGNOSIS — R001 Bradycardia, unspecified: Secondary | ICD-10-CM | POA: Diagnosis not present

## 2018-02-16 DIAGNOSIS — Z87442 Personal history of urinary calculi: Secondary | ICD-10-CM | POA: Insufficient documentation

## 2018-02-16 DIAGNOSIS — E785 Hyperlipidemia, unspecified: Secondary | ICD-10-CM | POA: Insufficient documentation

## 2018-02-16 DIAGNOSIS — Z9889 Other specified postprocedural states: Secondary | ICD-10-CM | POA: Diagnosis not present

## 2018-02-16 DIAGNOSIS — F17200 Nicotine dependence, unspecified, uncomplicated: Secondary | ICD-10-CM | POA: Diagnosis not present

## 2018-02-16 HISTORY — PX: CARDIOVERSION: SHX1299

## 2018-02-16 SURGERY — CARDIOVERSION
Anesthesia: General

## 2018-02-16 MED ORDER — LIDOCAINE HCL (CARDIAC) PF 100 MG/5ML IV SOSY
PREFILLED_SYRINGE | INTRAVENOUS | Status: DC | PRN
  Administered 2018-02-16: 40 mg via INTRAVENOUS

## 2018-02-16 MED ORDER — SODIUM CHLORIDE 0.9 % IV SOLN: INTRAVENOUS | Status: DC

## 2018-02-16 MED ORDER — PROPOFOL 10 MG/ML IV BOLUS
INTRAVENOUS | Status: DC | PRN
  Administered 2018-02-16: 30 mg via INTRAVENOUS
  Administered 2018-02-16: 40 mg via INTRAVENOUS

## 2018-02-16 NOTE — Progress Notes (Signed)
Patient taking Eliquis  BID. Patient normally takes morning dose at noon. Patient did not take dose this morning due to his normal medication schedule. MD ok with patient taking dose when he arrives home at this normally scheduled time.

## 2018-02-16 NOTE — Anesthesia Postprocedure Evaluation (Signed)
Anesthesia Post Note  Patient: Jeremy King  Procedure(s) Performed: CARDIOVERSION (N/A )     Patient location during evaluation: Endoscopy Anesthesia Type: General Level of consciousness: awake Pain management: pain level controlled Vital Signs Assessment: post-procedure vital signs reviewed and stable Respiratory status: spontaneous breathing Cardiovascular status: stable Postop Assessment: no apparent nausea or vomiting Anesthetic complications: no    Last Vitals:  Vitals:   02/16/18 1012 02/16/18 1014  BP:  119/75  Pulse: (!) 51 (!) 58  Resp: 15 14  Temp:  36.6 C  SpO2: 95% 93%    Last Pain:  Vitals:   02/16/18 1014  TempSrc: Oral  PainSc: 0-No pain   Pain Goal:                 Deyra Perdomo JR,JOHN [place]

## 2018-02-16 NOTE — Anesthesia Preprocedure Evaluation (Signed)
Anesthesia Evaluation  Patient identified by MRN, date of birth, ID band Patient awake    Reviewed: Allergy & Precautions, NPO status , Patient's Chart, lab work & pertinent test results  Airway Mallampati: I       Dental no notable dental hx. (+) Teeth Intact   Pulmonary Current Smoker,    Pulmonary exam normal breath sounds clear to auscultation       Cardiovascular Normal cardiovascular exam+ dysrhythmias Atrial Fibrillation  Rhythm:Regular Rate:Normal     Neuro/Psych  Headaches, PSYCHIATRIC DISORDERS Anxiety    GI/Hepatic negative GI ROS, Neg liver ROS,   Endo/Other  negative endocrine ROS  Renal/GU   negative genitourinary   Musculoskeletal negative musculoskeletal ROS (+)   Abdominal Normal abdominal exam  (+)   Peds  Hematology negative hematology ROS (+)   Anesthesia Other Findings Impression Exercise Capacity:  Fair exercise capacity. BP Response:  Normal blood pressure response. Clinical Symptoms:  There is dyspnea. ECG Impression:  Insignificant upsloping ST segment depression. 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   Reproductive/Obstetrics                             Anesthesia Physical  Anesthesia Plan  ASA: III  Anesthesia Plan: General   Post-op Pain Management:    Induction: Intravenous  PONV Risk Score and Plan:   Airway Management Planned: Mask and Natural Airway  Additional Equipment:   Intra-op Plan:   Post-operative Plan:   Informed Consent: I have reviewed the patients History and Physical, chart, labs and discussed the procedure including the  risks, benefits and alternatives for the proposed anesthesia with the patient or authorized representative who has indicated his/her understanding and acceptance.     Plan Discussed with: CRNA  Anesthesia Plan Comments:         Anesthesia Quick Evaluation

## 2018-02-16 NOTE — Transfer of Care (Signed)
Immediate Anesthesia Transfer of Care Note  Patient: Jeremy King  Procedure(s) Performed: CARDIOVERSION (N/A )  Patient Location: Endoscopy Unit  Anesthesia Type:General  Level of Consciousness: awake, alert  and oriented  Airway & Oxygen Therapy: Patient Spontanous Breathing and Patient connected to nasal cannula oxygen  Post-op Assessment: Report given to RN, Post -op Vital signs reviewed and stable and Patient moving all extremities  Post vital signs: Reviewed and stable  Last Vitals:  Vitals Value Taken Time  BP    Temp    Pulse    Resp    SpO2      Last Pain:  Vitals:   02/16/18 0910  TempSrc: Oral  PainSc: 0-No pain         Complications: No apparent anesthesia complications

## 2018-02-16 NOTE — H&P (Signed)
   INTERVAL PROCEDURE H&P  History and Physical Interval Note:  02/16/2018 9:04 AM  Jeremy King has presented today for their planned procedure. The various methods of treatment have been discussed with the patient and family. After consideration of risks, benefits and other options for treatment, the patient has consented to the procedure.  The patients' outpatient history has been reviewed, patient examined, and no change in status from most recent office note within the past 30 days. I have reviewed the patients' chart and labs and will proceed as planned. Questions were answered to the patient's satisfaction.   Jeremy Nose, MD, [facility], FACP  [place]  [facility]  Medical Director of the Advanced Lipid Disorders &  Cardiovascular Risk Reduction Clinic Diplomate of the American Board of Clinical Lipidology Attending Cardiologist  Direct Dial: [phone]  Fax: [phone]  Website:  [email or web address] 02/16/2018, 9:04 AM

## 2018-02-16 NOTE — Discharge Instructions (Signed)
Electrical Cardioversion, Care After °This sheet gives you information about how to care for yourself after your procedure. Your health care provider may also give you more specific instructions. If you have problems or questions, contact your health care provider. °What can I expect after the procedure? °After the procedure, it is common to have: °· Some redness on the skin where the shocks were given. ° °Follow these instructions at home: °· Do not drive for 24 hours if you were given a medicine to help you relax (sedative). °· Take over-the-counter and prescription medicines only as told by your health care provider. °· Ask your health care provider how to check your pulse. Check it often. °· Rest for 48 hours after the procedure or as told by your health care provider. °· Avoid or limit your caffeine use as told by your health care provider. °Contact a health care provider if: °· You feel like your heart is beating too quickly or your pulse is not regular. °· You have a serious muscle cramp that does not go away. °Get help right away if: °· You have discomfort in your chest. °· You are dizzy or you feel faint. °· You have trouble breathing or you are short of breath. °· Your speech is slurred. °· You have trouble moving an arm or leg on one side of your body. °· Your fingers or toes turn cold or blue. °This information is not intended to replace advice given to you by your health care provider. Make sure you discuss any questions you have with your health care provider. °Document Released: 07/14/2013 Document Revised: 04/26/2016 Document Reviewed: 03/29/2016 °Elsevier Interactive Patient Education © 2018 Elsevier Inc. °Monitored Anesthesia Care, Care After °These instructions provide you with information about caring for yourself after your procedure. Your health care provider may also give you more specific instructions. Your treatment has been planned according to current medical practices, but problems  sometimes occur. Call your health care provider if you have any problems or questions after your procedure. °What can I expect after the procedure? °After your procedure, it is common to: °· Feel sleepy for several hours. °· Feel clumsy and have poor balance for several hours. °· Feel forgetful about what happened after the procedure. °· Have poor judgment for several hours. °· Feel nauseous or vomit. °· Have a sore throat if you had a breathing tube during the procedure. ° °Follow these instructions at home: °For at least 24 hours after the procedure: ° °· Do not: °? Participate in activities in which you could fall or become injured. °? Drive. °? Use heavy machinery. °? Drink alcohol. °? Take sleeping pills or medicines that cause drowsiness. °? Make important decisions or sign legal documents. °? Take care of children on your own. °· Rest. °Eating and drinking °· Follow the diet that is recommended by your health care provider. °· If you vomit, drink water, juice, or soup when you can drink without vomiting. °· Make sure you have little or no nausea before eating solid foods. °General instructions °· Have a responsible adult stay with you until you are awake and alert. °· Take over-the-counter and prescription medicines only as told by your health care provider. °· If you smoke, do not smoke without supervision. °· Keep all follow-up visits as told by your health care provider. This is important. °Contact a health care provider if: °· You keep feeling nauseous or you keep vomiting. °· You feel light-headed. °· You develop a rash. °·   You have a fever. °Get help right away if: °· You have trouble breathing. °This information is not intended to replace advice given to you by your health care provider. Make sure you discuss any questions you have with your health care provider. °Document Released: 01/14/2016 Document Revised: 05/15/2016 Document Reviewed: 01/14/2016 °Elsevier Interactive Patient Education © 2018  Elsevier Inc. ° °

## 2018-02-16 NOTE — CV Procedure (Signed)
   CARDIOVERSION NOTE  Procedure: Electrical Cardioversion Indications:  Atrial Fibrillation  Procedure Details:  Consent: Risks of procedure as well as the alternatives and risks of each were explained to the (patient/caregiver).  Consent for procedure obtained.  Time Out: Verified patient identification, verified procedure, site/side was marked, verified correct patient position, special equipment/implants available, medications/allergies/relevent history reviewed, required imaging and test results available.  Performed  Patient placed on cardiac monitor, pulse oximetry, supplemental oxygen as necessary.  Sedation given: Propofol per anesthesia Pacer pads placed anterior and posterior chest.  Cardioverted 2 time(s).  Cardioverted at 150J and 200J biphasic.  Impression: Findings: Post procedure EKG shows: sinus bradycardia with PAC's Complications: None Patient did tolerate procedure well.  Plan: 1. Successful DCCV to sinus brady with PAC's. 2. Continue anticoagulation and Multaq.  Time Spent Directly with the Patient:  30 minutes   Chrystie Nose, MD, [facility], FACP  [place]  [facility]  Medical Director of the Advanced Lipid Disorders &  Cardiovascular Risk Reduction Clinic Diplomate of the American Board of Clinical Lipidology Attending Cardiologist  Direct Dial: [phone]  Fax: [phone]  Website:  [name] 02/16/2018, 10:10 AM

## 2018-02-17 ENCOUNTER — Telehealth: Payer: Self-pay | Admitting: Internal Medicine

## 2018-02-17 NOTE — Telephone Encounter (Signed)
Left message to call back. Appointment scheduled for 5/15 at 145 with Dr. Rennis Golden.

## 2018-02-17 NOTE — Telephone Encounter (Signed)
Spoke with Pt who states he feels like he is back in Afib. He currently denies any symptoms but states he could feel it in his pulse. He reports current BP is 111/75 HR 67. Confirmed appointment with Pt tomorrow.

## 2018-02-17 NOTE — Telephone Encounter (Signed)
New patient    Patient states he is in afib today.  STAT if HR is under 50 or over 120 (normal HR is 60-100 beats per minute)  1) What is your heart rate? Patient unsure  2) Do you have a log of your heart rate readings (document readings)? NO  3) Do you have any other symptoms? No symptoms

## 2018-02-17 NOTE — Telephone Encounter (Signed)
LMTCB

## 2018-02-18 ENCOUNTER — Encounter: Payer: Self-pay | Admitting: Internal Medicine

## 2018-02-18 ENCOUNTER — Ambulatory Visit: Payer: Medicare Other | Admitting: Internal Medicine

## 2018-02-18 VITALS — BP 119/79 | HR 74 | Ht 72.0 in | Wt 225.6 lb

## 2018-02-18 DIAGNOSIS — R5383 Other fatigue: Secondary | ICD-10-CM | POA: Diagnosis not present

## 2018-02-18 DIAGNOSIS — I4891 Unspecified atrial fibrillation: Secondary | ICD-10-CM | POA: Diagnosis not present

## 2018-02-18 MED ORDER — METOPROLOL TARTRATE 25 MG PO TABS: 12.5000 mg | ORAL_TABLET | Freq: Two times a day (BID) | ORAL | 1 refills | Status: DC

## 2018-02-18 NOTE — Progress Notes (Signed)
OFFICE NOTE  Chief Complaint:  Cardioversion, patient feels that he may be back in A. fib  Primary Care Physician: [name], MD  HPI:  Jeremy King is a pleasant 67 year old male with few medical problems. He has a history of anxiety as well as dyslipidemia. Unfortunately he's had a number of syncopal episodes and has known orthostatic hypotension. He tells me that couple years ago he was seen at the [facility] and syncopized. Blood pressure was noted to be 60 over palpation. He was hospitalized for this and treated but is had recurrent hypotension. He was previously on Flomax which was discontinued. He does have a history of kidney stones with what sounds like ureteral stenting and/or lithotripsy. He probably saw a cardiologist once at the [place] and he tells me that they did not recommend any different medications. His CHADSVASC score is 1 given his age of 38 and hypertension. He has been maintained on aspirin. Recently he was seen in [facility] after a syncopal episode. He underwent cardiac workup including echocardiogram which shows normal LV function. He was noted to be in A. fib at that time. He says that it comes and goes but is not clear whether it has become more persistent. He's never had a cardioversion before. He denies significant alcohol use but has been a long-term smoker. There also is a strong family history of coronary disease in both parents. Recently he's been having problems with his knee and is contemplating left knee surgery with Dr. Ciro Backer.  It should be noted in the office today that he became dizzy after sitting up for his EKG. Orthostatic blood pressures were taken and he had a 20 point drop in systolic blood pressure with change in position.  I saw Jeremy King back in the office today. Unfortunately his home monitor has been alarming as he's had frequent atrial fibrillation with rapid ventricular response. Heart rates have been up into the 180s. He  also had a near-syncopal episode. He unfortunately did not check his blood pressure at that time. He's been pretty much in persistent A. fib since we placed the monitor. I think this is at least contributing to his symptoms. 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 Exam is not consistent with heart failure. Blood pressure is slightly improved today at 100 systolic, however it is limited any AV nodal blocking medications. He's currently only on aspirin because of a low CHADSVASC score.  Jeremy King returns today for follow-up. He successfully cardioverted and is maintaining sinus bradycardia. He's worn a Holter monitor which demonstrates several episodes of significant bradycardia into the 30s. He's had some sinus pauses of 2-1/2-3 seconds, which appear mostly at night. Laboratory work is unremarkable and digoxin level is 1.7. The digoxin, again was started for heart rate control prior to cardioversion as his blood pressure was less than 90 systolic with significant orthostasis. I also discontinued his trazodone which seems to have significantly improved his orthostasis. Blood pressure remains low today at 98/50. He underwent a nuclear stress test which showed 2 small fixed defects which were thought to be either small areas of scar or artifact. His echo showed normal wall motion and preserved systolic function. He is not describing anginal symptoms.  Jeremy King returns today for follow-up. He has since seen Dr. Johney Frame who put him on multiecho for control of his atrial fibrillation. Overall he feels that he may be doing well with this although yesterday he thought his heart rate  was irregular. From what he described it sounds like it was regularly irregular, this may not be atrial fibrillation. He seems to be tolerating this and the Eliquis without any bleeding problems. He is come off of trazodone due to problems with orthostatic hypotension and syncope. He's not had any further episodes. He is  having problems sleeping at night though. He was on melatonin which was initially helpful but however since it has not been helpful. In the past we talked about the possibility of obstructive sleep apnea or other sleep related disorder. He was hesitant to get a sleep study but now seems to be interested in that.  09/19/2016  Jeremy King returns for follow-up. Overall he is doing well without recurrent atrial fibrillation. He is maintained on multiecho. She is EKG shows sinus rhythm with a PAC an incomplete right bundle branch block. QTC is 437 ms. He is on Eliquis and denies any significant bleeding problems. He uses melatonin for sleep at night and takes 15 mg. Is generally helps him get 5-6 hours of sleep. He denies any chest pain or worsening shortness of breath.  02/05/2018  Jeremy King was seen today in follow-up.  He says he is feeling well but unfortunately had a recent fall.  He said he was building a ramp for his kayak and somehow tripped and landed on the edge of the ramp.  He has a large area of ecchymosis over the right cheek which extends down into the right anterior neck and some suborbital ecchymosis.  He denies any pain or tenderness concerning for fracture.  He had no mental status changes or loss of consciousness.  He had no significant external bleeding.  He continues to take his Eliquis.  He says is been compliant with it and has not missed doses.  Of note, he is back in atrial fibrillation today.  He was very surprised to hear this since he is feeling well.  In the past he has been very symptomatic and has felt dizzy and passed out.  It may be because his rate is better controlled on [place] around.  This is the first episode of A. fib in the past several years.  02/18/2018  Jeremy King returns today for follow-up.  He underwent cardioversion on Monday and successfully converted back to sinus rhythm after single shock.  He says he actually felt a lot better with some more energy however  yesterday felt somewhat fatigued.  He checked his blood pressure and his machine noted that he was out of rhythm.  He also felt somewhat fatigued.  Today's EKG shows she is back in A. fib at 74.  This represents breakthrough of his Multitak.  We discussed options for treatment today including washing out his Multitak and switching to an alternate antiarrhythmic medication or referral for A. fib ablation.  He would like to try medical therapy.  I mention if we were to start a 1C agent, he would need some ischemia evaluation since his last stress test was in 2016.  PMHx:  Past Medical History:  Diagnosis Date  . Back ache   . Hypotension   . Kidney stones   . Paroxysmal atrial fibrillation (HCC)    chads2vasc score is at least 1  . Syncope    due to hypotensin/ complicated by RVR with afib    Past Surgical History:  Procedure Laterality Date  . CARDIOVERSION N/A 12/30/2014   Procedure: CARDIOVERSION;  Surgeon: Chrystie Nose, MD;  Location: [facility] ENDOSCOPY;  Service:  Cardiovascular;  Laterality: N/A;  . CARDIOVERSION N/A 02/16/2018   Procedure: CARDIOVERSION;  Surgeon: Chrystie Nose, MD;  Location: [facility] ENDOSCOPY;  Service: Cardiovascular;  Laterality: N/A;  . LITHOTRIPSY    . TEE WITHOUT CARDIOVERSION N/A 12/30/2014   Procedure: TRANSESOPHAGEAL ECHOCARDIOGRAM (TEE);  Surgeon: Chrystie Nose, MD;  Location: [facility] ENDOSCOPY;  Service: Cardiovascular;  Laterality: N/A;    FAMHx:  Family History  Problem Relation Age of Onset  . Heart failure Mother        mitral valve disease  . Diabetes Father   . Heart failure Father        MI  . Hypertension Father   . CAD Father   . Alcoholism Father   . Cancer Sister     SOCHx:   reports that he has been smoking cigarettes.  He has been smoking about 1.00 pack per day. He has quit using smokeless tobacco. He reports that he does not drink alcohol or use drugs.  ALLERGIES:  Allergies  Allergen Reactions  . Sertraline Hcl Other (See Comments)     Extreme headaches  . Trazodone And Nefazodone Other (See Comments)    Irregular heart beat? Hypotension?    ROS: Pertinent items noted in HPI and remainder of comprehensive ROS otherwise negative.  HOME MEDS: Current Outpatient Medications  Medication Sig Dispense Refill  . apixaban (ELIQUIS) 5 MG TABS tablet Take 1 tablet (5 mg total) by mouth 2 (two) times daily. Schedule MD appt for further refills 180 tablet 1  . Ascorbic Acid (VITAMIN C) 1000 MG tablet Take 1,000 mg by mouth daily.    . benzonatate (TESSALON) 100 MG capsule Take 1 capsule (100 mg total) by mouth 2 (two) times daily as needed for cough. 20 capsule 0  . clonazePAM (KLONOPIN) 1 MG tablet Take 1 tablet (1 mg total) by mouth 2 (two) times daily as needed. for anxiety (Patient taking differently: Take 1 mg by mouth 2 (two) times daily as needed for anxiety. ) 60 tablet 3  . dronedarone (MULTAQ) 400 MG tablet Take 1 tablet (400 mg total) by mouth 2 (two) times daily with a meal. NEED OV. 180 tablet 0  . Garlic 1000 MG CAPS Take 1,000 mg by mouth daily.     . Hypromellose (ARTIFICIAL TEARS OP) Place 1 drop into both eyes daily as needed (for dry eyes).    [name] ibuprofen (ADVIL,MOTRIN) 200 MG tablet Take 800 mg by mouth every 6 (six) hours as needed for moderate pain (pain).    . Melatonin 1 MG TABS Take 2 mg by mouth at bedtime.     . multivitamin (ONE-A-DAY MEN'S) TABS tablet Take 1 tablet by mouth daily.    . Omega-3 Fatty Acids (FISH OIL) 1000 MG CAPS Take 1,000 mg by mouth 2 (two) times daily.     [name] triamcinolone cream (KENALOG) 0.1 % Apply 1 application topically 2 (two) times daily. 30 g 0  . venlafaxine XR (EFFEXOR-XR) 150 MG 24 hr capsule TAKE 1 CAPSULE(150 MG) BY MOUTH DAILY WITH BREAKFAST 90 capsule 2   No current facility-administered medications for this visit.     LABS/IMAGING: No results found for this or any previous visit (from the past 48 hour(s)). No results found.  VITALS: There were no vitals taken for  this visit.  EXAM: General appearance: alert, fatigued and no distress Neck: no carotid bruit, no JVD and thyroid not enlarged, symmetric, no tenderness/mass/nodules Lungs: clear to auscultation bilaterally Heart: regular rate and rhythm, S1,  S2 normal, no murmur, click, rub or gallop Abdomen: soft, non-tender; bowel sounds normal; no masses,  no organomegaly Extremities: extremities normal, atraumatic, no cyanosis or edema Pulses: 2+ and symmetric Skin: Skin color, texture, turgor normal. No rashes or lesions Psych: Anxious  EKG: A. fib at 74, RBBB-personally reviewed  ASSESSMENT: 1. Recurrent atrial fibrillation -  on Multaq with recent cardioversion (CHADSVASC 2) 2. RBBB 3. Normal LV function by recent echocardiogram 4. Remote syncopal episodes 5. History of bradycardia with pauses 6. Fatigue, non-restorative sleep  PLAN: 1.   Jeremy King is back in A. fib after cardioversion on Monday which was successful.  This represents breakthrough of his Multaq.  I recommend we washed out of this and start him on flecainide 50 mg twice daily, however we need to assure he is nonischemic prior to that.  His last stress test was in 2016.  We will order a repeat Myoview stress test.  This should give [place] a couple weeks to stop the antiarrhythmic prior to that.  Then we will plan at least a couple weeks on flecainide prior to repeat cardioversion.  I also discussed ablation is a possibility however he is not interested at this time and would rather try to additional medication.  Aloe up with me in 1 month and will consider repeat cardioversion at that time.  Chrystie Nose, MD, [facility], FACP  [place]  [facility]  Medical Director of the Advanced Lipid Disorders &  Cardiovascular Risk Reduction Clinic Diplomate of the American Board of Clinical Lipidology Attending Cardiologist  Direct Dial: [phone]  Fax: [phone]  Website:  [name] [email or web address] 02/18/2018, 2:06  PM

## 2018-02-18 NOTE — Patient Instructions (Addendum)
Medication Instructions:   STOP multaq START metoprolol tartrate 12.5mg  twice daily  Labwork:  NONE  Testing/Procedures:  Dr. Rennis Golden has ordered a Steffanie Dunn  Follow-Up:  Your physician recommends that you schedule a follow-up appointment after testing (OK to double book)   If you need a refill on your cardiac medications before your next appointment, please call your pharmacy.  Any Other Special Instructions Will Be Listed Below (If Applicable).   Stress Test Instructions  Please arrive 15 minutes prior to your appointment time for registration and insurance purposes.   The test will take approximately 3 to 4 hours to complete; you may bring reading material.  If someone comes with you to your appointment, they will need to remain in the main lobby due to limited space in the testing area.    How to prepare for your Myocardial Perfusion Test:  Do not eat or drink 3 hours prior to your test, except you may have water.  Do not consume products containing caffeine (regular or decaffeinated) 12 hours prior to your test. (ex: coffee, chocolate, sodas, tea).  Do wear comfortable clothes (no dresses or overalls) and walking shoes, tennis shoes preferred (No heels or open toe shoes are allowed).  Do NOT wear cologne, perfume, aftershave, or lotions (deodorant is allowed).  If you use an inhaler, use it the AM of your test and bring it with you.   If you use a nebulizer, use it the AM of your test.   If these instructions are not followed, your test will have to be rescheduled.

## 2018-02-25 ENCOUNTER — Telehealth (HOSPITAL_COMMUNITY): Payer: Self-pay

## 2018-02-25 NOTE — Telephone Encounter (Signed)
Encounter complete. 

## 2018-02-27 ENCOUNTER — Ambulatory Visit (HOSPITAL_COMMUNITY)
Admission: RE | Admit: 2018-02-27 | Discharge: 2018-02-27 | Disposition: A | Discharge status: Home / Self Care | Payer: Medicare Other | Source: Ambulatory Visit | Attending: Cardiology | Admitting: Cardiology

## 2018-02-27 DIAGNOSIS — I4891 Unspecified atrial fibrillation: Secondary | ICD-10-CM | POA: Insufficient documentation

## 2018-02-27 DIAGNOSIS — R5383 Other fatigue: Secondary | ICD-10-CM | POA: Diagnosis not present

## 2018-02-27 LAB — MYOCARDIAL PERFUSION IMAGING
CHL CUP NUCLEAR SRS: 1
CHL CUP NUCLEAR SSS: 2
CSEPPHR: 105 {beats}/min
LV dias vol: 140 mL (ref 62–150)
LV sys vol: 72 mL
Rest HR: 85 {beats}/min
SDS: 1
TID: 1.18

## 2018-02-27 MED ORDER — TECHNETIUM TC 99M TETROFOSMIN IV KIT
10.2000 | PACK | Freq: Once | INTRAVENOUS | Status: AC | PRN
  Administered 2018-02-27: 10.2 via INTRAVENOUS
  Filled 2018-02-27: qty 11

## 2018-02-27 MED ORDER — REGADENOSON 0.4 MG/5ML IV SOLN
0.4000 mg | Freq: Once | INTRAVENOUS | Status: AC
  Administered 2018-02-27: 0.4 mg via INTRAVENOUS

## 2018-02-27 MED ORDER — TECHNETIUM TC 99M TETROFOSMIN IV KIT
30.4000 | PACK | Freq: Once | INTRAVENOUS | Status: AC | PRN
  Administered 2018-02-27: 30.4 via INTRAVENOUS
  Filled 2018-02-27: qty 31

## 2018-03-09 ENCOUNTER — Ambulatory Visit: Payer: Medicare Other | Admitting: Internal Medicine

## 2018-03-09 ENCOUNTER — Encounter: Payer: Self-pay | Admitting: Internal Medicine

## 2018-03-09 VITALS — BP 116/78 | HR 94 | Ht 72.0 in | Wt 220.2 lb

## 2018-03-09 DIAGNOSIS — R9439 Abnormal result of other cardiovascular function study: Secondary | ICD-10-CM

## 2018-03-09 DIAGNOSIS — Z01818 Encounter for other preprocedural examination: Secondary | ICD-10-CM

## 2018-03-09 DIAGNOSIS — I4891 Unspecified atrial fibrillation: Secondary | ICD-10-CM | POA: Diagnosis not present

## 2018-03-09 DIAGNOSIS — R5383 Other fatigue: Secondary | ICD-10-CM | POA: Diagnosis not present

## 2018-03-09 NOTE — Progress Notes (Signed)
OFFICE NOTE  Chief Complaint:  Follow-up stress test  Primary Care Physician: Myrlene Brokerrawford, Elizabeth A, MD  HPI:  Jeremy King is a pleasant 67 year old male with few medical problems. He has a history of anxiety as well as dyslipidemia. Unfortunately he's had a number of syncopal episodes and has known orthostatic hypotension. He tells me that couple years ago he was [facility] and syncopized. Blood pressure was noted to be 60 over palpation. He was hospitalized for this and treated but is had recurrent hypotension. He was previously on Flomax which was discontinued. He does have a history of kidney stones with what sounds like ureteral stenting and/or lithotripsy. He probably saw a cardiologist once at the [facility] and he tells me that they did not recommend any different medications. His CHADSVASC score is 1 given his age of 67 and hypertension. He has been maintained on aspirin. Recently he was seen in [facility] after a syncopal episode. He underwent cardiac workup including echocardiogram which shows normal LV function. He was noted to be in A. fib at that time. He says that it comes and goes but is not clear whether it has become more persistent. He's never had a cardioversion before. He denies significant alcohol use but has been a long-term smoker. There also is a strong family history of coronary disease in both parents. Recently he's been having problems with his knee and is contemplating left knee surgery with Dr. Ciro BackerGiofree.  It should be noted in the office today that he became dizzy after sitting up for his EKG. Orthostatic blood pressures were taken and he had a 20 point drop in systolic blood pressure with change in position.  I saw Mr. Jeremy King back in the office today. Unfortunately his home monitor has been alarming as he's had frequent atrial fibrillation with rapid ventricular response. Heart rates have been up into the 180s. He also had a near-syncopal episode. He  unfortunately did not check his blood pressure at that time. He's been pretty much in persistent A. fib since we placed the monitor. I think this is at least contributing to his symptoms. 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 Exam is not consistent with heart failure. Blood pressure is slightly improved today at 100 systolic, however it is limited any AV nodal blocking medications. He's currently only on aspirin because of a low CHADSVASC score.  Mr. Jeremy King returns today for follow-up. He successfully cardioverted and is maintaining sinus bradycardia. He's worn a Holter monitor which demonstrates several episodes of significant bradycardia into the 30s. He's had some sinus pauses of 2-1/2-3 seconds, which appear mostly at night. Laboratory work is unremarkable and digoxin level is 1.7. The digoxin, again was started for heart rate control prior to cardioversion as his blood pressure was less than 90 systolic with significant orthostasis. I also discontinued his trazodone which seems to have significantly improved his orthostasis. Blood pressure remains low today at 98/50. He underwent a nuclear stress test which showed 2 small fixed defects which were thought to be either small areas of scar or artifact. His echo showed normal wall motion and preserved systolic function. He is not describing anginal symptoms.  Mr. Jeremy King returns today for follow-up. He has since seen Dr. Johney FrameAllred who put him on multiecho for control of his atrial fibrillation. Overall he feels that he may be doing well with this although yesterday he thought his heart rate was irregular. From what he described it sounds  like it was regularly irregular, this may not be atrial fibrillation. He seems to be tolerating this and the Eliquis without any bleeding problems. He is come off of trazodone due to problems with orthostatic hypotension and syncope. He's not had any further episodes. He is having problems sleeping at night  though. He was on melatonin which was initially helpful but however since it has not been helpful. In the past we talked about the possibility of obstructive sleep apnea or other sleep related disorder. He was hesitant to get a sleep study but now seems to be interested in that.  09/19/2016  Mr. Jeremy King returns for follow-up. Overall he is doing well without recurrent atrial fibrillation. He is maintained on multiecho. She is EKG shows sinus rhythm with a PAC an incomplete right bundle branch block. QTC is 437 ms. He is on Eliquis and denies any significant bleeding problems. He uses melatonin for sleep at night and takes 15 mg. Is generally helps him get 5-6 hours of sleep. He denies any chest pain or worsening shortness of breath.  02/05/2018  Mr. Jeremy King was seen today in follow-up.  He says he is feeling well but unfortunately had a recent fall.  He said he was building a ramp for his kayak and somehow tripped and landed on the edge of the ramp.  He has a large area of ecchymosis over the right cheek which extends down into the right anterior neck and some suborbital ecchymosis.  He denies any pain or tenderness concerning for fracture.  He had no mental status changes or loss of consciousness.  He had no significant external bleeding.  He continues to take his Eliquis.  He says is been compliant with it and has not missed doses.  Of note, he is back in atrial fibrillation today.  He was very surprised to hear this since he is feeling well.  In the past he has been very symptomatic and has felt dizzy and passed out.  It may be because his rate is better controlled on Platea around.  This is the first episode of A. fib in the past several years.  02/18/2018  Mr. Jeremy King returns today for follow-up.  He underwent cardioversion on Monday and successfully converted back to sinus rhythm after single shock.  He says he actually felt a lot better with some more energy however yesterday felt somewhat fatigued.   He checked his blood pressure and his machine noted that he was out of rhythm.  He also felt somewhat fatigued.  Today's EKG shows she is back in A. fib at 74.  This represents breakthrough of his Multitak.  We discussed options for treatment today including washing out his Multitak and switching to an alternate antiarrhythmic medication or referral for A. fib ablation.  He would like to try medical therapy.  I mention if we were to start a 1C agent, he would need some ischemia evaluation since his last stress test was in 2016.  03/09/2018  Mr. Richart returns for follow-up of stress test.  He underwent a nuclear stress test on 02/27/2018.  This showed a mildly reduced LVEF at 49%.  There was mild septal and inferior wall ischemia from the apex to base.  This was suggested as low risk however abnormal.  He denies any chest pain or worsening shortness of breath.  He does report some fatigue however this may be related to A. fib.  He noted to be have slightly more energy after converting back to sinus  rhythm for about a day and then went back into A. fib.  This is why proposed antiarrhythmic therapy.  Previously had been on Multaq for years and then broke through with recurrent AF.  The plan was to switch him to a different antiarrhythmic such as flecainide, but we needed to make sure that he had no coronary disease prior to that.  I discussed the stress test with him including the fact that it may represent a false positive.  Nonetheless a definitive coronary evaluation is warranted.  It could be also that if he has underlying coronary disease, that may be why he did not remain in normal rhythm after a successful cardioversion.  PMHx:  Past Medical History:  Diagnosis Date  . Back ache   . Hypotension   . Kidney stones   . Paroxysmal atrial fibrillation (HCC)    chads2vasc score is at least 1  . Syncope    due to hypotensin/ complicated by RVR with afib    Past Surgical History:  Procedure Laterality  Date  . CARDIOVERSION N/A 12/30/2014   Procedure: CARDIOVERSION;  Surgeon: Chrystie Nose, MD;  [facility] END[facility]COPY;  Service: Cardiovascular;  Laterality: N/A;  . CARDIOVERSION N/A 02/16/2018   Procedure: CARDIOVERSION;  Surgeon: Chrystie Nose, MD;  Location: [facility] ENDOSCOPY;  Service: Cardiovascular;  Laterality: N/A;  . LITHOTRIPSY    . TEE WITHOUT CARDIOVERSION N/A 12/30/2014   Procedure: TRANSESOPHAGEAL ECHOCARDIOGRAM (TEE);  Surgeon: Chrystie Nose, MD;  Location[facility] ENDOSCOPY;  Service: Cardiovascular;  Laterality: N/A;    FAMHx:  Family History  Problem Relation Age of Onset  . Heart failure Mother        mitral valve disease  . Diabetes Father   . Heart failure Father        MI  . Hypertension Father   . CAD Father   . Alcoholism Father   . Cancer Sister     SOCHx:   reports that he has been smoking cigarettes.  He has been smoking about 1.00 pack per day. He has quit using smokeless tobacco. He reports that he does not drink alcohol or use drugs.  ALLERGIES:  Allergies  Allergen Reactions  . Sertraline Hcl Other (See Comments)    Extreme headaches  . Trazodone And Nefazodone Other (See Comments)    Irregular heart beat? Hypotension?    ROS: Pertinent items noted in HPI and remainder of comprehensive ROS otherwise negative.  HOME MEDS: Current Outpatient Medications  Medication Sig Dispense Refill  . apixaban (ELIQUIS) 5 MG TABS tablet Take 1 tablet (5 mg total) by mouth 2 (two) times daily. Schedule MD appt for further refills 180 tablet 1  . Ascorbic Acid (VITAMIN C) 1000 MG tablet Take 1,000 mg by mouth daily.    . benzonatate (TESSALON) 100 MG capsule Take 1 capsule (100 mg total) by mouth 2 (two) times daily as needed for cough. 20 capsule 0  . clonazePAM (KLONOPIN) 1 MG tablet Take 1 tablet (1 mg total) by mouth 2 (two) times daily as needed. for anxiety 60 tablet 3  . Garlic 1000 MG CAPS Take 1,000 mg by mouth daily.     . Hypromellose (ARTIFICIAL  TEARS OP) Place 1 drop into both eyes daily as needed (for dry eyes).    [name] ibuprofen (ADVIL,MOTRIN) 200 MG tablet Take 800 mg by mouth every 6 (six) hours as needed for moderate pain (pain).    . Melatonin 1 MG TABS Take 2 mg by mouth at  bedtime.     . metoprolol tartrate (LOPRESSOR) 25 MG tablet Take 0.5 tablets (12.5 mg total) by mouth 2 (two) times daily. 90 tablet 1  . multivitamin (ONE-A-DAY MEN'S) TABS tablet Take 1 tablet by mouth daily.    . Omega-3 Fatty Acids (FISH OIL) 1000 MG CAPS Take 1,000 mg by mouth 2 (two) times daily.     [name] triamcinolone cream (KENALOG) 0.1 % Apply 1 application topically 2 (two) times daily. 30 g 0  . venlafaxine XR (EFFEXOR-XR) 150 MG 24 hr capsule TAKE 1 CAPSULE(150 MG) BY MOUTH DAILY WITH BREAKFAST 90 capsule 2   No current facility-administered medications for this visit.     LABS/IMAGING: No results found for this or any previous visit (from the past 48 hour(s)). No results found.  VITALS: BP 116/78   Pulse 94   Ht 6' (1.829 m)   Wt 220 lb 3.2 oz (99.9 kg)   BMI 29.86 kg/m   EXAM: General appearance: alert, fatigued and no distress Neck: no carotid bruit, no JVD and thyroid not enlarged, symmetric, no tenderness/mass/nodules Lungs: clear to auscultation bilaterally Heart: regular rate and rhythm, S1, S2 normal, no murmur, click, rub or gallop Abdomen: soft, non-tender; bowel sounds normal; no masses,  no organomegaly Extremities: extremities normal, atraumatic, no cyanosis or edema Pulses: 2+ and symmetric Skin: Skin color, texture, turgor normal. No rashes or lesions Psych: Anxious  EKG: Deferred  ASSESSMENT: 1. Abnormal Myoview stress test-LVEF 49%, mild inferior ischemia (02/2018) 2. Recurrent atrial fibrillation -  on Multaq with recent cardioversion (CHADSVASC 2) 3. RBBB 4. Normal LV function by recent echocardiogram 5. Remote syncopal episodes 6. History of bradycardia with pauses 7. Fatigue, non-restorative sleep  PLAN: 1.    Mr. Fyock was found to have mild inferior ischemia on the stress test with an EF of 49%.  It is possible this could be artifactual however since he has some recurrent A. fib and were considering antiarrhythmic therapy, possibly with a class Ic agent, I would recommend definitive heart catheterization to exclude any coronary disease.  I discussed the risk, benefits and alternatives with him, including noninvasive imaging with CT coronary angiography or perhaps keeping him in A. fib and pursuing anticoagulation and rate control rather than rhythm control.  Since he felt symptomatic with his A. fib and did feel better after cardioversion, I would recommend we continue to try to get him back into rhythm.  For that, we do need to exclude coronary disease.  He is agreeable to cardiac catheterization and will plan follow-up to review antiarrhythmic therapy if his catheterization is negative for ischemia.  Chrystie Nose, MD, [facility], FACP  [place]  [facility]  Medical Director of the [facility]  Cardiovascular Risk Reduction Clinic Diplomate of the American Board of Clinical Lipidology Attending Cardiologist  Direct Dial: [phone]  Fax: [phone]  Website:  [name] Oates 03/09/2018, 4:09 PM

## 2018-03-09 NOTE — H&P (View-Only) (Signed)
OFFICE NOTE  Chief Complaint:  Follow-up stress test  Primary Care Physician: Myrlene Brokerrawford, Elizabeth A, MD  HPI:  Jeremy King is a pleasant 67 year old male with few medical problems. He has a history of anxiety as well as dyslipidemia. Unfortunately he's had a number of syncopal episodes and has known orthostatic hypotension. He tells me that couple years ago he was [facility] and syncopized. Blood pressure was noted to be 60 over palpation. He was hospitalized for this and treated but is had recurrent hypotension. He was previously on Flomax which was discontinued. He does have a history of kidney stones with what sounds like ureteral stenting and/or lithotripsy. He [facility] a [facility] at [facility] and he tells me that they did not recommend any different medications. His CHADSVASC score is 1 given his age of 67 and hypertension. He has been maintained on aspirin. Recently he was seen in [facility] after a syncopal episode. He underwent cardiac workup including echocardiogram which shows normal LV function. He was noted to be in A. fib at that time. He says that it comes and goes but is not clear whether it has become more persistent. He's never had a cardioversion before. He denies significant alcohol use but has been a long-term smoker. There also is a strong family history of coronary disease in both parents. Recently he's been having problems with his knee and is contemplating left knee surgery with Dr. Ciro BackerGiofree.  It should be noted in the office today that he became dizzy after sitting up for his EKG. Orthostatic blood pressures were taken and he had a 20 point drop in systolic blood pressure with change in position.  I saw Jeremy King back in the office today. Unfortunately his home monitor has been alarming as he's had frequent atrial fibrillation with rapid ventricular response. Heart rates have been up into the 180s. He also had a near-syncopal episode. He  unfortunately did not check his blood pressure at that time. He's been pretty much in persistent A. fib since we placed the monitor. I think this is at least contributing to his symptoms. 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 Exam is not consistent with heart failure. Blood pressure is slightly improved today at 100 systolic, however it is limited any AV nodal blocking medications. He's currently only on aspirin because of a low CHADSVASC score.  Jeremy King returns today for follow-up. He successfully cardioverted and is maintaining sinus bradycardia. He's worn a Holter monitor which demonstrates several episodes of significant bradycardia into the 30s. He's had some sinus pauses of 2-1/2-3 seconds, which appear mostly at night. Laboratory work is unremarkable and digoxin level is 1.7. The digoxin, again was started for heart rate control prior to cardioversion as his blood pressure was less than 90 systolic with significant orthostasis. I also discontinued his trazodone which seems to have significantly improved his orthostasis. Blood pressure remains low today at 98/50. He underwent a nuclear stress test which showed 2 small fixed defects which were thought to be either small areas of scar or artifact. His echo showed normal wall motion and preserved systolic function. He is not describing anginal symptoms.  Jeremy King returns today for follow-up. He has since seen Dr. Johney FrameAllred who put him on multiecho for control of his atrial fibrillation. Overall he feels that he may be doing well with this although yesterday he thought his heart rate was irregular. From what he described it sounds  like it was regularly irregular, this may not be atrial fibrillation. He seems to be tolerating this and the Eliquis without any bleeding problems. He is come off of trazodone due to problems with orthostatic hypotension and syncope. He's not had any further episodes. He is having problems sleeping at night  though. He was on melatonin which was initially helpful but however since it has not been helpful. In the past we talked about the possibility of obstructive sleep apnea or other sleep related disorder. He was hesitant to get a sleep study but now seems to be interested in that.  09/19/2016  Jeremy King returns for follow-up. Overall he is doing well without recurrent atrial fibrillation. He is maintained on multiecho. She is EKG shows sinus rhythm with a PAC an incomplete right bundle branch block. QTC is 437 ms. He is on Eliquis and denies any significant bleeding problems. He uses melatonin for sleep at night and takes 15 mg. Is generally helps him get 5-6 hours of sleep. He denies any chest pain or worsening shortness of breath.  02/05/2018  Jeremy King was seen today in follow-up.  He says he is feeling well but unfortunately had a recent fall.  He said he was building a ramp for his kayak and somehow tripped and landed on the edge of the ramp.  He has a large area of ecchymosis over the right cheek which extends down into the right anterior neck and some suborbital ecchymosis.  He denies any pain or tenderness concerning for fracture.  He had no mental status changes or loss of consciousness.  He had no significant external bleeding.  He continues to take his Eliquis.  He says is been compliant with it and has not missed doses.  Of note, he is back in atrial fibrillation today.  He was very surprised to hear this since he is feeling well.  In the past he has been very symptomatic and has felt dizzy and passed out.  It may be because his rate is better controlled on Platea around.  This is the first episode of A. fib in the past several years.  02/18/2018  Jeremy King returns today for follow-up.  He underwent cardioversion on Monday and successfully converted back to sinus rhythm after single shock.  He says he actually felt a lot better with some more energy however yesterday felt somewhat fatigued.   He checked his blood pressure and his machine noted that he was out of rhythm.  He also felt somewhat fatigued.  Today's EKG shows she is back in A. fib at 74.  This represents breakthrough of his Multitak.  We discussed options for treatment today including washing out his Multitak and switching to an alternate antiarrhythmic medication or referral for A. fib ablation.  He would like to try medical therapy.  I mention if we were to start a 1C agent, he would need some ischemia evaluation since his last stress test was in 2016.  03/09/2018  Jeremy King returns for follow-up of stress test.  He underwent a nuclear stress test on 02/27/2018.  This showed a mildly reduced LVEF at 49%.  There was mild septal and inferior wall ischemia from the apex to base.  This was suggested as low risk however abnormal.  He denies any chest pain or worsening shortness of breath.  He does report some fatigue however this may be related to A. fib.  He noted to be have slightly more energy after converting back to sinus  rhythm for about a day and then went back into A. fib.  This is why proposed antiarrhythmic therapy.  Previously had been on Multaq for years and then broke through with recurrent AF.  The plan was to switch him to a different antiarrhythmic such as flecainide, but we needed to make sure that he had no coronary disease prior to that.  I discussed the stress test with him including the fact that it may represent a false positive.  Nonetheless a definitive coronary evaluation is warranted.  It could be also that if he has underlying coronary disease, that may be why he did not remain in normal rhythm after a successful cardioversion.  PMHx:  Past Medical History:  Diagnosis Date  . Back ache   . Hypotension   . Kidney stones   . Paroxysmal atrial fibrillation (HCC)    chads2vasc score is at least 1  . Syncope    due to hypotensin/ complicated by RVR with afib    Past Surgical History:  Procedure Laterality  Date  . CARDIOVERSION N/A 12/30/2014   Procedure: CARDIOVERSION;  Surgeon: Jeremy Nose, MD;  [facility];  Service: Cardiovascular;  Laterality: N/A;  . CARDIOVERSION N/A 02/16/2018   Procedure: CARDIOVERSION;  Surgeon: Jeremy Nose, MD;  Location: [facility] ENDOSCOPY;  Service: Cardiovascular;  Laterality: N/A;  . LITHOTRIPSY    . TEE WITHOUT CARDIOVERSION N/A 12/30/2014   Procedure: TRANSESOPHAGEAL ECHOCARDIOGRAM (TEE);  Surgeon: Jeremy Nose, MD;  Location[facility] ENDOSCOPY;  Service: Cardiovascular;  Laterality: N/A;    FAMHx:  Family History  Problem Relation Age of Onset  . Heart failure Mother        mitral valve disease  . Diabetes Father   . Heart failure Father        MI  . Hypertension Father   . CAD Father   . Alcoholism Father   . Cancer Sister     SOCHx:   reports that he has been smoking cigarettes.  He has been smoking about 1.00 pack per day. He has quit using smokeless tobacco. He reports that he does not drink alcohol or use drugs.  ALLERGIES:  Allergies  Allergen Reactions  . Sertraline Hcl Other (See Comments)    Extreme headaches  . Trazodone And Nefazodone Other (See Comments)    Irregular heart beat? Hypotension?    ROS: Pertinent items noted in HPI and remainder of comprehensive ROS otherwise negative.  HOME MEDS: Current Outpatient Medications  Medication Sig Dispense Refill  . apixaban (ELIQUIS) 5 MG TABS tablet Take 1 tablet (5 mg total) by mouth 2 (two) times daily. Schedule MD appt for further refills 180 tablet 1  . Ascorbic Acid (VITAMIN C) 1000 MG tablet Take 1,000 mg by mouth daily.    . benzonatate (TESSALON) 100 MG capsule Take 1 capsule (100 mg total) by mouth 2 (two) times daily as needed for cough. 20 capsule 0  . clonazePAM (KLONOPIN) 1 MG tablet Take 1 tablet (1 mg total) by mouth 2 (two) times daily as needed. for anxiety 60 tablet 3  . Garlic 1000 MG CAPS Take 1,000 mg by mouth daily.     . Hypromellose (ARTIFICIAL  TEARS OP) Place 1 drop into both eyes daily as needed (for dry eyes).    [name] ibuprofen (ADVIL,MOTRIN) 200 MG tablet Take 800 mg by mouth every 6 (six) hours as needed for moderate pain (pain).    . Melatonin 1 MG TABS Take 2 mg by mouth at  bedtime.     . metoprolol tartrate (LOPRESSOR) 25 MG tablet Take 0.5 tablets (12.5 mg total) by mouth 2 (two) times daily. 90 tablet 1  . multivitamin (ONE-A-DAY MEN'S) TABS tablet Take 1 tablet by mouth daily.    . Omega-3 Fatty Acids (FISH OIL) 1000 MG CAPS Take 1,000 mg by mouth 2 (two) times daily.     [name] triamcinolone cream (KENALOG) 0.1 % Apply 1 application topically 2 (two) times daily. 30 g 0  . venlafaxine XR (EFFEXOR-XR) 150 MG 24 hr capsule TAKE 1 CAPSULE(150 MG) BY MOUTH DAILY WITH BREAKFAST 90 capsule 2   No current facility-administered medications for this visit.     LABS/IMAGING: No results found for this or any previous visit (from the past 48 hour(s)). No results found.  VITALS: BP 116/78   Pulse 94   Ht 6' (1.829 m)   Wt 220 lb 3.2 oz (99.9 kg)   BMI 29.86 kg/m   EXAM: General appearance: alert, fatigued and no distress Neck: no carotid bruit, no JVD and thyroid not enlarged, symmetric, no tenderness/mass/nodules Lungs: clear to auscultation bilaterally Heart: regular rate and rhythm, S1, S2 normal, no murmur, click, rub or gallop Abdomen: soft, non-tender; bowel sounds normal; no masses,  no organomegaly Extremities: extremities normal, atraumatic, no cyanosis or edema Pulses: 2+ and symmetric Skin: Skin color, texture, turgor normal. No rashes or lesions Psych: Anxious  EKG: Deferred  ASSESSMENT: 1. Abnormal Myoview stress test-LVEF 49%, mild inferior ischemia (02/2018) 2. Recurrent atrial fibrillation -  on Multaq with recent cardioversion (CHADSVASC 2) 3. RBBB 4. Normal LV function by recent echocardiogram 5. Remote syncopal episodes 6. History of bradycardia with pauses 7. Fatigue, non-restorative sleep  PLAN: 1.    Jeremy King was found to have mild inferior ischemia on the stress test with an EF of 49%.  It is possible this could be artifactual however since he has some recurrent A. fib and were considering antiarrhythmic therapy, possibly with a class Ic agent, I would recommend definitive heart catheterization to exclude any coronary disease.  I discussed the risk, benefits and alternatives with him, including noninvasive imaging with CT coronary angiography or perhaps keeping him in A. fib and pursuing anticoagulation and rate control rather than rhythm control.  Since he felt symptomatic with his A. fib and did feel better after cardioversion, I would recommend we continue to try to get him back into rhythm.  For that, we do need to exclude coronary disease.  He is agreeable to cardiac catheterization and will plan follow-up to review antiarrhythmic therapy if his catheterization is negative for ischemia.  Jeremy Nose, MD, [facility], FACP  [place]  [facility]  Medical Director of the Advanced Lipid Disorders &  Cardiovascular Risk Reduction Clinic Diplomate of the American Board of Clinical Lipidology Attending Cardiologist  Direct Dial: [phone]  Fax: [phone]  Website:  [name] Xavia Kniskern 03/09/2018, 4:09 PM

## 2018-03-09 NOTE — Patient Instructions (Addendum)
   [facility] [address] [place] [address] Dept: [phone] Loc: [phone]  Jeremy MoraleKimon R King  03/09/2018  You are scheduled for a Cardiac Catheterization on Thursday, June 6 with Dr. Verdis PrimeHenry King.  1. Please [facility] A[facility] at [facility]: [address] [place], [place] [address] at 5:30 AM (two hours before your procedure to ensure your preparation). Free valet parking service is available.   Special note: Every effort is made to have your procedure done on time. Please understand that emergencies sometimes delay scheduled procedures.  2. Diet: Do not eat or drink anything after midnight prior to your procedure except sips of water to take medications.  3. Labs + Chest X-Ray:  -- labs TODAY -- chest x-ray Tuesday 6/4 or Wednesday 6/5 [facility]  4. Medication instructions in preparation for your procedure:  HOLD ELIQUIS for 48 hours prior to procedure  On the morning of your procedure, take Aspirin 81mg  and any morning medicines NOT listed above.  You may use sips of water.  5. Plan for one night stay--bring personal belongings. 6. Bring a current list of your medications and current insurance cards. 7. You MUST have a responsible person to drive you home. 8. Someone MUST be with you the first 24 hours after you arrive home or your discharge will be delayed. 9. Please wear clothes that are easy to get on and off and wear slip-on shoes.  Thank you for allowing us to care for you!   -- [facility]     Your physician recommends that you schedule a follow-up appointment in 2-3 weeks (after cath) with MD or NP/PA

## 2018-03-10 ENCOUNTER — Telehealth: Payer: Self-pay | Admitting: *Deleted

## 2018-03-10 ENCOUNTER — Ambulatory Visit
Admission: RE | Admit: 2018-03-10 | Discharge: 2018-03-10 | Disposition: A | Discharge status: Home / Self Care | Payer: Medicare Other | Source: Ambulatory Visit | Attending: Internal Medicine | Admitting: Internal Medicine

## 2018-03-10 ENCOUNTER — Other Ambulatory Visit: Payer: Self-pay | Admitting: *Deleted

## 2018-03-10 DIAGNOSIS — R9439 Abnormal result of other cardiovascular function study: Secondary | ICD-10-CM

## 2018-03-10 DIAGNOSIS — I4891 Unspecified atrial fibrillation: Secondary | ICD-10-CM

## 2018-03-10 DIAGNOSIS — Z01818 Encounter for other preprocedural examination: Secondary | ICD-10-CM

## 2018-03-10 LAB — CBC
Hematocrit: 43.1 % (ref 37.5–51.0)
Hemoglobin: 14.8 g/dL (ref 13.0–17.7)
MCH: 32.5 pg (ref 26.6–33.0)
MCHC: 34.3 g/dL (ref 31.5–35.7)
MCV: 95 fL (ref 79–97)
PLATELETS: 221 10*3/uL (ref 150–450)
RBC: 4.55 x10E6/uL (ref 4.14–5.80)
RDW: 13.4 % (ref 12.3–15.4)
WBC: 7.1 10*3/uL (ref 3.4–10.8)

## 2018-03-10 LAB — TSH: TSH: 0.724 u[IU]/mL (ref 0.450–4.500)

## 2018-03-10 LAB — BASIC METABOLIC PANEL
BUN / CREAT RATIO: 18 (ref 10–24)
BUN: 17 mg/dL (ref 8–27)
CHLORIDE: 104 mmol/L (ref 96–106)
CO2: 24 mmol/L (ref 20–29)
Calcium: 9.5 mg/dL (ref 8.6–10.2)
Creatinine, Ser: 0.94 mg/dL (ref 0.76–1.27)
GFR calc non Af Amer: 83 mL/min/{1.73_m2} (ref >59)
GFR, EST AFRICAN AMERICAN: 96 mL/min/{1.73_m2} (ref >59)
GLUCOSE: 86 mg/dL (ref 65–99)
POTASSIUM: 3.8 mmol/L (ref 3.5–5.2)
SODIUM: 143 mmol/L (ref 134–144)

## 2018-03-10 LAB — PROTIME-INR
INR: 1.1 (ref 0.8–1.2)
Prothrombin Time: 10.9 s (ref 9.1–12.0)

## 2018-03-10 NOTE — Telephone Encounter (Addendum)
[facility] for: Thursday March 12, 2018 7:30 AM Verify arrival time and place: [facility] Main Entrance A at: 5:30 AM  No solid food after midnight prior to cath, clear liquids until 5 AM day of procedure. Verify allergies in Epic Verify no diabetes medications.  Hold: Apixaban 48 hours pre procedure and day of procedure-last dose was 03/09/18 PM.  Except hold medications AM meds can be  taken pre-cath with sip of water including: ASA 81 mg   Confirm patient has responsible person to drive home post procedure and observe patient for 24 hours-yes  Discussed instructions with patient, he verbalized understanding, thanked me for call.

## 2018-03-11 NOTE — Telephone Encounter (Signed)
LMTCB to discuss instructions with patient 

## 2018-03-12 ENCOUNTER — Ambulatory Visit (HOSPITAL_COMMUNITY)
Admission: RE | Disposition: A | Discharge status: Home / Self Care | Payer: Self-pay | Source: Ambulatory Visit | Attending: Interventional Cardiology

## 2018-03-12 ENCOUNTER — Encounter (HOSPITAL_COMMUNITY): Payer: Self-pay | Admitting: Interventional Cardiology

## 2018-03-12 ENCOUNTER — Ambulatory Visit (HOSPITAL_COMMUNITY)
Admission: RE | Admit: 2018-03-12 | Discharge: 2018-03-12 | Disposition: A | Discharge status: Home / Self Care | Payer: Medicare Other | Source: Ambulatory Visit | Attending: Interventional Cardiology | Admitting: Interventional Cardiology

## 2018-03-12 DIAGNOSIS — I4819 Other persistent atrial fibrillation: Secondary | ICD-10-CM | POA: Diagnosis present

## 2018-03-12 DIAGNOSIS — I5022 Chronic systolic (congestive) heart failure: Secondary | ICD-10-CM

## 2018-03-12 DIAGNOSIS — E785 Hyperlipidemia, unspecified: Secondary | ICD-10-CM | POA: Diagnosis present

## 2018-03-12 DIAGNOSIS — Z7901 Long term (current) use of anticoagulants: Secondary | ICD-10-CM | POA: Diagnosis not present

## 2018-03-12 DIAGNOSIS — Z87442 Personal history of urinary calculi: Secondary | ICD-10-CM | POA: Insufficient documentation

## 2018-03-12 DIAGNOSIS — I481 Persistent atrial fibrillation: Secondary | ICD-10-CM | POA: Insufficient documentation

## 2018-03-12 DIAGNOSIS — R001 Bradycardia, unspecified: Secondary | ICD-10-CM | POA: Diagnosis not present

## 2018-03-12 DIAGNOSIS — I5042 Chronic combined systolic (congestive) and diastolic (congestive) heart failure: Secondary | ICD-10-CM | POA: Insufficient documentation

## 2018-03-12 DIAGNOSIS — F419 Anxiety disorder, unspecified: Secondary | ICD-10-CM | POA: Diagnosis not present

## 2018-03-12 DIAGNOSIS — I4510 Unspecified right bundle-branch block: Secondary | ICD-10-CM | POA: Insufficient documentation

## 2018-03-12 DIAGNOSIS — I2584 Coronary atherosclerosis due to calcified coronary lesion: Secondary | ICD-10-CM | POA: Insufficient documentation

## 2018-03-12 DIAGNOSIS — I4891 Unspecified atrial fibrillation: Secondary | ICD-10-CM

## 2018-03-12 DIAGNOSIS — I110 Hypertensive heart disease with heart failure: Secondary | ICD-10-CM | POA: Insufficient documentation

## 2018-03-12 DIAGNOSIS — F17210 Nicotine dependence, cigarettes, uncomplicated: Secondary | ICD-10-CM | POA: Diagnosis not present

## 2018-03-12 DIAGNOSIS — I2510 Atherosclerotic heart disease of native coronary artery without angina pectoris: Secondary | ICD-10-CM | POA: Diagnosis not present

## 2018-03-12 DIAGNOSIS — R931 Abnormal findings on diagnostic imaging of heart and coronary circulation: Secondary | ICD-10-CM | POA: Diagnosis present

## 2018-03-12 DIAGNOSIS — Z8249 Family history of ischemic heart disease and other diseases of the circulatory system: Secondary | ICD-10-CM | POA: Insufficient documentation

## 2018-03-12 DIAGNOSIS — I951 Orthostatic hypotension: Secondary | ICD-10-CM | POA: Diagnosis not present

## 2018-03-12 DIAGNOSIS — I480 Paroxysmal atrial fibrillation: Secondary | ICD-10-CM | POA: Insufficient documentation

## 2018-03-12 DIAGNOSIS — R9439 Abnormal result of other cardiovascular function study: Secondary | ICD-10-CM

## 2018-03-12 DIAGNOSIS — Z79899 Other long term (current) drug therapy: Secondary | ICD-10-CM | POA: Diagnosis not present

## 2018-03-12 DIAGNOSIS — Z888 Allergy status to other drugs, medicaments and biological substances status: Secondary | ICD-10-CM | POA: Diagnosis not present

## 2018-03-12 DIAGNOSIS — Z9889 Other specified postprocedural states: Secondary | ICD-10-CM | POA: Diagnosis not present

## 2018-03-12 HISTORY — PX: LEFT HEART CATH AND CORONARY ANGIOGRAPHY: CATH118249

## 2018-03-12 SURGERY — LEFT HEART CATH AND CORONARY ANGIOGRAPHY
Anesthesia: LOCAL

## 2018-03-12 MED ORDER — IOPAMIDOL (ISOVUE-370) INJECTION 76%
INTRAVENOUS | Status: DC | PRN
  Administered 2018-03-12: 95 mL via INTRA_ARTERIAL

## 2018-03-12 MED ORDER — LIDOCAINE HCL (PF) 1 % IJ SOLN
INTRAMUSCULAR | Status: AC
  Filled 2018-03-12: qty 30

## 2018-03-12 MED ORDER — ACETAMINOPHEN 325 MG PO TABS: 650.0000 mg | ORAL_TABLET | ORAL | Status: DC | PRN

## 2018-03-12 MED ORDER — HEPARIN (PORCINE) IN NACL 2-0.9 UNITS/ML
INTRAMUSCULAR | Status: DC | PRN
  Administered 2018-03-12: 08:00:00 via INTRA_ARTERIAL

## 2018-03-12 MED ORDER — HEPARIN SODIUM (PORCINE) 1000 UNIT/ML IJ SOLN
INTRAMUSCULAR | Status: DC | PRN
  Administered 2018-03-12: 5000 [IU] via INTRAVENOUS

## 2018-03-12 MED ORDER — SODIUM CHLORIDE 0.9% FLUSH: 3.0000 mL | INTRAVENOUS | Status: DC | PRN

## 2018-03-12 MED ORDER — HEPARIN (PORCINE) IN NACL 2-0.9 UNITS/ML
INTRAMUSCULAR | Status: AC | PRN
  Administered 2018-03-12 (×2): 500 mL via INTRA_ARTERIAL

## 2018-03-12 MED ORDER — HEPARIN SODIUM (PORCINE) 1000 UNIT/ML IJ SOLN
INTRAMUSCULAR | Status: AC
  Filled 2018-03-12: qty 1

## 2018-03-12 MED ORDER — ONDANSETRON HCL 4 MG/2ML IJ SOLN: 4.0000 mg | Freq: Four times a day (QID) | INTRAMUSCULAR | Status: DC | PRN

## 2018-03-12 MED ORDER — HEPARIN (PORCINE) IN NACL 1000-0.9 UT/500ML-% IV SOLN
INTRAVENOUS | Status: AC
  Filled 2018-03-12: qty 1000

## 2018-03-12 MED ORDER — SODIUM CHLORIDE 0.9 % IV SOLN
INTRAVENOUS | Status: DC
  Administered 2018-03-12: 06:00:00 via INTRAVENOUS

## 2018-03-12 MED ORDER — SODIUM CHLORIDE 0.9 % IV SOLN: 250.0000 mL | INTRAVENOUS | Status: DC | PRN

## 2018-03-12 MED ORDER — VERAPAMIL HCL 2.5 MG/ML IV SOLN
INTRAVENOUS | Status: AC
  Filled 2018-03-12: qty 2

## 2018-03-12 MED ORDER — SODIUM CHLORIDE 0.9% FLUSH: 3.0000 mL | Freq: Two times a day (BID) | INTRAVENOUS | Status: DC

## 2018-03-12 MED ORDER — IOPAMIDOL (ISOVUE-370) INJECTION 76%
INTRAVENOUS | Status: AC
  Filled 2018-03-12: qty 100

## 2018-03-12 MED ORDER — OXYCODONE HCL 5 MG PO TABS: 5.0000 mg | ORAL_TABLET | ORAL | Status: DC | PRN

## 2018-03-12 MED ORDER — FENTANYL CITRATE (PF) 100 MCG/2ML IJ SOLN
INTRAMUSCULAR | Status: AC
Start: 2018-03-12 — End: ?
  Filled 2018-03-12: qty 2

## 2018-03-12 MED ORDER — MIDAZOLAM HCL 2 MG/2ML IJ SOLN
INTRAMUSCULAR | Status: AC
  Filled 2018-03-12: qty 2

## 2018-03-12 MED ORDER — LIDOCAINE HCL (PF) 1 % IJ SOLN
INTRAMUSCULAR | Status: DC | PRN
  Administered 2018-03-12: 2 mL

## 2018-03-12 MED ORDER — MIDAZOLAM HCL 2 MG/2ML IJ SOLN
INTRAMUSCULAR | Status: DC | PRN
  Administered 2018-03-12 (×2): 1 mg via INTRAVENOUS

## 2018-03-12 MED ORDER — SODIUM CHLORIDE 0.9 % IV SOLN: INTRAVENOUS | Status: DC

## 2018-03-12 MED ORDER — FENTANYL CITRATE (PF) 100 MCG/2ML IJ SOLN
INTRAMUSCULAR | Status: DC | PRN
  Administered 2018-03-12: 50 ug via INTRAVENOUS

## 2018-03-12 MED ORDER — ASPIRIN 81 MG PO CHEW: 81.0000 mg | CHEWABLE_TABLET | ORAL | Status: AC

## 2018-03-12 SURGICAL SUPPLY — 12 items
CATH INFINITI 5 FR JL3.5 (CATHETERS) ×2 IMPLANT
CATH INFINITI JR4 5F (CATHETERS) ×2 IMPLANT
COVER PRB 48X5XTLSCP FOLD TPE (BAG) ×1 IMPLANT
COVER PROBE 5X48 (BAG) ×1
DEVICE RAD COMP TR BAND LRG (VASCULAR PRODUCTS) ×2 IMPLANT
GLIDESHEATH SLEND A-KIT 6F 22G (SHEATH) ×2 IMPLANT
GUIDEWIRE INQWIRE 1.5J.035X260 (WIRE) ×1 IMPLANT
INQWIRE 1.5J .035X260CM (WIRE) ×2
KIT HEART LEFT (KITS) ×2 IMPLANT
PACK CARDIAC CATHETERIZATION (CUSTOM PROCEDURE TRAY) ×2 IMPLANT
TRANSDUCER W/STOPCOCK (MISCELLANEOUS) ×2 IMPLANT
TUBING CIL FLEX 10 FLL-RA (TUBING) ×2 IMPLANT

## 2018-03-12 NOTE — Interval H&P Note (Signed)
Cath Lab Visit (complete for each Cath Lab visit)  Clinical Evaluation Leading to the Procedure:   ACS: No.  Non-ACS:    Anginal Classification: CCS III  Anti-ischemic medical therapy: No Therapy  Non-Invasive Test Results: No non-invasive testing performed  Prior CABG: No previous CABG      History and Physical Interval Note:  03/12/2018 7:45 AM  Jeremy King  has presented today for surgery, with the diagnosis of afib - as  The various methods of treatment have been discussed with the patient and family. After consideration of risks, benefits and other options for treatment, the patient has consented to  Procedure(s): LEFT HEART CATH AND CORONARY ANGIOGRAPHY (N/A) as a surgical intervention .  The patient's history has been reviewed, patient examined, no change in status, stable for surgery.  I have reviewed the patient's chart and labs.  Questions were answered to the patient's satisfaction.     Lyn RecordsHenry W Kerriann Kamphuis III

## 2018-03-12 NOTE — Discharge Instructions (Signed)
**Note Jeriah Corkum-identified via Obfuscation** Radial Site Care °Refer to this sheet in the next few weeks. These instructions provide you with information about caring for yourself after your procedure. Your health care provider may also give you more specific instructions. Your treatment has been planned according to current medical practices, but problems sometimes occur. Call your health care provider if you have any problems or questions after your procedure. °What can I expect after the procedure? °After your procedure, it is typical to have the following: °· Bruising at the radial site that usually fades within 1-2 weeks. °· Blood collecting in the tissue (hematoma) that may be painful to the touch. It should usually decrease in size and tenderness within 1-2 weeks. ° °Follow these instructions at home: °· Take medicines only as directed by your health care provider. °· You may shower 24-48 hours after the procedure or as directed by your health care provider. Remove the bandage (dressing) and gently wash the site with plain soap and water. Pat the area dry with a clean towel. Do not rub the site, because this may cause bleeding. °· Do not take baths, swim, or use a hot tub until your health care provider approves. °· Check your insertion site every day for redness, swelling, or drainage. °· Do not apply powder or lotion to the site. °· Do not flex or bend the affected arm for 24 hours or as directed by your health care provider. °· Do not push or pull heavy objects with the affected arm for 24 hours or as directed by your health care provider. °· Do not lift over 10 lb (4.5 kg) for 5 days after your procedure or as directed by your health care provider. °· Ask your health care provider when it is okay to: °? Return to work or school. °? Resume usual physical activities or sports. °? Resume sexual activity. °· Do not drive home if you are discharged the same day as the procedure. Have someone else drive you. °· You may drive 24 hours after the procedure  unless otherwise instructed by your health care provider. °· Do not operate machinery or power tools for 24 hours after the procedure. °· If your procedure was done as an outpatient procedure, which means that you went home the same day as your procedure, a responsible adult should be with you for the first 24 hours after you arrive home. °· Keep all follow-up visits as directed by your health care provider. This is important. °Contact a health care provider if: °· You have a fever. °· You have chills. °· You have increased bleeding from the radial site. Hold pressure on the site. °Get help right away if: °· You have unusual pain at the radial site. °· You have redness, warmth, or swelling at the radial site. °· You have drainage (other than a small amount of blood on the dressing) from the radial site. °· The radial site is bleeding, and the bleeding does not stop after 30 minutes of holding steady pressure on the site. °· Your arm or hand becomes pale, cool, tingly, or numb. °This information is not intended to replace advice given to you by your health care provider. Make sure you discuss any questions you have with your health care provider. °Document Released: 10/26/2010 Document Revised: 02/29/2016 Document Reviewed: 04/11/2014 °Elsevier Interactive Patient Education © 2018 [employer] ° °

## 2018-03-16 MED FILL — Heparin Sod (Porcine)-NaCl IV Soln 1000 Unit/500ML-0.9%: INTRAVENOUS | Qty: 1000 | Status: AC

## 2018-03-19 ENCOUNTER — Encounter: Payer: Self-pay | Admitting: Cardiothoracic Surgery

## 2018-03-19 ENCOUNTER — Other Ambulatory Visit: Payer: Self-pay

## 2018-03-19 ENCOUNTER — Institutional Professional Consult (permissible substitution): Payer: Medicare Other | Admitting: Cardiothoracic Surgery

## 2018-03-19 VITALS — BP 117/91 | HR 82 | Resp 16 | Ht 72.0 in | Wt 220.0 lb

## 2018-03-19 DIAGNOSIS — I2510 Atherosclerotic heart disease of native coronary artery without angina pectoris: Secondary | ICD-10-CM | POA: Diagnosis not present

## 2018-03-19 DIAGNOSIS — I4819 Other persistent atrial fibrillation: Secondary | ICD-10-CM

## 2018-03-19 DIAGNOSIS — I5022 Chronic systolic (congestive) heart failure: Secondary | ICD-10-CM | POA: Diagnosis not present

## 2018-03-19 DIAGNOSIS — I481 Persistent atrial fibrillation: Secondary | ICD-10-CM

## 2018-03-19 NOTE — Progress Notes (Signed)
PCP is Myrlene Brokerrawford, Elizabeth A, MD Referring Provider is Lyn RecordsSmith, Henry W, MD  Chief Complaint  Patient presents with  . Coronary Artery Disease    eval for CABG...CATH 03/12/18   Patient examined, images from coronary angiogram and transthoracic echocardiogram personally reviewed and counseled with patient   HPI: 67 year old nondiabetic smoker presents after recently being diagnosed with severe multivessel coronary artery disease with mild-moderate LV dysfunction.  Patient has been followed by cardiology for the past 3 years for persistent atrial fibrillation.  He has had at least 2 cardioversions but remains in chronic atrial fibrillation now on Eliquis and metoprolol.  Echocardiogram shows mild hypokinesia EF 45% normal LVEDP at cath with tight proximal RCA stenosis [dominant] and tight proximal LAD and tight ostial diagonal stenosis.  His stress test was positive preceding the cardiac cath.  His cardiologist has recommended multivessel bypass surgery with combined left atrial Maze procedure and left atrial clipping. Patient smokes 1 pack/day. He has hypertension and positive family history of CAD.  His father died from an MI.  He denies previous stroke, DVT, varicose veins, claudication, TIA.  He has had previous surgery for kidney stones with general anesthesia which he tolerated well.  No bleeding complications of the Eliquis.  Patient is retired and lives alone but has very close neighbors  Past Medical History:  Diagnosis Date  . Back ache   . Hypotension   . Kidney stones   . Paroxysmal atrial fibrillation (HCC)    chads2vasc score is at least 1  . Syncope    due to hypotensin/ complicated by RVR with afib    Past Surgical History:  Procedure Laterality Date  . CARDIOVERSION N/A 12/30/2014   Procedure: CARDIOVERSION;  Surgeon: Chrystie NoseKenneth C Hilty, MD;  [facility];  Service: Cardiovascular;  Laterality: N/A;  . CARDIOVERSION N/A 02/16/2018   Procedure: CARDIOVERSION;   Surgeon: Chrystie NoseHilty, Kenneth C, MD;  [facility];  Service: Cardiovascular;  Laterality: N/A;  . LEFT HEART CATH AND CORONARY ANGIOGRAPHY N/A 03/12/2018   Procedure: LEFT HEART CATH AND CORONARY ANGIOGRAPHY;  Surgeon: Lyn RecordsSmith, Henry W, MD;  Location: [facility] INVASIVE CV LAB;  Service: Cardiovascular;  Laterality: N/A;  . LITHOTRIPSY    . TEE WITHOUT CARDIOVERSION N/A 12/30/2014   Procedure: TRANSESOPHAGEAL ECHOCARDIOGRAM (TEE);  Surgeon: Chrystie NoseKenneth C Hilty, MD;  [facility];  Service: Cardiovascular;  Laterality: N/A;    Family History  Problem Relation Age of Onset  . Heart failure Mother        mitral valve disease  . Diabetes Father   . Heart failure Father        MI  . Hypertension Father   . CAD Father   . Alcoholism Father   . Cancer Sister     Social History Social History   Tobacco Use  . Smoking status: Current Every Day Smoker    Packs/day: 1.00    Types: Cigarettes  . Smokeless tobacco: Former NeurosurgeonUser  . Tobacco comment: approx 1ppd - maybe a little less (04/18/15)  Substance Use Topics  . Alcohol use: No    Alcohol/week: 0.0 oz  . Drug use: No    Current Outpatient Medications  Medication Sig Dispense Refill  . apixaban (ELIQUIS) 5 MG TABS tablet Take 1 tablet (5 mg total) by mouth 2 (two) times daily. Schedule MD appt for further refills 180 tablet 1  . Ascorbic Acid (VITAMIN C) 1000 MG tablet Take 1,000 mg by mouth daily.    . clonazePAM (KLONOPIN) 1 MG  tablet Take 1 tablet (1 mg total) by mouth 2 (two) times daily as needed. for anxiety (Patient taking differently: Take 1 mg by mouth 2 (two) times daily. ) 60 tablet 3  . Garlic 1000 MG CAPS Take 1,000 mg by mouth daily.     . Hypromellose (ARTIFICIAL TEARS OP) Place 1 drop into both eyes daily as needed (for dry eyes).    [name] ibuprofen (ADVIL,MOTRIN) 200 MG tablet Take 800 mg by mouth every 6 (six) hours as needed for headache or moderate pain.     . Melatonin 1 MG TABS Take 2 mg by mouth at bedtime.     .  metoprolol tartrate (LOPRESSOR) 25 MG tablet Take 0.5 tablets (12.5 mg total) by mouth 2 (two) times daily. 90 tablet 1  . multivitamin (ONE-A-DAY MEN'S) TABS tablet Take 1 tablet by mouth daily.    . Omega-3 Fatty Acids (FISH OIL) 1000 MG CAPS Take 1,000 mg by mouth 2 (two) times daily.     [name] triamcinolone cream (KENALOG) 0.1 % Apply 1 application topically 2 (two) times daily. (Patient taking differently: Apply 1 application topically 2 (two) times daily as needed (for rough spots on ear). ) 30 g 0  . venlafaxine XR (EFFEXOR-XR) 150 MG 24 hr capsule TAKE 1 CAPSULE(150 MG) BY MOUTH DAILY WITH BREAKFAST 90 capsule 2   No current facility-administered medications for this visit.     Allergies  Allergen Reactions  . Sertraline Hcl Other (See Comments)    Extreme headaches  . Trazodone And Nefazodone Other (See Comments)    Irregular heart beat? Hypotension?    Review of Systems                    Review of Systems :  [ y ] = yes, [  ] = no        General :  Weight gain [   ]    Weight loss  [ y  ]  Fatigue Cove.Etienne  ]  Fever [  ]  Chills  [  ]                                          HEENT    Headache [  ]  Dizziness [  ]  Blurred vision [  ] Glaucoma  [  ]                          Nosebleeds [  ] Painful or loose teeth [  ]        Cardiac :  Chest pain/ pressure [  ]  Resting SOB [  ] exertional SOB Cove.Etienne  ]                        Orthopnea [  ]  Pedal edema  [  ]  Palpitations Cove.Etienne  ] Syncope/presyncope [ y]                        Paroxysmal nocturnal dyspnea [  ]         Pulmonary : cough [  ]  wheezing [  ]  Hemoptysis [  ] Sputum [  ] Snoring [  ]  Pneumothorax [  ]  Sleep apnea [  ]        GI : Vomiting [  ]  Dysphagia [  ]  Melena  [  ]  Abdominal pain [  ] BRBPR [  ]              Heart burn [ y ]  Constipation [  ] Diarrhea  [  ] Colonoscopy [   ]        GU : Hematuria [  ]  Dysuria [  ]  Nocturia [  ] UTI's [  ]        Vascular : Claudication [  ]   Rest pain [  ]  DVT [  ] Vein stripping [  ] leg ulcers [  ]                          TIA [  ] Stroke [  ]  Varicose veins [  ]        NEURO :  Headaches  [  ] Seizures [  ] Vision changes [  ] Paresthesias [  ]                                               Musculoskeletal :  Arthritis [  ] Gout  [  ]  Back pain [  ]  Joint pain [  ]        Skin :  Rash [  ]  Melanoma [  ] Sores [  ]        Heme : Bleeding problems [  ]Clotting Disorders [  ] Anemia [  ]Blood Transfusion [ ]         Endocrine : Diabetes [  ] Heat or Cold intolerance [  ] Polyuria [  ]excessive thirst [ ]         Psych : Depression [  ]  Anxiety [  ]  Psych hospitalizations [  ] Memory change [  ]       Right-hand-dominant Edentulous Distant history of crush injury to his chest with right-sided broken ribs and fractured right clavicle but no pneumothorax History of orthostatic dizziness possibly related to his A. fib now much better that he is not taking flomax and trazodone                                                                 BP (!) 117/91 (BP Location: Right Arm, Patient Position: Sitting, Cuff Size: Large)   Pulse 82   Resp 16   Ht 6' (1.829 m)   Wt 220 lb (99.8 kg)   SpO2 98% Comment: ON RA  BMI 29.84 kg/m  Physical Exam      Exam    General- alert and comfortable    Neck- no JVD, no cervical adenopathy palpable, no carotid bruit   Lungs- clear without rales, wheezes   Cor- regular rate and rhythm, no murmur , gallop   Abdomen- soft, non-tender   Extremities - warm, non-tender, minimal edema   Neuro-  oriented, appropriate, no focal weakness   Diagnostic Tests: Angiograms reviewed showing significant blockage with adequate targets of the posterior descending, LAD, first diagonal, second diagonal.  No significant valve disease on echocardiogram.  EF 45%  Impression: Three-vessel CAD with mild-moderate LV dysfunction History of persistent atrial fibrillation History of orthostatic  dizziness  Plan: Patient be scheduled for multivessel CABG with combined Maze procedure on July 1 at [facility].  He will stop taking his Eliquis on June 21.  He will stop taking his omega-3 fish all tablets now.  I discussed the expected benefits of the surgery as well as the alternatives and risks and he demonstrates understanding and agrees to proceed with the plan.  Mikey Bussing, MD [facility] [phone]

## 2018-03-20 ENCOUNTER — Telehealth: Payer: Self-pay | Admitting: Physician Assistant

## 2018-03-20 ENCOUNTER — Other Ambulatory Visit: Payer: Self-pay | Admitting: *Deleted

## 2018-03-20 DIAGNOSIS — I2510 Atherosclerotic heart disease of native coronary artery without angina pectoris: Secondary | ICD-10-CM

## 2018-03-20 DIAGNOSIS — I4819 Other persistent atrial fibrillation: Secondary | ICD-10-CM

## 2018-03-20 NOTE — Telephone Encounter (Signed)
Patient called because he wants to cancel lab work on 6/20.  I will counsel lab work and route this to the schedulers so they can call him to reschedule.  Theodore Demarkhonda Barrett, PA-C 03/20/2018 2:11 PM Beeper [phone]

## 2018-03-25 ENCOUNTER — Other Ambulatory Visit: Payer: Self-pay | Admitting: Internal Medicine

## 2018-03-25 NOTE — Telephone Encounter (Signed)
Rx request sent to pharmacy.  

## 2018-03-26 ENCOUNTER — Ambulatory Visit: Payer: Medicare Other | Admitting: Physician Assistant

## 2018-04-01 NOTE — Pre-Procedure Instructions (Signed)
Jeremy King  04/01/2018      [facility] [phone] [name], [place] - [address] AT [facility] [address] [place] [id number] Phone: [phone] Fax: [phone]    Your procedure is scheduled on April 06, 2018.  [facility] at 530 AM.  Call this number if you have problems the morning of surgery:  [phone]   Remember:  Do not eat or drink after midnight.    Take these medicines the morning of surgery with A SIP OF WATER  Eye drops Metoprolol tartrate (lopressor) Venlafaxine XR (effexor)  Follow your surgeon's instructions on when to hold/resume Eliquis  7 days prior to surgery STOP taking any Aleve, Naproxen, Ibuprofen, Motrin, Advil, Goody's, BC's, all herbal medications, fish oil, and all vitamins    Do not wear jewelry  Do not wear lotions, powders, or colognes, or deodorant.  Men may shave face and neck.  Do not bring valuables to the hospital.  [facility] is not responsible for any belongings or valuables.  Contacts, dentures or bridgework may not be worn into surgery.  Leave your suitcase in the car.  After surgery it may be brought to your room.  For patients admitted to the hospital, discharge time will be determined by your treatment team.  Patients discharged the day of surgery will not be allowed to drive home.    [facility]- Preparing For Surgery  Before surgery, you can play an important role. Because skin is not sterile, your skin needs to be as free of germs as possible. You can reduce the number of germs on your skin by washing with CHG (chlorahexidine gluconate) Soap before surgery.  CHG is an antiseptic cleaner which kills germs and bonds with the skin to continue killing germs even after washing.    Oral Hygiene is also important to reduce your risk of infection.  Remember - BRUSH YOUR TEETH THE MORNING OF SURGERY WITH YOUR REGULAR TOOTHPASTE  Please do not  use if you have an allergy to CHG or antibacterial soaps. If your skin becomes reddened/irritated stop using the CHG.  Do not shave (including legs and underarms) for at least 48 hours prior to first CHG shower. It is OK to shave your face.  Please follow these instructions carefully.   1. Shower the NIGHT BEFORE SURGERY and the MORNING OF SURGERY with CHG.   2. If you chose to wash your hair, wash your hair first as usual with your normal shampoo.  3. After you shampoo, rinse your hair and body thoroughly to remove the shampoo.  4. Use CHG as you would any other liquid soap. You can apply CHG directly to the skin and wash gently with a scrungie or a clean washcloth.   5. Apply the CHG Soap to your body ONLY FROM THE NECK DOWN.  Do not use on open wounds or open sores. Avoid contact with your eyes, ears, mouth and genitals (private parts). Wash Face and genitals (private parts)  with your normal soap.  6. Wash thoroughly, paying special attention to the area where your surgery will be performed.  7. Thoroughly rinse your body with warm water from the neck down.  8. DO NOT shower/wash with your normal soap after using and rinsing off the CHG Soap.  9. Pat yourself dry with a CLEAN TOWEL.  10. Wear CLEAN PAJAMAS to bed the night before surgery,  wear comfortable clothes the morning of surgery  11. Place CLEAN SHEETS on your bed the night of your first shower and DO NOT SLEEP WITH PETS.  Day of Surgery:  Do not apply any deodorants/lotions.  Please wear clean clothes to the hospital/surgery center.   Remember to brush your teeth WITH YOUR REGULAR TOOTHPASTE.  Please read over the following fact sheets that you were given. Pain Booklet, Coughing and Deep Breathing, MRSA Information and Surgical Site Infection Prevention

## 2018-04-02 ENCOUNTER — Encounter (HOSPITAL_COMMUNITY): Payer: Self-pay

## 2018-04-02 ENCOUNTER — Other Ambulatory Visit: Payer: Self-pay

## 2018-04-02 ENCOUNTER — Ambulatory Visit (HOSPITAL_BASED_OUTPATIENT_CLINIC_OR_DEPARTMENT_OTHER)
Admission: RE | Admit: 2018-04-02 | Discharge: 2018-04-02 | Disposition: A | Discharge status: Home / Self Care | Payer: Medicare Other | Source: Ambulatory Visit | Attending: Cardiothoracic Surgery | Admitting: Cardiothoracic Surgery

## 2018-04-02 ENCOUNTER — Ambulatory Visit (HOSPITAL_COMMUNITY)
Admission: RE | Admit: 2018-04-02 | Discharge: 2018-04-02 | Disposition: A | Discharge status: Home / Self Care | Payer: Medicare Other | Source: Ambulatory Visit | Attending: Cardiothoracic Surgery | Admitting: Cardiothoracic Surgery

## 2018-04-02 ENCOUNTER — Encounter (HOSPITAL_COMMUNITY)
Admission: RE | Admit: 2018-04-02 | Discharge: 2018-04-02 | Disposition: A | Discharge status: Home / Self Care | Payer: Medicare Other | Source: Ambulatory Visit | Attending: Cardiothoracic Surgery | Admitting: Cardiothoracic Surgery

## 2018-04-02 DIAGNOSIS — I4510 Unspecified right bundle-branch block: Secondary | ICD-10-CM | POA: Diagnosis not present

## 2018-04-02 DIAGNOSIS — I481 Persistent atrial fibrillation: Secondary | ICD-10-CM | POA: Insufficient documentation

## 2018-04-02 DIAGNOSIS — I2510 Atherosclerotic heart disease of native coronary artery without angina pectoris: Secondary | ICD-10-CM | POA: Diagnosis not present

## 2018-04-02 DIAGNOSIS — Z01818 Encounter for other preprocedural examination: Secondary | ICD-10-CM | POA: Diagnosis not present

## 2018-04-02 DIAGNOSIS — I4819 Other persistent atrial fibrillation: Secondary | ICD-10-CM

## 2018-04-02 DIAGNOSIS — Z951 Presence of aortocoronary bypass graft: Secondary | ICD-10-CM | POA: Diagnosis not present

## 2018-04-02 HISTORY — DX: Personal history of urinary calculi: Z87.442

## 2018-04-02 HISTORY — DX: Anxiety disorder, unspecified: F41.9

## 2018-04-02 HISTORY — DX: Atherosclerotic heart disease of native coronary artery without angina pectoris: I25.10

## 2018-04-02 LAB — URINALYSIS, ROUTINE W REFLEX MICROSCOPIC
Bacteria, UA: NONE SEEN
Bilirubin Urine: NEGATIVE
Glucose, UA: NEGATIVE mg/dL
Hgb urine dipstick: NEGATIVE
Ketones, ur: NEGATIVE mg/dL
Leukocytes, UA: NEGATIVE
Nitrite: NEGATIVE
Protein, ur: 30 mg/dL — AB
Specific Gravity, Urine: 1.024 (ref 1.005–1.030)
pH: 5 (ref 5.0–8.0)

## 2018-04-02 LAB — COMPREHENSIVE METABOLIC PANEL
ALT: 18 U/L (ref 0–44)
AST: 18 U/L (ref 15–41)
Albumin: 3.8 g/dL (ref 3.5–5.0)
Alkaline Phosphatase: 53 U/L (ref 38–126)
Anion gap: 11 (ref 5–15)
BUN: 15 mg/dL (ref 8–23)
CO2: 20 mmol/L — ABNORMAL LOW (ref 22–32)
Calcium: 9.4 mg/dL (ref 8.9–10.3)
Chloride: 110 mmol/L (ref 98–111)
Creatinine, Ser: 0.92 mg/dL (ref 0.61–1.24)
GFR calc Af Amer: >60 mL/min (ref >60)
GFR calc non Af Amer: >60 mL/min (ref >60)
Glucose, Bld: 95 mg/dL (ref 70–99)
Potassium: 3.8 mmol/L (ref 3.5–5.1)
Sodium: 141 mmol/L (ref 135–145)
Total Bilirubin: 1 mg/dL (ref 0.3–1.2)
Total Protein: 6.4 g/dL — ABNORMAL LOW (ref 6.5–8.1)

## 2018-04-02 LAB — BLOOD GAS, ARTERIAL
Acid-base deficit: 0.3 mmol/L (ref 0.0–2.0)
Bicarbonate: 24 mmol/L (ref 20.0–28.0)
Drawn by: 449841
FIO2: 21
O2 Saturation: 95.9 %
Patient temperature: 98.6
pCO2 arterial: 40.4 mmHg (ref 32.0–48.0)
pH, Arterial: 7.391 (ref 7.350–7.450)
pO2, Arterial: 80.3 mmHg — ABNORMAL LOW (ref 83.0–108.0)

## 2018-04-02 LAB — PROTIME-INR
INR: 0.99
Prothrombin Time: 12.9 seconds (ref 11.4–15.2)

## 2018-04-02 LAB — CBC
HCT: 45.7 % (ref 39.0–52.0)
Hemoglobin: 15.1 g/dL (ref 13.0–17.0)
MCH: 32.5 pg (ref 26.0–34.0)
MCHC: 33 g/dL (ref 30.0–36.0)
MCV: 98.5 fL (ref 78.0–100.0)
Platelets: 196 10*3/uL (ref 150–400)
RBC: 4.64 MIL/uL (ref 4.22–5.81)
RDW: 12.6 % (ref 11.5–15.5)
WBC: 7.7 10*3/uL (ref 4.0–10.5)

## 2018-04-02 LAB — PULMONARY FUNCTION TEST
DL/VA % pred: 78 %
DL/VA: 3.7 ml/min/mmHg/L
DLCO cor % pred: 63 %
DLCO cor: 22.13 ml/min/mmHg
DLCO unc % pred: 64 %
DLCO unc: 22.44 ml/min/mmHg
FEF 25-75 Post: 2.8 L/sec
FEF 25-75 Pre: 2.45 L/sec
FEF2575-%Change-Post: 14 %
FEF2575-%Pred-Post: 103 %
FEF2575-%Pred-Pre: 90 %
FEV1-%Change-Post: 3 %
FEV1-%Pred-Post: 89 %
FEV1-%Pred-Pre: 87 %
FEV1-Post: 3.2 L
FEV1-Pre: 3.11 L
FEV1FVC-%Change-Post: 0 %
FEV1FVC-%Pred-Pre: 102 %
FEV6-%Change-Post: 2 %
FEV6-%Pred-Post: 89 %
FEV6-%Pred-Pre: 87 %
FEV6-Post: 4.07 L
FEV6-Pre: 3.97 L
FEV6FVC-%Change-Post: 0 %
FEV6FVC-%Pred-Post: 102 %
FEV6FVC-%Pred-Pre: 102 %
FVC-%Change-Post: 2 %
FVC-%Pred-Post: 87 %
FVC-%Pred-Pre: 84 %
FVC-Post: 4.2 L
FVC-Pre: 4.09 L
Post FEV1/FVC ratio: 76 %
Post FEV6/FVC ratio: 97 %
Pre FEV1/FVC ratio: 76 %
Pre FEV6/FVC Ratio: 97 %
RV % pred: 90 %
RV: 2.31 L
TLC % pred: 87 %
TLC: 6.48 L

## 2018-04-02 LAB — HEMOGLOBIN A1C
Hgb A1c MFr Bld: 5.3 % (ref 4.8–5.6)
Mean Plasma Glucose: 105.41 mg/dL

## 2018-04-02 LAB — SURGICAL PCR SCREEN
MRSA, PCR: NEGATIVE
Staphylococcus aureus: POSITIVE — AB

## 2018-04-02 LAB — ABO/RH: ABO/RH(D): O POS

## 2018-04-02 LAB — APTT: aPTT: 36 seconds (ref 24–36)

## 2018-04-02 MED ORDER — ALBUTEROL SULFATE (2.5 MG/3ML) 0.083% IN NEBU
2.5000 mg | INHALATION_SOLUTION | Freq: Once | RESPIRATORY_TRACT | Status: AC
  Administered 2018-04-02: 2.5 mg via RESPIRATORY_TRACT

## 2018-04-02 NOTE — Progress Notes (Signed)
Pre-op Cardiac Surgery  Carotid Findings:  Bilateral - 1% to 39% ICA stenosis. Vertebral artery flow is antegrade.  Upper Extremity Right Left  Brachial Pressures 123 Triphasic 118 Triphasic  Radial Waveforms Triphasic Triphasic  Ulnar Waveforms Triphasic Triphasic  Palmar Arch (Allen's Test) Normal Normal   Findings:  Palmar arch evaluation - Doppler waveforms remained normal bilaterally with both radial and ulnar compressions.    Lower  Extremity Right Left  Dorsalis Pedis 146 Triphasic 153 Triphasic      Posterior Tibial 149 Triphasic 151 Triphasic  Ankle/Brachial Indices 1.21 1.24    Findings:  ABIs and Doppler waveforms are within normal limits bilaterally at rest.  Toma DeitersVirginia Matti Minney, RVS 04/02/2018 1:01 PM

## 2018-04-02 NOTE — Progress Notes (Signed)
PCP is Dr. Hillard DankerElizabeth King   Cardio is Dr. Rennis GoldenHilty Has stopped his Eliquis, per Dr Donata ClayVan Trigt on 6/21 Echo 12/2014 Stress 02/2018 Cath 03/2018 PFT's 04/02/2018 Dopplers 04/02/2018

## 2018-04-04 MED ORDER — DOPAMINE-DEXTROSE 3.2-5 MG/ML-% IV SOLN
0.0000 ug/kg/min | INTRAVENOUS | Status: AC
  Administered 2018-04-06: 5 ug/kg/min via INTRAVENOUS
  Filled 2018-04-04: qty 250

## 2018-04-04 MED ORDER — TRANEXAMIC ACID (OHS) BOLUS VIA INFUSION
15.0000 mg/kg | INTRAVENOUS | Status: AC
Start: 2018-04-06 — End: 2018-04-06
  Administered 2018-04-06: 1498.5 mg via INTRAVENOUS
  Filled 2018-04-04: qty 1499

## 2018-04-04 MED ORDER — DEXMEDETOMIDINE HCL IN NACL 400 MCG/100ML IV SOLN
0.1000 ug/kg/h | INTRAVENOUS | Status: AC
  Administered 2018-04-06: .3 ug/kg/h via INTRAVENOUS
  Filled 2018-04-04: qty 100

## 2018-04-04 MED ORDER — DEXTROSE 5 % IV SOLN
0.0000 ug/min | INTRAVENOUS | Status: DC
  Filled 2018-04-04: qty 4

## 2018-04-04 MED ORDER — MAGNESIUM SULFATE 50 % IJ SOLN
40.0000 meq | INTRAMUSCULAR | Status: DC
  Filled 2018-04-04: qty 9.85

## 2018-04-04 MED ORDER — SODIUM CHLORIDE 0.9 % IV SOLN
750.0000 mg | INTRAVENOUS | Status: DC
  Filled 2018-04-04: qty 750

## 2018-04-04 MED ORDER — SODIUM CHLORIDE 0.9 % IV SOLN
INTRAVENOUS | Status: AC
  Administered 2018-04-06: .8 [IU]/h via INTRAVENOUS
  Filled 2018-04-04: qty 1

## 2018-04-04 MED ORDER — TRANEXAMIC ACID (OHS) PUMP PRIME SOLUTION
2.0000 mg/kg | INTRAVENOUS | Status: DC
Start: 2018-04-06 — End: 2018-04-06
  Filled 2018-04-04: qty 2

## 2018-04-04 MED ORDER — MILRINONE LACTATE IN DEXTROSE 20-5 MG/100ML-% IV SOLN
0.1250 ug/kg/min | INTRAVENOUS | Status: DC
Start: 2018-04-06 — End: 2018-04-06
  Filled 2018-04-04: qty 100

## 2018-04-04 MED ORDER — HEPARIN SODIUM (PORCINE) 1000 UNIT/ML IJ SOLN
INTRAMUSCULAR | Status: DC
  Filled 2018-04-04: qty 30

## 2018-04-04 MED ORDER — SODIUM CHLORIDE 0.9 % IV SOLN
30.0000 ug/min | INTRAVENOUS | Status: AC
  Administered 2018-04-06: 20 ug/min via INTRAVENOUS
  Filled 2018-04-04: qty 20

## 2018-04-04 MED ORDER — NITROGLYCERIN IN D5W 200-5 MCG/ML-% IV SOLN
2.0000 ug/min | INTRAVENOUS | Status: DC
  Filled 2018-04-04: qty 250

## 2018-04-04 MED ORDER — TRANEXAMIC ACID 1000 MG/10ML IV SOLN
1.5000 mg/kg/h | INTRAVENOUS | Status: AC
  Administered 2018-04-06: 1.5 mg/kg/h via INTRAVENOUS
  Filled 2018-04-04 (×2): qty 25

## 2018-04-04 MED ORDER — PLASMA-LYTE 148 IV SOLN
INTRAVENOUS | Status: AC
  Administered 2018-04-06: 500 mL
  Filled 2018-04-04: qty 2.5

## 2018-04-04 MED ORDER — SODIUM CHLORIDE 0.9 % IV SOLN
1.5000 g | INTRAVENOUS | Status: AC
  Administered 2018-04-06: 1.5 g via INTRAVENOUS
  Filled 2018-04-04: qty 1.5

## 2018-04-04 MED ORDER — POTASSIUM CHLORIDE 2 MEQ/ML IV SOLN
80.0000 meq | INTRAVENOUS | Status: DC
  Filled 2018-04-04: qty 40

## 2018-04-04 MED ORDER — VANCOMYCIN HCL 10 G IV SOLR
1500.0000 mg | INTRAVENOUS | Status: AC
  Administered 2018-04-06: 1500 mg via INTRAVENOUS
  Filled 2018-04-04: qty 1500

## 2018-04-05 NOTE — Anesthesia Preprocedure Evaluation (Addendum)
Anesthesia Evaluation  Patient identified by MRN, date of birth, ID band Patient awake    Reviewed: Allergy & Precautions, Patient's Chart, lab work & pertinent test results  History of Anesthesia Complications Negative for: history of anesthetic complications  Airway Mallampati: I  TM Distance: >3 FB Neck ROM: Full    Dental  (+) Edentulous Upper, Edentulous Lower   Pulmonary Current Smoker,    breath sounds clear to auscultation       Cardiovascular + CAD  + dysrhythmias Atrial Fibrillation  Rhythm:Regular  Narrative  Severe three-vessel CAD. Right dominant coronary anatomy. Moderate  diffuse calcification throughout the left coronary.  Severe proximal to mid LAD 75 to 85%. Severe obstruction in diagonal #1  Show more  and diagonal #2, both of which are graftable.  Separate ostium of the circumflex. Small territory covered. Tiny first  obtuse marginal 90%.  Severe diffuse disease proximal to mid RCA 80%.  Global left ventricular hypokinesis with EF in the 40 to 45% range.  Normal LVEDP. 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  Recent syncope of uncertain etiology.  Persistent atrial fibrillation on long-term anticoagulation therapy.      Neuro/Psych  Headaches, PSYCHIATRIC DISORDERS Anxiety    GI/Hepatic negative GI ROS, Neg liver ROS,   Endo/Other  negative endocrine ROS  Renal/GU negative Renal ROS  negative genitourinary   Musculoskeletal negative musculoskeletal ROS (+)   Abdominal Normal abdominal exam  (+)   Peds  Hematology negative hematology ROS (+)   Anesthesia Other Findings   Reproductive/Obstetrics                            Anesthesia Physical  Anesthesia Plan  ASA: IV  Anesthesia Plan: General   Post-op Pain Management:    Induction: Intravenous  PONV Risk Score and Plan: 2 and Ondansetron, Dexamethasone and  Treatment may vary due to age or medical condition  Airway Management Planned: Oral ETT  Additional Equipment: Arterial line, PA Cath, 3D TEE and Ultrasound Guidance Line Placement  Intra-op Plan:   Post-operative Plan: Post-operative intubation/ventilation  Informed Consent: I have reviewed the patients History and Physical, chart, labs and discussed the procedure including the risks, benefits and alternatives for the proposed anesthesia with the patient or authorized representative who has indicated his/her understanding and acceptance.   Dental advisory given  Plan Discussed with: CRNA and Surgeon  Anesthesia Plan Comments:        Anesthesia Quick Evaluation

## 2018-04-06 ENCOUNTER — Inpatient Hospital Stay (HOSPITAL_COMMUNITY)
Admission: RE | Disposition: A | Discharge status: Home Health | Payer: Self-pay | Source: Ambulatory Visit | Attending: Cardiothoracic Surgery

## 2018-04-06 ENCOUNTER — Inpatient Hospital Stay (HOSPITAL_COMMUNITY): Payer: Medicare Other

## 2018-04-06 ENCOUNTER — Inpatient Hospital Stay (HOSPITAL_COMMUNITY): Payer: Medicare Other | Admitting: Anesthesiology

## 2018-04-06 ENCOUNTER — Inpatient Hospital Stay (HOSPITAL_COMMUNITY)
Admission: RE | Admit: 2018-04-06 | Discharge: 2018-04-15 | DRG: 228 | Disposition: A | Discharge status: Home Health | Payer: Medicare Other | Source: Ambulatory Visit | Attending: Cardiothoracic Surgery | Admitting: Cardiothoracic Surgery

## 2018-04-06 ENCOUNTER — Encounter (HOSPITAL_COMMUNITY): Payer: Self-pay | Admitting: *Deleted

## 2018-04-06 DIAGNOSIS — E872 Acidosis: Secondary | ICD-10-CM | POA: Diagnosis not present

## 2018-04-06 DIAGNOSIS — Z888 Allergy status to other drugs, medicaments and biological substances status: Secondary | ICD-10-CM | POA: Diagnosis not present

## 2018-04-06 DIAGNOSIS — E785 Hyperlipidemia, unspecified: Secondary | ICD-10-CM | POA: Diagnosis present

## 2018-04-06 DIAGNOSIS — R918 Other nonspecific abnormal finding of lung field: Secondary | ICD-10-CM | POA: Diagnosis not present

## 2018-04-06 DIAGNOSIS — I340 Nonrheumatic mitral (valve) insufficiency: Secondary | ICD-10-CM | POA: Diagnosis not present

## 2018-04-06 DIAGNOSIS — D62 Acute posthemorrhagic anemia: Secondary | ICD-10-CM | POA: Diagnosis not present

## 2018-04-06 DIAGNOSIS — I480 Paroxysmal atrial fibrillation: Secondary | ICD-10-CM | POA: Diagnosis present

## 2018-04-06 DIAGNOSIS — Z833 Family history of diabetes mellitus: Secondary | ICD-10-CM

## 2018-04-06 DIAGNOSIS — Z811 Family history of alcohol abuse and dependence: Secondary | ICD-10-CM

## 2018-04-06 DIAGNOSIS — Z87442 Personal history of urinary calculi: Secondary | ICD-10-CM | POA: Diagnosis not present

## 2018-04-06 DIAGNOSIS — D696 Thrombocytopenia, unspecified: Secondary | ICD-10-CM | POA: Diagnosis not present

## 2018-04-06 DIAGNOSIS — I5042 Chronic combined systolic (congestive) and diastolic (congestive) heart failure: Secondary | ICD-10-CM | POA: Diagnosis not present

## 2018-04-06 DIAGNOSIS — I959 Hypotension, unspecified: Secondary | ICD-10-CM | POA: Diagnosis not present

## 2018-04-06 DIAGNOSIS — Z8249 Family history of ischemic heart disease and other diseases of the circulatory system: Secondary | ICD-10-CM

## 2018-04-06 DIAGNOSIS — J9589 Other postprocedural complications and disorders of respiratory system, not elsewhere classified: Secondary | ICD-10-CM | POA: Diagnosis not present

## 2018-04-06 DIAGNOSIS — J810 Acute pulmonary edema: Secondary | ICD-10-CM | POA: Diagnosis not present

## 2018-04-06 DIAGNOSIS — I481 Persistent atrial fibrillation: Secondary | ICD-10-CM | POA: Diagnosis not present

## 2018-04-06 DIAGNOSIS — J9811 Atelectasis: Secondary | ICD-10-CM | POA: Diagnosis not present

## 2018-04-06 DIAGNOSIS — Z809 Family history of malignant neoplasm, unspecified: Secondary | ICD-10-CM

## 2018-04-06 DIAGNOSIS — Z006 Encounter for examination for normal comparison and control in clinical research program: Secondary | ICD-10-CM

## 2018-04-06 DIAGNOSIS — J90 Pleural effusion, not elsewhere classified: Secondary | ICD-10-CM | POA: Diagnosis not present

## 2018-04-06 DIAGNOSIS — I255 Ischemic cardiomyopathy: Secondary | ICD-10-CM | POA: Diagnosis not present

## 2018-04-06 DIAGNOSIS — F17210 Nicotine dependence, cigarettes, uncomplicated: Secondary | ICD-10-CM | POA: Diagnosis present

## 2018-04-06 DIAGNOSIS — Z951 Presence of aortocoronary bypass graft: Secondary | ICD-10-CM | POA: Diagnosis not present

## 2018-04-06 DIAGNOSIS — J811 Chronic pulmonary edema: Secondary | ICD-10-CM

## 2018-04-06 DIAGNOSIS — R0789 Other chest pain: Secondary | ICD-10-CM | POA: Diagnosis not present

## 2018-04-06 DIAGNOSIS — I110 Hypertensive heart disease with heart failure: Secondary | ICD-10-CM | POA: Diagnosis not present

## 2018-04-06 DIAGNOSIS — I4819 Other persistent atrial fibrillation: Secondary | ICD-10-CM

## 2018-04-06 DIAGNOSIS — I2510 Atherosclerotic heart disease of native coronary artery without angina pectoris: Secondary | ICD-10-CM | POA: Diagnosis not present

## 2018-04-06 DIAGNOSIS — J449 Chronic obstructive pulmonary disease, unspecified: Secondary | ICD-10-CM | POA: Diagnosis not present

## 2018-04-06 DIAGNOSIS — K5900 Constipation, unspecified: Secondary | ICD-10-CM | POA: Diagnosis not present

## 2018-04-06 HISTORY — PX: MAZE: SHX5063

## 2018-04-06 HISTORY — PX: TEE WITHOUT CARDIOVERSION: SHX5443

## 2018-04-06 HISTORY — PX: CORONARY ARTERY BYPASS GRAFT: SHX141

## 2018-04-06 HISTORY — PX: CLIPPING OF ATRIAL APPENDAGE: SHX5773

## 2018-04-06 LAB — POCT I-STAT, CHEM 8
BUN: 14 mg/dL (ref 8–23)
BUN: 10 mg/dL (ref 8–23)
BUN: 12 mg/dL (ref 8–23)
BUN: 11 mg/dL (ref 8–23)
BUN: 11 mg/dL (ref 8–23)
BUN: 11 mg/dL (ref 8–23)
BUN: 10 mg/dL (ref 8–23)
BUN: 13 mg/dL (ref 8–23)
Calcium, Ion: 1.24 mmol/L (ref 1.15–1.40)
Calcium, Ion: 1.04 mmol/L — ABNORMAL LOW (ref 1.15–1.40)
Calcium, Ion: 1.23 mmol/L (ref 1.15–1.40)
Calcium, Ion: 1.13 mmol/L — ABNORMAL LOW (ref 1.15–1.40)
Calcium, Ion: 1.11 mmol/L — ABNORMAL LOW (ref 1.15–1.40)
Calcium, Ion: 1.13 mmol/L — ABNORMAL LOW (ref 1.15–1.40)
Calcium, Ion: 1.24 mmol/L (ref 1.15–1.40)
Calcium, Ion: 1.14 mmol/L — ABNORMAL LOW (ref 1.15–1.40)
Chloride: 105 mmol/L (ref 98–111)
Chloride: 102 mmol/L (ref 98–111)
Chloride: 105 mmol/L (ref 98–111)
Chloride: 103 mmol/L (ref 98–111)
Chloride: 104 mmol/L (ref 98–111)
Chloride: 105 mmol/L (ref 98–111)
Chloride: 103 mmol/L (ref 98–111)
Chloride: 106 mmol/L (ref 98–111)
Creatinine, Ser: 0.8 mg/dL (ref 0.61–1.24)
Creatinine, Ser: 0.6 mg/dL — ABNORMAL LOW (ref 0.61–1.24)
Creatinine, Ser: 0.8 mg/dL (ref 0.61–1.24)
Creatinine, Ser: 0.7 mg/dL (ref 0.61–1.24)
Creatinine, Ser: 0.6 mg/dL — ABNORMAL LOW (ref 0.61–1.24)
Creatinine, Ser: 0.6 mg/dL — ABNORMAL LOW (ref 0.61–1.24)
Creatinine, Ser: 0.7 mg/dL (ref 0.61–1.24)
Creatinine, Ser: 0.7 mg/dL (ref 0.61–1.24)
Glucose, Bld: 93 mg/dL (ref 70–99)
Glucose, Bld: 109 mg/dL — ABNORMAL HIGH (ref 70–99)
Glucose, Bld: 113 mg/dL — ABNORMAL HIGH (ref 70–99)
Glucose, Bld: 160 mg/dL — ABNORMAL HIGH (ref 70–99)
Glucose, Bld: 155 mg/dL — ABNORMAL HIGH (ref 70–99)
Glucose, Bld: 161 mg/dL — ABNORMAL HIGH (ref 70–99)
Glucose, Bld: 152 mg/dL — ABNORMAL HIGH (ref 70–99)
Glucose, Bld: 132 mg/dL — ABNORMAL HIGH (ref 70–99)
HCT: 36 % — ABNORMAL LOW (ref 39.0–52.0)
HCT: 30 % — ABNORMAL LOW (ref 39.0–52.0)
HCT: 35 % — ABNORMAL LOW (ref 39.0–52.0)
HCT: 33 % — ABNORMAL LOW (ref 39.0–52.0)
HCT: 32 % — ABNORMAL LOW (ref 39.0–52.0)
HCT: 33 % — ABNORMAL LOW (ref 39.0–52.0)
HCT: 30 % — ABNORMAL LOW (ref 39.0–52.0)
HCT: 33 % — ABNORMAL LOW (ref 39.0–52.0)
Hemoglobin: 12.2 g/dL — ABNORMAL LOW (ref 13.0–17.0)
Hemoglobin: 10.2 g/dL — ABNORMAL LOW (ref 13.0–17.0)
Hemoglobin: 11.9 g/dL — ABNORMAL LOW (ref 13.0–17.0)
Hemoglobin: 11.2 g/dL — ABNORMAL LOW (ref 13.0–17.0)
Hemoglobin: 10.9 g/dL — ABNORMAL LOW (ref 13.0–17.0)
Hemoglobin: 11.2 g/dL — ABNORMAL LOW (ref 13.0–17.0)
Hemoglobin: 10.2 g/dL — ABNORMAL LOW (ref 13.0–17.0)
Hemoglobin: 11.2 g/dL — ABNORMAL LOW (ref 13.0–17.0)
Potassium: 3.5 mmol/L (ref 3.5–5.1)
Potassium: 3.6 mmol/L (ref 3.5–5.1)
Potassium: 3.8 mmol/L (ref 3.5–5.1)
Potassium: 3.6 mmol/L (ref 3.5–5.1)
Potassium: 3.7 mmol/L (ref 3.5–5.1)
Potassium: 3.7 mmol/L (ref 3.5–5.1)
Potassium: 3.2 mmol/L — ABNORMAL LOW (ref 3.5–5.1)
Potassium: 4 mmol/L (ref 3.5–5.1)
Sodium: 142 mmol/L (ref 135–145)
Sodium: 143 mmol/L (ref 135–145)
Sodium: 144 mmol/L (ref 135–145)
Sodium: 142 mmol/L (ref 135–145)
Sodium: 142 mmol/L (ref 135–145)
Sodium: 143 mmol/L (ref 135–145)
Sodium: 146 mmol/L — ABNORMAL HIGH (ref 135–145)
Sodium: 142 mmol/L (ref 135–145)
TCO2: 26 mmol/L (ref 22–32)
TCO2: 25 mmol/L (ref 22–32)
TCO2: 27 mmol/L (ref 22–32)
TCO2: 25 mmol/L (ref 22–32)
TCO2: 25 mmol/L (ref 22–32)
TCO2: 26 mmol/L (ref 22–32)
TCO2: 24 mmol/L (ref 22–32)
TCO2: 23 mmol/L (ref 22–32)

## 2018-04-06 LAB — POCT I-STAT 3, ART BLOOD GAS (G3+)
Acid-base deficit: 1 mmol/L (ref 0.0–2.0)
Acid-base deficit: 1 mmol/L (ref 0.0–2.0)
Acid-base deficit: 3 mmol/L — ABNORMAL HIGH (ref 0.0–2.0)
Acid-base deficit: 2 mmol/L (ref 0.0–2.0)
Bicarbonate: 25 mmol/L (ref 20.0–28.0)
Bicarbonate: 25.9 mmol/L (ref 20.0–28.0)
Bicarbonate: 26.6 mmol/L (ref 20.0–28.0)
Bicarbonate: 24.4 mmol/L (ref 20.0–28.0)
Bicarbonate: 23.7 mmol/L (ref 20.0–28.0)
Bicarbonate: 23.9 mmol/L (ref 20.0–28.0)
O2 Saturation: 100 %
O2 Saturation: 100 %
O2 Saturation: 95 %
O2 Saturation: 97 %
O2 Saturation: 95 %
O2 Saturation: 93 %
Patient temperature: 35.7
Patient temperature: 35.7
Patient temperature: 36.2
Patient temperature: 36.4
TCO2: 26 mmol/L (ref 22–32)
TCO2: 27 mmol/L (ref 22–32)
TCO2: 28 mmol/L (ref 22–32)
TCO2: 26 mmol/L (ref 22–32)
TCO2: 25 mmol/L (ref 22–32)
TCO2: 25 mmol/L (ref 22–32)
pCO2 arterial: 43.1 mmHg (ref 32.0–48.0)
pCO2 arterial: 46.5 mmHg (ref 32.0–48.0)
pCO2 arterial: 56.1 mmHg — ABNORMAL HIGH (ref 32.0–48.0)
pCO2 arterial: 38.1 mmHg (ref 32.0–48.0)
pCO2 arterial: 46.1 mmHg (ref 32.0–48.0)
pCO2 arterial: 43.8 mmHg (ref 32.0–48.0)
pH, Arterial: 7.371 (ref 7.350–7.450)
pH, Arterial: 7.354 (ref 7.350–7.450)
pH, Arterial: 7.277 — ABNORMAL LOW (ref 7.350–7.450)
pH, Arterial: 7.408 (ref 7.350–7.450)
pH, Arterial: 7.315 — ABNORMAL LOW (ref 7.350–7.450)
pH, Arterial: 7.343 — ABNORMAL LOW (ref 7.350–7.450)
pO2, Arterial: 259 mmHg — ABNORMAL HIGH (ref 83.0–108.0)
pO2, Arterial: 393 mmHg — ABNORMAL HIGH (ref 83.0–108.0)
pO2, Arterial: 84 mmHg (ref 83.0–108.0)
pO2, Arterial: 83 mmHg (ref 83.0–108.0)
pO2, Arterial: 78 mmHg — ABNORMAL LOW (ref 83.0–108.0)
pO2, Arterial: 68 mmHg — ABNORMAL LOW (ref 83.0–108.0)

## 2018-04-06 LAB — POCT ACTIVATED CLOTTING TIME
Activated Clotting Time: 131 seconds
Activated Clotting Time: 631 seconds
Activated Clotting Time: 676 seconds
Activated Clotting Time: 499 seconds
Activated Clotting Time: 527 seconds
Activated Clotting Time: 577 seconds

## 2018-04-06 LAB — CBC
HCT: 35.5 % — ABNORMAL LOW (ref 39.0–52.0)
HCT: 35.8 % — ABNORMAL LOW (ref 39.0–52.0)
HEMOGLOBIN: 11.8 g/dL — AB (ref 13.0–17.0)
Hemoglobin: 11.9 g/dL — ABNORMAL LOW (ref 13.0–17.0)
MCH: 32.5 pg (ref 26.0–34.0)
MCH: 32.2 pg (ref 26.0–34.0)
MCHC: 33.2 g/dL (ref 30.0–36.0)
MCHC: 33.2 g/dL (ref 30.0–36.0)
MCV: 97.8 fL (ref 78.0–100.0)
MCV: 97 fL (ref 78.0–100.0)
PLATELETS: 153 10*3/uL (ref 150–400)
Platelets: 169 10*3/uL (ref 150–400)
RBC: 3.63 MIL/uL — AB (ref 4.22–5.81)
RBC: 3.69 MIL/uL — ABNORMAL LOW (ref 4.22–5.81)
RDW: 12.7 % (ref 11.5–15.5)
RDW: 12.4 % (ref 11.5–15.5)
WBC: 19.4 10*3/uL — AB (ref 4.0–10.5)
WBC: 16.8 10*3/uL — ABNORMAL HIGH (ref 4.0–10.5)

## 2018-04-06 LAB — POCT I-STAT 4, (NA,K, GLUC, HGB,HCT)
Glucose, Bld: 141 mg/dL — ABNORMAL HIGH (ref 70–99)
HCT: 33 % — ABNORMAL LOW (ref 39.0–52.0)
Hemoglobin: 11.2 g/dL — ABNORMAL LOW (ref 13.0–17.0)
Potassium: 3.2 mmol/L — ABNORMAL LOW (ref 3.5–5.1)
Sodium: 145 mmol/L (ref 135–145)

## 2018-04-06 LAB — GLUCOSE, CAPILLARY
Glucose-Capillary: 139 mg/dL — ABNORMAL HIGH (ref 70–99)
Glucose-Capillary: 134 mg/dL — ABNORMAL HIGH (ref 70–99)
Glucose-Capillary: 123 mg/dL — ABNORMAL HIGH (ref 70–99)
Glucose-Capillary: 116 mg/dL — ABNORMAL HIGH (ref 70–99)
Glucose-Capillary: 130 mg/dL — ABNORMAL HIGH (ref 70–99)
Glucose-Capillary: 119 mg/dL — ABNORMAL HIGH (ref 70–99)
Glucose-Capillary: 130 mg/dL — ABNORMAL HIGH (ref 70–99)

## 2018-04-06 LAB — HEMOGLOBIN AND HEMATOCRIT, BLOOD
HCT: 35.1 % — ABNORMAL LOW (ref 39.0–52.0)
Hemoglobin: 11.8 g/dL — ABNORMAL LOW (ref 13.0–17.0)

## 2018-04-06 LAB — MAGNESIUM: Magnesium: 3 mg/dL — ABNORMAL HIGH (ref 1.7–2.4)

## 2018-04-06 LAB — CREATININE, SERUM
Creatinine, Ser: 0.82 mg/dL (ref 0.61–1.24)
GFR calc Af Amer: >60 mL/min (ref >60)
GFR calc non Af Amer: >60 mL/min (ref >60)

## 2018-04-06 LAB — PLATELET COUNT: Platelets: 181 10*3/uL (ref 150–400)

## 2018-04-06 LAB — PROTIME-INR
INR: 1.37
PROTHROMBIN TIME: 16.8 s — AB (ref 11.4–15.2)

## 2018-04-06 LAB — APTT: APTT: 30 s (ref 24–36)

## 2018-04-06 SURGERY — CORONARY ARTERY BYPASS GRAFTING (CABG)
Anesthesia: General | Site: Chest

## 2018-04-06 MED ORDER — DEXMEDETOMIDINE HCL IN NACL 200 MCG/50ML IV SOLN
0.0000 ug/kg/h | INTRAVENOUS | Status: DC
  Filled 2018-04-06: qty 50

## 2018-04-06 MED ORDER — PROTAMINE SULFATE 10 MG/ML IV SOLN
INTRAVENOUS | Status: DC | PRN
  Administered 2018-04-06: 50 mg via INTRAVENOUS
  Administered 2018-04-06: 300 mg via INTRAVENOUS

## 2018-04-06 MED ORDER — NITROGLYCERIN IN D5W 200-5 MCG/ML-% IV SOLN: 0.0000 ug/min | INTRAVENOUS | Status: DC

## 2018-04-06 MED ORDER — SODIUM CHLORIDE 0.9% FLUSH: 10.0000 mL | INTRAVENOUS | Status: DC | PRN

## 2018-04-06 MED ORDER — CHLORHEXIDINE GLUCONATE 0.12 % MT SOLN
15.0000 mL | OROMUCOSAL | Status: AC
  Administered 2018-04-06: 15 mL via OROMUCOSAL

## 2018-04-06 MED ORDER — ACETAMINOPHEN 500 MG PO TABS
1000.0000 mg | ORAL_TABLET | Freq: Four times a day (QID) | ORAL | Status: AC
  Administered 2018-04-06 – 2018-04-11 (×17): 1000 mg via ORAL
  Filled 2018-04-06 (×17): qty 2

## 2018-04-06 MED ORDER — PHENYLEPHRINE 40 MCG/ML (10ML) SYRINGE FOR IV PUSH (FOR BLOOD PRESSURE SUPPORT)
PREFILLED_SYRINGE | INTRAVENOUS | Status: DC | PRN
  Administered 2018-04-06: 80 ug via INTRAVENOUS

## 2018-04-06 MED ORDER — PROTAMINE SULFATE 10 MG/ML IV SOLN
INTRAVENOUS | Status: AC
  Filled 2018-04-06: qty 25

## 2018-04-06 MED ORDER — MIDAZOLAM HCL 5 MG/5ML IJ SOLN
INTRAMUSCULAR | Status: DC | PRN
  Administered 2018-04-06: 1 mg via INTRAVENOUS
  Administered 2018-04-06: 3 mg via INTRAVENOUS
  Administered 2018-04-06 (×3): 2 mg via INTRAVENOUS

## 2018-04-06 MED ORDER — POLYVINYL ALCOHOL 1.4 % OP SOLN: 2.0000 [drp] | Freq: Three times a day (TID) | OPHTHALMIC | Status: DC | PRN

## 2018-04-06 MED ORDER — SODIUM CHLORIDE 0.9 % IV SOLN: 250.0000 mL | INTRAVENOUS | Status: DC

## 2018-04-06 MED ORDER — SODIUM CHLORIDE 0.9 % IV SOLN
INTRAVENOUS | Status: DC
  Administered 2018-04-06: 14:00:00 via INTRAVENOUS

## 2018-04-06 MED ORDER — GUAIFENESIN ER 600 MG PO TB12
600.0000 mg | ORAL_TABLET | Freq: Two times a day (BID) | ORAL | Status: DC
Start: 2018-04-06 — End: 2018-04-15
  Administered 2018-04-06 – 2018-04-15 (×18): 600 mg via ORAL
  Filled 2018-04-06 (×18): qty 1

## 2018-04-06 MED ORDER — ROCURONIUM BROMIDE 10 MG/ML (PF) SYRINGE
PREFILLED_SYRINGE | INTRAVENOUS | Status: AC
  Filled 2018-04-06: qty 20

## 2018-04-06 MED ORDER — MIDAZOLAM HCL 2 MG/2ML IJ SOLN: 2.0000 mg | INTRAMUSCULAR | Status: DC | PRN

## 2018-04-06 MED ORDER — BISACODYL 5 MG PO TBEC
10.0000 mg | DELAYED_RELEASE_TABLET | Freq: Every day | ORAL | Status: DC
  Administered 2018-04-07 – 2018-04-15 (×9): 10 mg via ORAL
  Filled 2018-04-06 (×9): qty 2

## 2018-04-06 MED ORDER — HEPARIN SODIUM (PORCINE) 1000 UNIT/ML IJ SOLN
INTRAMUSCULAR | Status: AC
  Filled 2018-04-06: qty 1

## 2018-04-06 MED ORDER — PANTOPRAZOLE SODIUM 40 MG PO TBEC
40.0000 mg | DELAYED_RELEASE_TABLET | Freq: Every day | ORAL | Status: DC
  Administered 2018-04-08 – 2018-04-15 (×8): 40 mg via ORAL
  Filled 2018-04-06 (×8): qty 1

## 2018-04-06 MED ORDER — ADULT MULTIVITAMIN W/MINERALS CH
1.0000 | ORAL_TABLET | Freq: Every day | ORAL | Status: DC
  Administered 2018-04-07 – 2018-04-15 (×9): 1 via ORAL
  Filled 2018-04-06 (×9): qty 1

## 2018-04-06 MED ORDER — DOPAMINE-DEXTROSE 3.2-5 MG/ML-% IV SOLN: 2.5000 ug/kg/min | INTRAVENOUS | Status: DC

## 2018-04-06 MED ORDER — ROCURONIUM BROMIDE 10 MG/ML (PF) SYRINGE
PREFILLED_SYRINGE | INTRAVENOUS | Status: AC
  Filled 2018-04-06: qty 10

## 2018-04-06 MED ORDER — ONDANSETRON HCL 4 MG/2ML IJ SOLN
INTRAMUSCULAR | Status: DC | PRN
  Administered 2018-04-06: 4 mg via INTRAVENOUS

## 2018-04-06 MED ORDER — PHENYLEPHRINE HCL-NACL 20-0.9 MG/250ML-% IV SOLN
0.0000 ug/min | INTRAVENOUS | Status: DC
  Administered 2018-04-06: 60 ug/min via INTRAVENOUS
  Administered 2018-04-07: 20 ug/min via INTRAVENOUS
  Administered 2018-04-07: 35 ug/min via INTRAVENOUS
  Filled 2018-04-06 (×4): qty 250

## 2018-04-06 MED ORDER — HEMOSTATIC AGENTS (NO CHARGE) OPTIME
TOPICAL | Status: DC | PRN
  Administered 2018-04-06: 1 via TOPICAL

## 2018-04-06 MED ORDER — DOCUSATE SODIUM 100 MG PO CAPS
200.0000 mg | ORAL_CAPSULE | Freq: Every day | ORAL | Status: DC
  Administered 2018-04-07 – 2018-04-15 (×9): 200 mg via ORAL
  Filled 2018-04-06 (×9): qty 2

## 2018-04-06 MED ORDER — SODIUM CHLORIDE 0.9 % IJ SOLN
INTRAMUSCULAR | Status: AC
  Filled 2018-04-06: qty 10

## 2018-04-06 MED ORDER — DEXAMETHASONE SODIUM PHOSPHATE 10 MG/ML IJ SOLN
INTRAMUSCULAR | Status: DC | PRN
  Administered 2018-04-06: 10 mg via INTRAVENOUS

## 2018-04-06 MED ORDER — FENTANYL CITRATE (PF) 250 MCG/5ML IJ SOLN
INTRAMUSCULAR | Status: AC
  Filled 2018-04-06: qty 25

## 2018-04-06 MED ORDER — CALCIUM CHLORIDE 10 % IV SOLN
INTRAVENOUS | Status: DC | PRN
  Administered 2018-04-06: 250 mg via INTRAVENOUS

## 2018-04-06 MED ORDER — INSULIN REGULAR BOLUS VIA INFUSION
0.0000 [IU] | Freq: Three times a day (TID) | INTRAVENOUS | Status: DC
  Administered 2018-04-07 (×2): 6 [IU] via INTRAVENOUS
  Filled 2018-04-06: qty 10

## 2018-04-06 MED ORDER — LACTATED RINGERS IV SOLN
INTRAVENOUS | Status: DC
  Administered 2018-04-12: 10 mL via INTRAVENOUS
  Administered 2018-04-12: 14:00:00 via INTRAVENOUS

## 2018-04-06 MED ORDER — POTASSIUM CHLORIDE 10 MEQ/50ML IV SOLN
10.0000 meq | INTRAVENOUS | Status: AC
  Administered 2018-04-06 (×3): 10 meq via INTRAVENOUS

## 2018-04-06 MED ORDER — CHLORHEXIDINE GLUCONATE 0.12% ORAL RINSE (MEDLINE KIT)
15.0000 mL | Freq: Two times a day (BID) | OROMUCOSAL | Status: DC
  Administered 2018-04-06 – 2018-04-07 (×2): 15 mL via OROMUCOSAL

## 2018-04-06 MED ORDER — ASPIRIN 81 MG PO CHEW: 324.0000 mg | CHEWABLE_TABLET | Freq: Every day | ORAL | Status: DC

## 2018-04-06 MED ORDER — MORPHINE SULFATE (PF) 2 MG/ML IV SOLN: 1.0000 mg | INTRAVENOUS | Status: DC | PRN

## 2018-04-06 MED ORDER — LACTATED RINGERS IV SOLN: 500.0000 mL | Freq: Once | INTRAVENOUS | Status: DC | PRN

## 2018-04-06 MED ORDER — MIDAZOLAM HCL 10 MG/2ML IJ SOLN
INTRAMUSCULAR | Status: AC
  Filled 2018-04-06: qty 2

## 2018-04-06 MED ORDER — MILRINONE LACTATE IN DEXTROSE 20-5 MG/100ML-% IV SOLN
0.1250 ug/kg/min | INTRAVENOUS | Status: DC
  Administered 2018-04-06 – 2018-04-09 (×6): 0.25 ug/kg/min via INTRAVENOUS
  Administered 2018-04-10 (×2): 0.125 ug/kg/min via INTRAVENOUS
  Administered 2018-04-10: 0.25 ug/kg/min via INTRAVENOUS
  Filled 2018-04-06 (×8): qty 100

## 2018-04-06 MED ORDER — SODIUM CHLORIDE 0.9 % IV SOLN
1.5000 g | Freq: Two times a day (BID) | INTRAVENOUS | Status: AC
  Administered 2018-04-06 – 2018-04-08 (×4): 1.5 g via INTRAVENOUS
  Filled 2018-04-06 (×4): qty 1.5

## 2018-04-06 MED ORDER — ACETAMINOPHEN 160 MG/5ML PO SOLN: 1000.0000 mg | Freq: Four times a day (QID) | ORAL | Status: AC

## 2018-04-06 MED ORDER — ROCURONIUM BROMIDE 10 MG/ML (PF) SYRINGE
PREFILLED_SYRINGE | INTRAVENOUS | Status: DC | PRN
  Administered 2018-04-06: 50 mg via INTRAVENOUS
  Administered 2018-04-06: 20 mg via INTRAVENOUS
  Administered 2018-04-06: 50 mg via INTRAVENOUS
  Administered 2018-04-06: 80 mg via INTRAVENOUS

## 2018-04-06 MED ORDER — BISACODYL 10 MG RE SUPP: 10.0000 mg | Freq: Every day | RECTAL | Status: DC

## 2018-04-06 MED ORDER — CHLORHEXIDINE GLUCONATE CLOTH 2 % EX PADS
6.0000 | MEDICATED_PAD | Freq: Every day | CUTANEOUS | Status: DC
  Administered 2018-04-06 – 2018-04-09 (×4): 6 via TOPICAL

## 2018-04-06 MED ORDER — ONDANSETRON HCL 4 MG/2ML IJ SOLN: 4.0000 mg | Freq: Four times a day (QID) | INTRAMUSCULAR | Status: DC | PRN

## 2018-04-06 MED ORDER — SODIUM CHLORIDE 0.9% FLUSH
10.0000 mL | Freq: Two times a day (BID) | INTRAVENOUS | Status: DC
  Administered 2018-04-06 – 2018-04-12 (×3): 10 mL
  Administered 2018-04-12: 20 mL
  Administered 2018-04-13: 10 mL
  Administered 2018-04-13: 20 mL

## 2018-04-06 MED ORDER — LACTATED RINGERS IV SOLN
INTRAVENOUS | Status: DC | PRN
  Administered 2018-04-06: 07:00:00 via INTRAVENOUS

## 2018-04-06 MED ORDER — MORPHINE SULFATE (PF) 2 MG/ML IV SOLN
2.0000 mg | INTRAVENOUS | Status: DC | PRN
  Administered 2018-04-06 – 2018-04-07 (×3): 2 mg via INTRAVENOUS
  Filled 2018-04-06 (×3): qty 1

## 2018-04-06 MED ORDER — PROTAMINE SULFATE 10 MG/ML IV SOLN
INTRAVENOUS | Status: AC
  Filled 2018-04-06: qty 10

## 2018-04-06 MED ORDER — CHLORHEXIDINE GLUCONATE 0.12 % MT SOLN
15.0000 mL | Freq: Once | OROMUCOSAL | Status: AC
  Administered 2018-04-06: 15 mL via OROMUCOSAL
  Filled 2018-04-06: qty 15

## 2018-04-06 MED ORDER — HEMOSTATIC AGENTS (NO CHARGE) OPTIME
TOPICAL | Status: DC | PRN
  Administered 2018-04-06 (×2): 1 via TOPICAL

## 2018-04-06 MED ORDER — ORAL CARE MOUTH RINSE
15.0000 mL | OROMUCOSAL | Status: DC
  Administered 2018-04-06 (×3): 15 mL via OROMUCOSAL

## 2018-04-06 MED ORDER — VANCOMYCIN HCL IN DEXTROSE 1-5 GM/200ML-% IV SOLN
1000.0000 mg | Freq: Once | INTRAVENOUS | Status: AC
  Administered 2018-04-06: 1000 mg via INTRAVENOUS
  Filled 2018-04-06: qty 200

## 2018-04-06 MED ORDER — ACETAMINOPHEN 650 MG RE SUPP
650.0000 mg | Freq: Once | RECTAL | Status: AC
  Administered 2018-04-06: 650 mg via RECTAL

## 2018-04-06 MED ORDER — MOMETASONE FURO-FORMOTEROL FUM 200-5 MCG/ACT IN AERO
2.0000 | INHALATION_SPRAY | Freq: Two times a day (BID) | RESPIRATORY_TRACT | Status: DC
  Administered 2018-04-06 – 2018-04-15 (×16): 2 via RESPIRATORY_TRACT
  Filled 2018-04-06 (×2): qty 8.8

## 2018-04-06 MED ORDER — TRIAMCINOLONE ACETONIDE 0.1 % EX CREA: 1.0000 "application " | TOPICAL_CREAM | Freq: Two times a day (BID) | CUTANEOUS | Status: DC | PRN

## 2018-04-06 MED ORDER — LEVALBUTEROL HCL 1.25 MG/0.5ML IN NEBU
1.2500 mg | INHALATION_SOLUTION | Freq: Four times a day (QID) | RESPIRATORY_TRACT | Status: DC
  Administered 2018-04-06: 1.25 mg via RESPIRATORY_TRACT
  Filled 2018-04-06: qty 0.5

## 2018-04-06 MED ORDER — VENLAFAXINE HCL ER 75 MG PO CP24
150.0000 mg | ORAL_CAPSULE | Freq: Every day | ORAL | Status: DC
  Administered 2018-04-07 – 2018-04-15 (×9): 150 mg via ORAL
  Filled 2018-04-06: qty 1
  Filled 2018-04-06: qty 2
  Filled 2018-04-06 (×3): qty 1
  Filled 2018-04-06: qty 2
  Filled 2018-04-06 (×3): qty 1

## 2018-04-06 MED ORDER — FENTANYL CITRATE (PF) 250 MCG/5ML IJ SOLN
INTRAMUSCULAR | Status: DC | PRN
  Administered 2018-04-06: 200 ug via INTRAVENOUS
  Administered 2018-04-06 (×2): 50 ug via INTRAVENOUS
  Administered 2018-04-06: 200 ug via INTRAVENOUS
  Administered 2018-04-06: 150 ug via INTRAVENOUS
  Administered 2018-04-06: 100 ug via INTRAVENOUS

## 2018-04-06 MED ORDER — PHENYLEPHRINE 40 MCG/ML (10ML) SYRINGE FOR IV PUSH (FOR BLOOD PRESSURE SUPPORT)
PREFILLED_SYRINGE | INTRAVENOUS | Status: AC
  Filled 2018-04-06: qty 10

## 2018-04-06 MED ORDER — METOCLOPRAMIDE HCL 5 MG/ML IJ SOLN
10.0000 mg | Freq: Four times a day (QID) | INTRAMUSCULAR | Status: AC
  Administered 2018-04-06 – 2018-04-10 (×18): 10 mg via INTRAVENOUS
  Filled 2018-04-06 (×17): qty 2

## 2018-04-06 MED ORDER — DEXAMETHASONE SODIUM PHOSPHATE 10 MG/ML IJ SOLN
INTRAMUSCULAR | Status: AC
  Filled 2018-04-06: qty 1

## 2018-04-06 MED ORDER — ONDANSETRON HCL 4 MG/2ML IJ SOLN
INTRAMUSCULAR | Status: AC
  Filled 2018-04-06: qty 2

## 2018-04-06 MED ORDER — LACTATED RINGERS IV SOLN: INTRAVENOUS | Status: DC

## 2018-04-06 MED ORDER — ALBUMIN HUMAN 5 % IV SOLN
INTRAVENOUS | Status: DC | PRN
  Administered 2018-04-06: 14:00:00 via INTRAVENOUS

## 2018-04-06 MED ORDER — CEFUROXIME SODIUM 750 MG IJ SOLR
INTRAMUSCULAR | Status: DC | PRN
  Administered 2018-04-06: 750 mg via INTRAVENOUS

## 2018-04-06 MED ORDER — OMEGA-3-ACID ETHYL ESTERS 1 G PO CAPS
1.0000 g | ORAL_CAPSULE | Freq: Two times a day (BID) | ORAL | Status: DC
  Administered 2018-04-06 – 2018-04-07 (×2): 1 g via ORAL
  Filled 2018-04-06 (×2): qty 1

## 2018-04-06 MED ORDER — CLONAZEPAM 1 MG PO TABS
1.0000 mg | ORAL_TABLET | Freq: Two times a day (BID) | ORAL | Status: DC | PRN
  Administered 2018-04-09: 1 mg via ORAL
  Filled 2018-04-06: qty 1

## 2018-04-06 MED ORDER — ASPIRIN EC 325 MG PO TBEC
325.0000 mg | DELAYED_RELEASE_TABLET | Freq: Every day | ORAL | Status: DC
  Administered 2018-04-07 – 2018-04-13 (×7): 325 mg via ORAL
  Filled 2018-04-06 (×7): qty 1

## 2018-04-06 MED ORDER — OXYCODONE HCL 5 MG PO TABS
5.0000 mg | ORAL_TABLET | ORAL | Status: DC | PRN
  Administered 2018-04-06 – 2018-04-07 (×3): 10 mg via ORAL
  Administered 2018-04-08 – 2018-04-09 (×3): 5 mg via ORAL
  Administered 2018-04-09: 10 mg via ORAL
  Filled 2018-04-06 (×3): qty 2
  Filled 2018-04-06 (×2): qty 1
  Filled 2018-04-06: qty 2
  Filled 2018-04-06: qty 1

## 2018-04-06 MED ORDER — HEPARIN SODIUM (PORCINE) 1000 UNIT/ML IJ SOLN
INTRAMUSCULAR | Status: DC | PRN
  Administered 2018-04-06 (×2): 2000 [IU] via INTRAVENOUS
  Administered 2018-04-06: 31000 [IU] via INTRAVENOUS

## 2018-04-06 MED ORDER — SODIUM CHLORIDE 0.9% FLUSH
3.0000 mL | Freq: Two times a day (BID) | INTRAVENOUS | Status: DC
  Administered 2018-04-08 – 2018-04-11 (×6): 3 mL via INTRAVENOUS

## 2018-04-06 MED ORDER — CHLORHEXIDINE GLUCONATE 4 % EX LIQD: 30.0000 mL | CUTANEOUS | Status: DC

## 2018-04-06 MED ORDER — METOPROLOL TARTRATE 5 MG/5ML IV SOLN: 2.5000 mg | INTRAVENOUS | Status: DC | PRN

## 2018-04-06 MED ORDER — ACETAMINOPHEN 160 MG/5ML PO SOLN: 650.0000 mg | Freq: Once | ORAL | Status: AC

## 2018-04-06 MED ORDER — PROPOFOL 10 MG/ML IV BOLUS
INTRAVENOUS | Status: AC
  Filled 2018-04-06: qty 20

## 2018-04-06 MED ORDER — SODIUM CHLORIDE 0.9% FLUSH: 3.0000 mL | INTRAVENOUS | Status: DC | PRN

## 2018-04-06 MED ORDER — METOPROLOL TARTRATE 12.5 MG HALF TABLET: 12.5000 mg | ORAL_TABLET | Freq: Two times a day (BID) | ORAL | Status: DC

## 2018-04-06 MED ORDER — DESMOPRESSIN ACETATE 4 MCG/ML IJ SOLN
20.0000 ug | INTRAMUSCULAR | Status: AC
  Administered 2018-04-06: 20 ug via INTRAVENOUS
  Filled 2018-04-06: qty 5

## 2018-04-06 MED ORDER — DEXTROSE 5 % IV SOLN
INTRAVENOUS | Status: DC | PRN
  Administered 2018-04-06: 20 ug/min via INTRAVENOUS

## 2018-04-06 MED ORDER — LEVALBUTEROL HCL 1.25 MG/0.5ML IN NEBU
1.2500 mg | INHALATION_SOLUTION | Freq: Three times a day (TID) | RESPIRATORY_TRACT | Status: DC
  Administered 2018-04-07 – 2018-04-09 (×7): 1.25 mg via RESPIRATORY_TRACT
  Filled 2018-04-06 (×6): qty 0.5

## 2018-04-06 MED ORDER — METOPROLOL TARTRATE 12.5 MG HALF TABLET
12.5000 mg | ORAL_TABLET | Freq: Once | ORAL | Status: AC
  Administered 2018-04-06: 12.5 mg via ORAL
  Filled 2018-04-06: qty 1

## 2018-04-06 MED ORDER — METOPROLOL TARTRATE 25 MG/10 ML ORAL SUSPENSION: 12.5000 mg | Freq: Two times a day (BID) | ORAL | Status: DC

## 2018-04-06 MED ORDER — TRAMADOL HCL 50 MG PO TABS
50.0000 mg | ORAL_TABLET | ORAL | Status: DC | PRN
  Administered 2018-04-07 (×2): 100 mg via ORAL
  Administered 2018-04-08: 50 mg via ORAL
  Filled 2018-04-06 (×2): qty 2
  Filled 2018-04-06: qty 1

## 2018-04-06 MED ORDER — ALBUMIN HUMAN 5 % IV SOLN
250.0000 mL | INTRAVENOUS | Status: AC | PRN
  Administered 2018-04-06: 250 mL via INTRAVENOUS

## 2018-04-06 MED ORDER — MAGNESIUM SULFATE 4 GM/100ML IV SOLN
4.0000 g | Freq: Once | INTRAVENOUS | Status: AC
  Administered 2018-04-06: 4 g via INTRAVENOUS
  Filled 2018-04-06: qty 100

## 2018-04-06 MED ORDER — PROPOFOL 10 MG/ML IV BOLUS
INTRAVENOUS | Status: DC | PRN
  Administered 2018-04-06: 100 mg via INTRAVENOUS

## 2018-04-06 MED ORDER — SODIUM CHLORIDE 0.9 % IV SOLN
INTRAVENOUS | Status: DC
  Filled 2018-04-06: qty 1

## 2018-04-06 MED ORDER — SODIUM CHLORIDE 0.45 % IV SOLN
INTRAVENOUS | Status: DC | PRN
  Administered 2018-04-06: 14:00:00 via INTRAVENOUS

## 2018-04-06 MED ORDER — VITAMIN C 500 MG PO TABS
1000.0000 mg | ORAL_TABLET | Freq: Every day | ORAL | Status: DC
  Administered 2018-04-07 – 2018-04-15 (×9): 1000 mg via ORAL
  Filled 2018-04-06 (×9): qty 2

## 2018-04-06 MED ORDER — LEVALBUTEROL HCL 1.25 MG/0.5ML IN NEBU: 1.2500 mg | INHALATION_SOLUTION | Freq: Four times a day (QID) | RESPIRATORY_TRACT | Status: DC

## 2018-04-06 MED ORDER — LACTATED RINGERS IV SOLN
INTRAVENOUS | Status: DC | PRN
  Administered 2018-04-06 (×2): via INTRAVENOUS

## 2018-04-06 MED ORDER — 0.9 % SODIUM CHLORIDE (POUR BTL) OPTIME
TOPICAL | Status: DC | PRN
  Administered 2018-04-06: 1000 mL
  Administered 2018-04-06: 5000 mL

## 2018-04-06 MED ORDER — FAMOTIDINE IN NACL 20-0.9 MG/50ML-% IV SOLN
20.0000 mg | Freq: Two times a day (BID) | INTRAVENOUS | Status: AC
  Administered 2018-04-06 (×2): 20 mg via INTRAVENOUS
  Filled 2018-04-06: qty 50

## 2018-04-06 SURGICAL SUPPLY — 112 items
ADAPTER CARDIO PERF ANTE/RETRO (ADAPTER) ×5 IMPLANT
ADPR PRFSN 84XANTGRD RTRGD (ADAPTER) ×2
ATRICLIP EXCLUSION VLAA SYSTEM (Miscellaneous) ×5 IMPLANT
BAG DECANTER FOR FLEXI CONT (MISCELLANEOUS) ×5 IMPLANT
BANDAGE ACE 4X5 VEL STRL LF (GAUZE/BANDAGES/DRESSINGS) ×5 IMPLANT
BANDAGE ACE 6X5 VEL STRL LF (GAUZE/BANDAGES/DRESSINGS) ×5 IMPLANT
BASKET HEART  (ORDER IN 25'S) (MISCELLANEOUS) ×1
BASKET HEART (ORDER IN 25'S) (MISCELLANEOUS) ×1
BASKET HEART (ORDER IN 25S) (MISCELLANEOUS) ×3 IMPLANT
BLADE CLIPPER SURG (BLADE) ×5 IMPLANT
BLADE STERNUM SYSTEM 6 (BLADE) ×5 IMPLANT
BLADE SURG 11 STRL SS (BLADE) ×5 IMPLANT
BLADE SURG 12 STRL SS (BLADE) ×5 IMPLANT
BNDG GAUZE ELAST 4 BULKY (GAUZE/BANDAGES/DRESSINGS) ×5 IMPLANT
CANISTER SUCT 3000ML PPV (MISCELLANEOUS) ×5 IMPLANT
CANNULA GUNDRY RCSP 15FR (MISCELLANEOUS) ×5 IMPLANT
CARDIOBLATE CARDIAC ABLATION (MISCELLANEOUS)
CATH CPB KIT VANTRIGT (MISCELLANEOUS) ×5 IMPLANT
CATH ROBINSON RED A/P 18FR (CATHETERS) ×15 IMPLANT
CATH THORACIC 36FR RT ANG (CATHETERS) ×5 IMPLANT
CLAMP ISOLATOR SYNERGY LG (MISCELLANEOUS) ×5 IMPLANT
CLIP FOGARTY SPRING 6M (CLIP) ×5 IMPLANT
CLIP RETRACTION 3.0MM CORONARY (MISCELLANEOUS) ×5 IMPLANT
CLIP VESOCCLUDE SM WIDE 24/CT (CLIP) ×5 IMPLANT
CRADLE DONUT ADULT HEAD (MISCELLANEOUS) ×5 IMPLANT
DERMABOND ADVANCED (GAUZE/BANDAGES/DRESSINGS) ×2
DERMABOND ADVANCED .7 DNX12 (GAUZE/BANDAGES/DRESSINGS) ×3 IMPLANT
DEVICE CARDIOBLATE CARDIAC ABL (MISCELLANEOUS) IMPLANT
DRAIN CHANNEL 32F RND 10.7 FF (WOUND CARE) ×5 IMPLANT
DRAPE CARDIOVASCULAR INCISE (DRAPES) ×3
DRAPE SLUSH/WARMER DISC (DRAPES) ×5 IMPLANT
DRAPE SRG 135X102X78XABS (DRAPES) ×3 IMPLANT
DRSG AQUACEL AG ADV 3.5X14 (GAUZE/BANDAGES/DRESSINGS) ×5 IMPLANT
ELECT BLADE 4.0 EZ CLEAN MEGAD (MISCELLANEOUS) ×5
ELECT BLADE 6.5 EXT (BLADE) ×5 IMPLANT
ELECT CAUTERY BLADE 6.4 (BLADE) ×5 IMPLANT
ELECT REM PT RETURN 9FT ADLT (ELECTROSURGICAL) ×10
ELECTRODE BLDE 4.0 EZ CLN MEGD (MISCELLANEOUS) ×3 IMPLANT
ELECTRODE REM PT RTRN 9FT ADLT (ELECTROSURGICAL) ×6 IMPLANT
FELT TEFLON 1X6 (MISCELLANEOUS) ×5 IMPLANT
FLOSEAL 5ML (HEMOSTASIS) ×10 IMPLANT
GAUZE SPONGE 4X4 12PLY STRL (GAUZE/BANDAGES/DRESSINGS) ×10 IMPLANT
GLOVE BIO SURGEON STRL SZ 6 (GLOVE) ×10 IMPLANT
GLOVE BIO SURGEON STRL SZ 6.5 (GLOVE) ×24 IMPLANT
GLOVE BIO SURGEON STRL SZ7.5 (GLOVE) ×15 IMPLANT
GLOVE BIO SURGEON STRL SZ8.5 (GLOVE) ×5 IMPLANT
GLOVE BIO SURGEONS STRL SZ 6.5 (GLOVE) ×6
GOWN STRL REUS W/ TWL LRG LVL3 (GOWN DISPOSABLE) ×24 IMPLANT
GOWN STRL REUS W/TWL LRG LVL3 (GOWN DISPOSABLE) ×16
HEMOSTAT POWDER SURGIFOAM 1G (HEMOSTASIS) IMPLANT
HEMOSTAT SURGICEL 2X14 (HEMOSTASIS) ×5 IMPLANT
INSERT FOGARTY XLG (MISCELLANEOUS) IMPLANT
KIT BASIN OR (CUSTOM PROCEDURE TRAY) ×5 IMPLANT
KIT SUCTION CATH 14FR (SUCTIONS) ×5 IMPLANT
KIT TURNOVER KIT B (KITS) ×5 IMPLANT
KIT VASOVIEW HEMOPRO VH 3000 (KITS) ×5 IMPLANT
LEAD PACING MYOCARDI (MISCELLANEOUS) ×5 IMPLANT
LINE VENT (MISCELLANEOUS) ×5 IMPLANT
LOOP VESSEL SUPERMAXI WHITE (MISCELLANEOUS) ×5 IMPLANT
MARKER GRAFT CORONARY BYPASS (MISCELLANEOUS) ×15 IMPLANT
NS IRRIG 1000ML POUR BTL (IV SOLUTION) ×30 IMPLANT
PACK E OPEN HEART (SUTURE) ×5 IMPLANT
PACK OPEN HEART (CUSTOM PROCEDURE TRAY) ×5 IMPLANT
PAD ARMBOARD 7.5X6 YLW CONV (MISCELLANEOUS) ×10 IMPLANT
PAD ELECT DEFIB RADIOL ZOLL (MISCELLANEOUS) ×5 IMPLANT
PENCIL BUTTON HOLSTER BLD 10FT (ELECTRODE) ×5 IMPLANT
POWDER SURGICEL 3.0 GRAM (HEMOSTASIS) ×10 IMPLANT
PROBE CRYO2-ABLATION MALLABLE (MISCELLANEOUS) IMPLANT
PUNCH AORTIC ROTATE  4.5MM 8IN (MISCELLANEOUS) ×5 IMPLANT
PUNCH AORTIC ROTATE 4.0MM (MISCELLANEOUS) IMPLANT
PUNCH AORTIC ROTATE 4.5MM 8IN (MISCELLANEOUS) IMPLANT
PUNCH AORTIC ROTATE 5MM 8IN (MISCELLANEOUS) IMPLANT
SET CARDIOPLEGIA MPS 5001102 (MISCELLANEOUS) ×5 IMPLANT
SOLUTION ANTI FOG 6CC (MISCELLANEOUS) ×5 IMPLANT
SPONGE LAP 18X18 X RAY DECT (DISPOSABLE) ×15 IMPLANT
SPONGE LAP 4X18 RFD (DISPOSABLE) ×5 IMPLANT
SURGIFLO W/THROMBIN 8M KIT (HEMOSTASIS) IMPLANT
SUT BONE WAX W31G (SUTURE) ×5 IMPLANT
SUT MNCRL AB 4-0 PS2 18 (SUTURE) ×5 IMPLANT
SUT PROLENE 3 0 SH DA (SUTURE) IMPLANT
SUT PROLENE 3 0 SH1 36 (SUTURE) IMPLANT
SUT PROLENE 4 0 RB 1 (SUTURE) ×6
SUT PROLENE 4 0 SH DA (SUTURE) ×5 IMPLANT
SUT PROLENE 4-0 RB1 .5 CRCL 36 (SUTURE) ×6 IMPLANT
SUT PROLENE 5 0 C 1 36 (SUTURE) IMPLANT
SUT PROLENE 6 0 C 1 30 (SUTURE) ×15 IMPLANT
SUT PROLENE 6 0 CC (SUTURE) ×15 IMPLANT
SUT PROLENE 8 0 BV175 6 (SUTURE) ×15 IMPLANT
SUT PROLENE BLUE 7 0 (SUTURE) ×5 IMPLANT
SUT PROLENE POLY MONO (SUTURE) ×10 IMPLANT
SUT SILK  1 MH (SUTURE)
SUT SILK 1 MH (SUTURE) IMPLANT
SUT SILK 2 0 SH CR/8 (SUTURE) IMPLANT
SUT SILK 3 0 SH CR/8 (SUTURE) ×5 IMPLANT
SUT STEEL 6MS V (SUTURE) ×10 IMPLANT
SUT STEEL SZ 6 DBL 3X14 BALL (SUTURE) ×10 IMPLANT
SUT VIC AB 1 CTX 36 (SUTURE) ×6
SUT VIC AB 1 CTX36XBRD ANBCTR (SUTURE) ×9 IMPLANT
SUT VIC AB 2-0 CT1 27 (SUTURE) ×4
SUT VIC AB 2-0 CT1 TAPERPNT 27 (SUTURE) ×3 IMPLANT
SUT VIC AB 2-0 CTX 27 (SUTURE) IMPLANT
SUT VIC AB 3-0 X1 27 (SUTURE) IMPLANT
SYSTEM EXCLUSION ATRICLIP VLAA (Miscellaneous) ×3 IMPLANT
SYSTEM SAHARA CHEST DRAIN ATS (WOUND CARE) ×5 IMPLANT
TAPE CLOTH SURG 4X10 WHT LF (GAUZE/BANDAGES/DRESSINGS) ×5 IMPLANT
TAPE PAPER 2X10 WHT MICROPORE (GAUZE/BANDAGES/DRESSINGS) ×5 IMPLANT
TOWEL GREEN STERILE (TOWEL DISPOSABLE) ×5 IMPLANT
TOWEL GREEN STERILE FF (TOWEL DISPOSABLE) ×5 IMPLANT
TRAY FOLEY SLVR 16FR TEMP STAT (SET/KITS/TRAYS/PACK) ×5 IMPLANT
TUBING INSUFFLATION (TUBING) ×5 IMPLANT
UNDERPAD 30X30 (UNDERPADS AND DIAPERS) ×5 IMPLANT
WATER STERILE IRR 1000ML POUR (IV SOLUTION) ×10 IMPLANT

## 2018-04-06 NOTE — Anesthesia Procedure Notes (Signed)
Central Venous Catheter Insertion Performed by: Val EagleMoser, Warner Laduca, MD, anesthesiologist Start/End7/10/2017 6:49 AM, 04/06/2018 6:59 AM Patient location: Pre-op. Preanesthetic checklist: patient identified, IV checked, site marked, risks and benefits discussed, surgical consent, monitors and equipment checked, pre-op evaluation, timeout performed and anesthesia consent Lidocaine 1% used for infiltration and patient sedated Hand hygiene performed  and maximum sterile barriers used  Catheter size: 8.5 Fr Total catheter length 10. Sheath introducer Procedure performed using ultrasound guided technique. Ultrasound Notes:anatomy identified, needle tip was noted to be adjacent to the nerve/plexus identified, no ultrasound evidence of intravascular and/or intraneural injection and image(s) printed for medical record Attempts: 1 Following insertion, line sutured, dressing applied and Biopatch. Post procedure assessment: blood return through all ports, free fluid flow and no air  Patient tolerated the procedure well with no immediate complications.

## 2018-04-06 NOTE — Anesthesia Procedure Notes (Signed)
Central Venous Catheter Insertion Performed by: Val EagleMoser, Veneta Sliter, MD, anesthesiologist Start/End7/10/2017 6:49 AM, 04/06/2018 6:59 AM Patient location: Pre-op. Preanesthetic checklist: patient identified, IV checked, site marked, risks and benefits discussed, surgical consent, monitors and equipment checked, pre-op evaluation, timeout performed and anesthesia consent Hand hygiene performed  and maximum sterile barriers used  PA cath was placed.Swan type:thermodilution Procedure performed without using ultrasound guided technique. Attempts: 1 Patient tolerated the procedure well with no immediate complications.

## 2018-04-06 NOTE — Transfer of Care (Signed)
Immediate Anesthesia Transfer of Care Note  Patient: Jeremy King  Procedure(s) Performed: CORONARY ARTERY BYPASS GRAFTING (CABG) x 4 WITH ENDOSCOPIC HARVESTING OF RIGHT SAPHENOUS VEIN (N/A Chest) MAZE (N/A ) TRANSESOPHAGEAL ECHOCARDIOGRAM (TEE) (N/A ) CLIPPING OF ATRIAL APPENDAGE USING ATRICURE ATRICLIP ACHV40 (Left )  Patient Location: ICU  Anesthesia Type:General  Level of Consciousness: sedated and Patient remains intubated per anesthesia plan  Airway & Oxygen Therapy: Patient remains intubated per anesthesia plan and Patient placed on Ventilator (see vital sign flow sheet for setting)  Post-op Assessment: Report given to RN and Post -op Vital signs reviewed and stable  Post vital signs: Reviewed and stable  Last Vitals:  Vitals Value Taken Time  BP    Temp    Pulse    Resp    SpO2      Last Pain:  Vitals:   04/06/18 0601  PainSc: 0-No pain      Patients Stated Pain Goal: 3 (04/06/18 0601)  Complications: No apparent anesthesia complications

## 2018-04-06 NOTE — Procedures (Signed)
Extubation Procedure Note  Patient Details:   Name: Laurey MoraleKimon R Ganim DOB: [birth year] MRN: [id number]   Airway Documentation:    Vent end date: (not recorded) Vent end time: (not recorded)   Evaluation  O2 sats: stable throughout Complications: No apparent complications Patient did tolerate procedure well. Bilateral Breath Sounds: Clear, Diminished   Yes  Peggye FormSteven  Amylia Collazos 04/06/2018, 6:18 PM

## 2018-04-06 NOTE — Anesthesia Procedure Notes (Signed)
Arterial Line Insertion Start/End7/10/2017 7:00 AM, 04/06/2018 7:10 AM Performed by: Elliot DallyHuggins, Kristapher [place], CRNA, CRNA  Preanesthetic checklist: patient identified, IV checked, site marked, risks and benefits discussed, surgical consent, monitors and equipment checked, pre-op evaluation and anesthesia consent Patient sedated Left, radial was placed Catheter size: 20 G Hand hygiene performed  and maximum sterile barriers used  Allen's test indicative of satisfactory collateral circulation Attempts: 2 Procedure performed without using ultrasound guided technique. Following insertion, Biopatch and dressing applied. Post procedure assessment: normal  Post procedure complications: local hematoma. Patient tolerated the procedure well with no immediate complications. Additional procedure comments: First attempt unable to thread catheter, small hematoma noted. Pressure applied. With later reassessment hematoma was stable, good capillary refill, patient had no complaints of numbness/weakness. .[name]

## 2018-04-06 NOTE — H&P (Signed)
PCP is [name], MD Referring Provider is No ref. provider found  No chief complaint on file.  Patient examined, images from coronary angiogram and transthoracic echocardiogram personally reviewed and counseled with patient   HPI: 67 year old nondiabetic smoker presents after recently being diagnosed with severe multivessel coronary artery disease with mild-moderate LV dysfunction.  Patient has been followed by cardiology for the past 3 years for persistent atrial fibrillation.  He has had at least 2 cardioversions but remains in chronic atrial fibrillation now on Eliquis and metoprolol.  Echocardiogram shows mild hypokinesia EF 45% normal LVEDP at cath with tight proximal RCA stenosis [dominant] and tight proximal LAD and tight ostial diagonal stenosis.  His stress test was positive preceding the cardiac cath.  His cardiologist has recommended multivessel bypass surgery with combined left atrial Maze procedure and left atrial clipping. Patient smokes 1 pack/day. He has hypertension and positive family history of CAD.  His father died from an MI.  He denies previous stroke, DVT, varicose veins, claudication, TIA.  He has had previous surgery for kidney stones with general anesthesia which he tolerated well.  No bleeding complications of the Eliquis.  Patient is retired and lives alone but has very close neighbors  Past Medical History:  Diagnosis Date  . Anxiety   . Back ache   . Coronary artery disease   . History of kidney stones   . Hypotension   . Kidney stones   . Paroxysmal atrial fibrillation (HCC)    chads2vasc score is at least 1  . Syncope    due to hypotensin/ complicated by RVR with afib    Past Surgical History:  Procedure Laterality Date  . CARDIAC CATHETERIZATION    . CARDIOVERSION N/A 12/30/2014   Procedure: CARDIOVERSION;  Surgeon: Chrystie Nose, MD;  Location: [facility] ENDOSCOPY;  Service: Cardiovascular;  Laterality: N/A;  . CARDIOVERSION N/A 02/16/2018    Procedure: CARDIOVERSION;  Surgeon: Chrystie Nose, MD;  Location: [facility] ENDOSCOPY;  Service: Cardiovascular;  Laterality: N/A;  . LEFT HEART CATH AND CORONARY ANGIOGRAPHY N/A 03/12/2018   Procedure: LEFT HEART CATH AND CORONARY ANGIOGRAPHY;  Surgeon: [name], MD;  Location: [facility] INVASIVE CV LAB;  Service: Cardiovascular;  Laterality: N/A;  . LITHOTRIPSY    . RIGHT ROTATOR CUFF    . TEE WITHOUT CARDIOVERSION N/A 12/30/2014   Procedure: TRANSESOPHAGEAL ECHOCARDIOGRAM (TEE);  Surgeon: Chrystie Nose, MD;  Location: [facility] ENDOSCOPY;  Service: Cardiovascular;  Laterality: N/A;    Family History  Problem Relation Age of Onset  . Heart failure Mother        mitral valve disease  . Diabetes Father   . Heart failure Father        MI  . Hypertension Father   . CAD Father   . Alcoholism Father   . Cancer Sister     Social History Social History   Tobacco Use  . Smoking status: Current Every Day Smoker    Packs/day: 1.00    Types: Cigarettes  . Smokeless tobacco: Former Neurosurgeon  . Tobacco comment: approx 1ppd - maybe a little less (04/18/15)  Substance Use Topics  . Alcohol use: No    Alcohol/week: 0.0 oz  . Drug use: No    Current Facility-Administered Medications  Medication Dose Route Frequency Provider Last Rate Last Dose  . cefUROXime (ZINACEF) 1.5 g in sodium chloride 0.9 % 100 mL IVPB  1.5 g Intravenous To OR Kerin Perna, MD      . cefUROXime (  ZINACEF) 750 mg in sodium chloride 0.9 % 100 mL IVPB  750 mg Intravenous To OR Donata ClayVan Trigt, Theron AristaPeter, MD      . chlorhexidine (HIBICLENS) 4 % liquid 2 application  30 mL Topical UD Donata ClayVan Trigt, Theron AristaPeter, MD      . dexmedetomidine (PRECEDEX) 400 MCG/100ML (4 mcg/mL) infusion  0.1-0.7 mcg/kg/hr Intravenous To OR Donata ClayVan Trigt, Theron AristaPeter, MD      . DOPamine (INTROPIN) 800 mg in dextrose 5 % 250 mL (3.2 mg/mL) infusion  0-10 mcg/kg/min Intravenous To OR Donata ClayVan Trigt, Theron AristaPeter, MD      . EPINEPHrine (ADRENALIN) 4 mg in dextrose 5 % 250 mL (0.016 mg/mL) infusion   0-10 mcg/min Intravenous To OR Donata ClayVan Trigt, Theron AristaPeter, MD      . heparin 2,500 Units, papaverine 30 mg in electrolyte-148 (PLASMALYTE-148) 500 mL irrigation   Irrigation To OR Donata ClayVan Trigt, Theron AristaPeter, MD      . heparin 30,000 units/NS 1000 mL solution for CELLSAVER   Other To OR Donata ClayVan Trigt, Theron AristaPeter, MD      . insulin regular (NOVOLIN R,HUMULIN R) 100 Units in sodium chloride 0.9 % 100 mL (1 Units/mL) infusion   Intravenous To OR Donata ClayVan Trigt, Theron AristaPeter, MD      . magnesium sulfate (IV Push/IM) injection 40 mEq  40 mEq Other To OR Donata ClayVan Trigt, Theron AristaPeter, MD      . milrinone (PRIMACOR) 20 MG/100 ML (0.2 mg/mL) infusion  0.125 mcg/kg/min Intravenous To OR Donata ClayVan Trigt, Theron AristaPeter, MD      . nitroGLYCERIN 50 mg in dextrose 5 % 250 mL (0.2 mg/mL) infusion  2-200 mcg/min Intravenous To OR Donata ClayVan Trigt, Theron AristaPeter, MD      . phenylephrine (NEO-SYNEPHRINE) 20 mg in sodium chloride 0.9 % 250 mL (0.08 mg/mL) infusion  30-200 mcg/min Intravenous To OR Donata ClayVan Trigt, Theron AristaPeter, MD      . potassium chloride injection 80 mEq  80 mEq Other To OR Donata ClayVan Trigt, Theron AristaPeter, MD      . tranexamic acid (CYKLOKAPRON) 2,500 mg in sodium chloride 0.9 % 250 mL (10 mg/mL) infusion  1.5 mg/kg/hr Intravenous To OR Donata ClayVan Trigt, Theron AristaPeter, MD      . tranexamic acid (CYKLOKAPRON) bolus via infusion - over 30 minutes 1,498.5 mg  15 mg/kg Intravenous To OR Donata ClayVan Trigt, Theron AristaPeter, MD      . tranexamic acid (CYKLOKAPRON) pump prime solution 200 mg  2 mg/kg Intracatheter To OR Donata ClayVan Trigt, Theron AristaPeter, MD      . vancomycin (VANCOCIN) 1,500 mg in sodium chloride 0.9 % 250 mL IVPB  1,500 mg Intravenous To OR Kerin PernaVan Trigt, Peter, MD       Facility-Administered Medications Ordered in Other Encounters  Medication Dose Route Frequency Provider Last Rate Last Dose  . fentaNYL (SUBLIMAZE) injection   Intravenous Anesthesia Intra-op Elliot DallyHuggins, Diana, CRNA   50 mcg at 04/06/18 [phone]  . lactated ringers infusion    Continuous PRN Elliot DallyHuggins, Diana, CRNA      . midazolam (VERSED) 5 MG/5ML injection   Intravenous Anesthesia Intra-op  Elliot DallyHuggins, Diana, CRNA   2 mg at 04/06/18 [phone]    Allergies  Allergen Reactions  . Sertraline Hcl Other (See Comments)    Extreme headaches  . Trazodone And Nefazodone Other (See Comments)    Irregular heart beat? Hypotension?    Review of Systems                    Review of Systems :  [ y ] = yes, [  ] = no  General :  Weight gain [   ]    Weight loss  [ y  ]  Fatigue Cove.Etienne  ]  Fever [  ]  Chills  [  ]                                          HEENT    Headache [  ]  Dizziness [  ]  Blurred vision [  ] Glaucoma  [  ]                          Nosebleeds [  ] Painful or loose teeth [  ]        Cardiac :  Chest pain/ pressure [  ]  Resting SOB [  ] exertional SOB Cove.Etienne  ]                        Orthopnea [  ]  Pedal edema  [  ]  Palpitations Cove.Etienne  ] Syncope/presyncope [ y]                        Paroxysmal nocturnal dyspnea [  ]         Pulmonary : cough [  ]  wheezing [  ]  Hemoptysis [  ] Sputum [  ] Snoring [  ]                              Pneumothorax [  ]  Sleep apnea [  ]        GI : Vomiting [  ]  Dysphagia [  ]  Melena  [  ]  Abdominal pain [  ] BRBPR [  ]              Heart burn [ y ]  Constipation [  ] Diarrhea  [  ] Colonoscopy [   ]        GU : Hematuria [  ]  Dysuria [  ]  Nocturia [  ] UTI's [  ]        Vascular : Claudication [  ]  Rest pain [  ]  DVT [  ] Vein stripping [  ] leg ulcers [  ]                          TIA [  ] Stroke [  ]  Varicose veins [  ]        NEURO :  Headaches  [  ] Seizures [  ] Vision changes [  ] Paresthesias [  ]                                               Musculoskeletal :  Arthritis [  ] Gout  [  ]  Back pain [  ]  Joint pain [  ]        Skin :  Rash [  ]  Melanoma [  ] Sores [  ]  Heme : Bleeding problems [  ]Clotting Disorders [  ] Anemia [  ]Blood Transfusion [ ]         Endocrine : Diabetes [  ] Heat or Cold intolerance [  ] Polyuria [  ]excessive thirst [ ]         Psych : Depression [  ]  Anxiety [  ]  Psych  hospitalizations [  ] Memory change [  ]       Right-hand-dominant Edentulous Distant history of crush injury to his chest with right-sided broken ribs and fractured right clavicle but no pneumothorax History of orthostatic dizziness possibly related to his A. fib now much better that he is not taking flomax and trazodone                                                                 BP 134/90   Pulse 83   Temp 97.7 F (36.5 C)   Resp 20   SpO2 97%  Physical Exam      Exam    General- alert and comfortable    Neck- no JVD, no cervical adenopathy palpable, no carotid bruit   Lungs- clear without rales, wheezes   Cor- regular rate and rhythm, no murmur , gallop   Abdomen- soft, non-tender   Extremities - warm, non-tender, minimal edema   Neuro- oriented, appropriate, no focal weakness   Diagnostic Tests: Angiograms reviewed showing significant blockage with adequate targets of the posterior descending, LAD, first diagonal, second diagonal.  No significant valve disease on echocardiogram.  EF 45%  Impression: Three-vessel CAD with mild-moderate LV dysfunction History of persistent atrial fibrillation History of orthostatic dizziness  Plan: Patient presents for multivessel CABG with combined Maze procedure  He has stopped his Eliquis He has no new questions ans agrees to proceed with surgery today Jeremy Bussing, MD [facility] [phone]

## 2018-04-06 NOTE — Anesthesia Procedure Notes (Signed)
Procedure Name: Intubation Date/Time: 04/06/2018 7:43 AM Performed by: Elliot DallyHuggins, Ahriana Gunkel, CRNA Pre-anesthesia Checklist: Patient identified, Emergency Drugs available, Suction available and Patient being monitored Patient Re-evaluated:Patient Re-evaluated prior to induction Oxygen Delivery Method: Circle System Utilized Preoxygenation: Pre-oxygenation with 100% oxygen Induction Type: IV induction Ventilation: Mask ventilation without difficulty Laryngoscope Size: Miller and 3 Grade View: Grade I Tube type: Oral Tube size: 8.0 mm Number of attempts: 1 Airway Equipment and Method: Stylet and Oral airway Placement Confirmation: ETT inserted through vocal cords under direct vision,  positive ETCO2 and breath sounds checked- equal and bilateral Secured at: 23 cm Tube secured with: Tape Dental Injury: Teeth and Oropharynx as per pre-operative assessment

## 2018-04-06 NOTE — Brief Op Note (Signed)
04/06/2018  2:22 PM  PATIENT:  Laurey MoraleKimon R Hageman  67 y.o. male  PRE-OPERATIVE DIAGNOSIS:  CAD AFIB  POST-OPERATIVE DIAGNOSIS:  CAD AFIB  PROCEDURE:  Procedure(s):  CORONARY ARTERY BYPASS GRAFTING x 4 -LIMA to LAD -SVG to PDA -SVG to RAMUS INTERMEDIATE -SVG to DIAGONAL  ENDOSCOPIC HARVEST GREATER SAPHENOUS VEIN -Right Leg  MODIFIED MAZE PROCEDURE -Right and Left Pulmonary Vein Isolation -Clipping of LA Appendage with Size 40 mm Atricure Clip  TRANSESOPHAGEAL ECHOCARDIOGRAM (TEE) (N/A)  SURGEON:  Surgeon(s) and Role:    Kerin Perna* Van Trigt, Peter, MD - Primary  PHYSICIAN ASSISTANT: Lowella DandyErin Janari Gagner PA-C  ANESTHESIA:   general  EBL:    BLOOD ADMINISTERED:CELLSAVER and 2 units FFP  DRAINS: Left Pleural Chest Tube, Mediastinal Chest Drains   LOCAL MEDICATIONS USED:  NONE  SPECIMEN:  No Specimen  DISPOSITION OF SPECIMEN:  N/A  COUNTS:  YES  TOURNIQUET:  * No tourniquets in log *  DICTATION: .Dragon Dictation  PLAN OF CARE: Admit to inpatient   PATIENT DISPOSITION:  ICU - intubated and hemodynamically stable.   Delay start of Pharmacological VTE agent (>24hrs) due to surgical blood loss or risk of bleeding: yes

## 2018-04-06 NOTE — Progress Notes (Addendum)
[facility] BRIEF SICU PROGRESS NOTE  Day of Surgery  S/P Procedure(s) (LRB): CORONARY ARTERY BYPASS GRAFTING (CABG) x 4 WITH ENDOSCOPIC HARVESTING OF RIGHT SAPHENOUS VEIN (N/A) MAZE (N/A) TRANSESOPHAGEAL ECHOCARDIOGRAM (TEE) (N/A) CLIPPING OF ATRIAL APPENDAGE USING ATRICURE ATRICLIP ACHV40 (Left)   Extubated uneventfully NSR - AAI paced rhythm w/ stable hemodynamics on low dose milrinone, dopamine and Neo for BP support Breathing comfortably w/ O2 sats 96% Chest tube output low UOP excellent Labs okay  Plan: Continue routine early postop  Purcell Nailslarence H Maude Hettich, MD 04/06/2018 7:02 PM

## 2018-04-06 NOTE — Progress Notes (Signed)
Pre Procedure note for inpatients:   Jeremy King has been scheduled for Procedure(s): CORONARY ARTERY BYPASS GRAFTING (CABG) (N/A) MAZE (N/A) TRANSESOPHAGEAL ECHOCARDIOGRAM (TEE) (N/A) today. The various methods of treatment have been discussed with the patient. After consideration of the risks, benefits and treatment options the patient has consented to the planned procedure.   The patient has been seen and labs reviewed. There are no changes in the patient's condition to prevent proceeding with the planned procedure today.  Recent labs:  Lab Results  Component Value Date   WBC 7.7 04/02/2018   HGB 15.1 04/02/2018   HCT 45.7 04/02/2018   PLT 196 04/02/2018   GLUCOSE 95 04/02/2018   CHOL 188 10/17/2017   TRIG 239.0 (H) 10/17/2017   HDL 37.00 (L) 10/17/2017   LDLDIRECT 143.0 10/17/2017   LDLCALC 149 (H) 11/27/2014   ALT 18 04/02/2018   AST 18 04/02/2018   NA 141 04/02/2018   K 3.8 04/02/2018   CL 110 04/02/2018   CREATININE 0.92 04/02/2018   BUN 15 04/02/2018   CO2 20 (L) 04/02/2018   TSH 0.724 03/09/2018   PSA 3.01 04/03/2007   INR 0.99 04/02/2018   HGBA1C 5.3 04/02/2018    Kerin PernaPeter Van Trigt III, MD 04/06/2018 7:03 AM

## 2018-04-06 NOTE — Anesthesia Postprocedure Evaluation (Signed)
Anesthesia Post Note  Patient: Jeremy King  Procedure(s) Performed: CORONARY ARTERY BYPASS GRAFTING (CABG) x 4 WITH ENDOSCOPIC HARVESTING OF RIGHT SAPHENOUS VEIN (N/A Chest) MAZE (N/A ) TRANSESOPHAGEAL ECHOCARDIOGRAM (TEE) (N/A ) CLIPPING OF ATRIAL APPENDAGE USING ATRICURE ATRICLIP ACHV40 (Left )     Patient location during evaluation: SICU Anesthesia Type: General Level of consciousness: sedated Pain management: pain level controlled Vital Signs Assessment: post-procedure vital signs reviewed and stable Respiratory status: patient remains intubated per anesthesia plan Cardiovascular status: stable Postop Assessment: no apparent nausea or vomiting Anesthetic complications: no    Last Vitals:  Vitals:   04/06/18 0543  BP: 134/90  Pulse: 83  Resp: 20  Temp: 36.5 C  SpO2: 97%    Last Pain:  Vitals:   04/06/18 0601  PainSc: 0-No pain                 Eulogia Dismore DANIEL

## 2018-04-07 ENCOUNTER — Inpatient Hospital Stay (HOSPITAL_COMMUNITY): Payer: Medicare Other

## 2018-04-07 ENCOUNTER — Other Ambulatory Visit: Payer: Self-pay

## 2018-04-07 ENCOUNTER — Encounter (HOSPITAL_COMMUNITY): Payer: Self-pay | Admitting: Cardiothoracic Surgery

## 2018-04-07 LAB — BPAM FFP
Blood Product Expiration Date: 201907062359
Blood Product Expiration Date: 201907062359
ISSUE DATE / TIME: 201907011104
ISSUE DATE / TIME: 201907011104
Unit Type and Rh: 6200
Unit Type and Rh: 6200

## 2018-04-07 LAB — GLUCOSE, CAPILLARY
Glucose-Capillary: 102 mg/dL — ABNORMAL HIGH (ref 70–99)
Glucose-Capillary: 105 mg/dL — ABNORMAL HIGH (ref 70–99)
Glucose-Capillary: 109 mg/dL — ABNORMAL HIGH (ref 70–99)
Glucose-Capillary: 116 mg/dL — ABNORMAL HIGH (ref 70–99)
Glucose-Capillary: 117 mg/dL — ABNORMAL HIGH (ref 70–99)
Glucose-Capillary: 120 mg/dL — ABNORMAL HIGH (ref 70–99)
Glucose-Capillary: 124 mg/dL — ABNORMAL HIGH (ref 70–99)
Glucose-Capillary: 130 mg/dL — ABNORMAL HIGH (ref 70–99)
Glucose-Capillary: 131 mg/dL — ABNORMAL HIGH (ref 70–99)
Glucose-Capillary: 136 mg/dL — ABNORMAL HIGH (ref 70–99)
Glucose-Capillary: 138 mg/dL — ABNORMAL HIGH (ref 70–99)
Glucose-Capillary: 140 mg/dL — ABNORMAL HIGH (ref 70–99)
Glucose-Capillary: 150 mg/dL — ABNORMAL HIGH (ref 70–99)
Glucose-Capillary: 151 mg/dL — ABNORMAL HIGH (ref 70–99)
Glucose-Capillary: 156 mg/dL — ABNORMAL HIGH (ref 70–99)
Glucose-Capillary: 161 mg/dL — ABNORMAL HIGH (ref 70–99)
Glucose-Capillary: 99 mg/dL (ref 70–99)

## 2018-04-07 LAB — CBC
HCT: 32.4 % — ABNORMAL LOW (ref 39.0–52.0)
HEMATOCRIT: 32.8 % — AB (ref 39.0–52.0)
HEMOGLOBIN: 11 g/dL — AB (ref 13.0–17.0)
HEMOGLOBIN: 10.6 g/dL — AB (ref 13.0–17.0)
MCH: 32.7 pg (ref 26.0–34.0)
MCH: 32.6 pg (ref 26.0–34.0)
MCHC: 33.5 g/dL (ref 30.0–36.0)
MCHC: 32.7 g/dL (ref 30.0–36.0)
MCV: 97.6 fL (ref 78.0–100.0)
MCV: 99.7 fL (ref 78.0–100.0)
Platelets: 149 10*3/uL — ABNORMAL LOW (ref 150–400)
Platelets: 145 10*3/uL — ABNORMAL LOW (ref 150–400)
RBC: 3.36 MIL/uL — AB (ref 4.22–5.81)
RBC: 3.25 MIL/uL — AB (ref 4.22–5.81)
RDW: 12.7 % (ref 11.5–15.5)
RDW: 13 % (ref 11.5–15.5)
WBC: 15.9 10*3/uL — ABNORMAL HIGH (ref 4.0–10.5)
WBC: 20 10*3/uL — ABNORMAL HIGH (ref 4.0–10.5)

## 2018-04-07 LAB — TYPE AND SCREEN
ABO/RH(D): O POS
Antibody Screen: NEGATIVE

## 2018-04-07 LAB — POCT I-STAT 3, ART BLOOD GAS (G3+)
Acid-base deficit: 5 mmol/L — ABNORMAL HIGH (ref 0.0–2.0)
Acid-base deficit: 6 mmol/L — ABNORMAL HIGH (ref 0.0–2.0)
Acid-base deficit: 3 mmol/L — ABNORMAL HIGH (ref 0.0–2.0)
Acid-base deficit: 1 mmol/L (ref 0.0–2.0)
Bicarbonate: 20.2 mmol/L (ref 20.0–28.0)
Bicarbonate: 20.1 mmol/L (ref 20.0–28.0)
Bicarbonate: 22.7 mmol/L (ref 20.0–28.0)
Bicarbonate: 24.3 mmol/L (ref 20.0–28.0)
O2 Saturation: 87 %
O2 Saturation: 89 %
O2 Saturation: 85 %
O2 Saturation: 91 %
Patient temperature: 97.6
Patient temperature: 98
TCO2: 21 mmol/L — ABNORMAL LOW (ref 22–32)
TCO2: 21 mmol/L — ABNORMAL LOW (ref 22–32)
TCO2: 24 mmol/L (ref 22–32)
TCO2: 26 mmol/L (ref 22–32)
pCO2 arterial: 37.5 mmHg (ref 32.0–48.0)
pCO2 arterial: 39.4 mmHg (ref 32.0–48.0)
pCO2 arterial: 42.3 mmHg (ref 32.0–48.0)
pCO2 arterial: 43.7 mmHg (ref 32.0–48.0)
pH, Arterial: 7.339 — ABNORMAL LOW (ref 7.350–7.450)
pH, Arterial: 7.312 — ABNORMAL LOW (ref 7.350–7.450)
pH, Arterial: 7.338 — ABNORMAL LOW (ref 7.350–7.450)
pH, Arterial: 7.351 (ref 7.350–7.450)
pO2, Arterial: 56 mmHg — ABNORMAL LOW (ref 83.0–108.0)
pO2, Arterial: 59 mmHg — ABNORMAL LOW (ref 83.0–108.0)
pO2, Arterial: 53 mmHg — ABNORMAL LOW (ref 83.0–108.0)
pO2, Arterial: 63 mmHg — ABNORMAL LOW (ref 83.0–108.0)

## 2018-04-07 LAB — BASIC METABOLIC PANEL
Anion gap: 3 — ABNORMAL LOW (ref 5–15)
BUN: 14 mg/dL (ref 8–23)
CHLORIDE: 111 mmol/L (ref 98–111)
CO2: 23 mmol/L (ref 22–32)
Calcium: 7.6 mg/dL — ABNORMAL LOW (ref 8.9–10.3)
Creatinine, Ser: 0.87 mg/dL (ref 0.61–1.24)
GFR calc non Af Amer: >60 mL/min (ref >60)
Glucose, Bld: 111 mg/dL — ABNORMAL HIGH (ref 70–99)
POTASSIUM: 4 mmol/L (ref 3.5–5.1)
SODIUM: 137 mmol/L (ref 135–145)

## 2018-04-07 LAB — BPAM PLATELET PHERESIS
Blood Product Expiration Date: 201907012359
ISSUE DATE / TIME: 201907011331
Unit Type and Rh: 6200

## 2018-04-07 LAB — POCT I-STAT, CHEM 8
BUN: 19 mg/dL (ref 8–23)
Calcium, Ion: 1.14 mmol/L — ABNORMAL LOW (ref 1.15–1.40)
Chloride: 102 mmol/L (ref 98–111)
Creatinine, Ser: 1.3 mg/dL — ABNORMAL HIGH (ref 0.61–1.24)
Glucose, Bld: 161 mg/dL — ABNORMAL HIGH (ref 70–99)
HCT: 29 % — ABNORMAL LOW (ref 39.0–52.0)
Hemoglobin: 9.9 g/dL — ABNORMAL LOW (ref 13.0–17.0)
Potassium: 3.9 mmol/L (ref 3.5–5.1)
Sodium: 137 mmol/L (ref 135–145)
TCO2: 21 mmol/L — ABNORMAL LOW (ref 22–32)

## 2018-04-07 LAB — PREPARE FRESH FROZEN PLASMA
Unit division: 0
Unit division: 0

## 2018-04-07 LAB — COOXEMETRY PANEL
Carboxyhemoglobin: 1.4 % (ref 0.5–1.5)
Carboxyhemoglobin: 1 % (ref 0.5–1.5)
Methemoglobin: 1 % (ref 0.0–1.5)
Methemoglobin: 1.7 % — ABNORMAL HIGH (ref 0.0–1.5)
O2 Saturation: 64.8 %
O2 Saturation: 71.6 %
Total hemoglobin: 10.4 g/dL — ABNORMAL LOW (ref 12.0–16.0)
Total hemoglobin: 10.5 g/dL — ABNORMAL LOW (ref 12.0–16.0)

## 2018-04-07 LAB — PREPARE PLATELET PHERESIS: Unit division: 0

## 2018-04-07 LAB — MAGNESIUM
MAGNESIUM: 2.6 mg/dL — AB (ref 1.7–2.4)
Magnesium: 2.5 mg/dL — ABNORMAL HIGH (ref 1.7–2.4)

## 2018-04-07 LAB — CREATININE, SERUM
CREATININE: 1.35 mg/dL — AB (ref 0.61–1.24)
GFR calc Af Amer: >60 mL/min (ref >60)
GFR calc non Af Amer: 52 mL/min — ABNORMAL LOW (ref >60)

## 2018-04-07 MED ORDER — SODIUM CHLORIDE 0.9 % IV SOLN
1.0000 g | Freq: Once | INTRAVENOUS | Status: DC
  Filled 2018-04-07: qty 10

## 2018-04-07 MED ORDER — AMIODARONE HCL IN DEXTROSE 360-4.14 MG/200ML-% IV SOLN
30.0000 mg/h | INTRAVENOUS | Status: DC
  Administered 2018-04-07: 30 mg/h via INTRAVENOUS
  Filled 2018-04-07 (×3): qty 200

## 2018-04-07 MED ORDER — ALBUMIN HUMAN 5 % IV SOLN
250.0000 mL | INTRAVENOUS | Status: AC | PRN
  Administered 2018-04-07: 250 mL via INTRAVENOUS

## 2018-04-07 MED ORDER — AMIODARONE HCL IN DEXTROSE 360-4.14 MG/200ML-% IV SOLN
60.0000 mg/h | INTRAVENOUS | Status: AC
  Administered 2018-04-07: 60 mg/h via INTRAVENOUS
  Filled 2018-04-07: qty 200

## 2018-04-07 MED ORDER — AMIODARONE IV BOLUS ONLY 150 MG/100ML
150.0000 mg | Freq: Once | INTRAVENOUS | Status: AC
  Administered 2018-04-07: 150 mg via INTRAVENOUS
  Filled 2018-04-07: qty 100

## 2018-04-07 MED ORDER — ALBUMIN HUMAN 5 % IV SOLN
INTRAVENOUS | Status: AC
  Administered 2018-04-07: 250 mL via INTRAVENOUS
  Filled 2018-04-07: qty 250

## 2018-04-07 MED ORDER — ENOXAPARIN SODIUM 40 MG/0.4ML ~~LOC~~ SOLN
40.0000 mg | Freq: Every day | SUBCUTANEOUS | Status: AC
  Administered 2018-04-07 – 2018-04-13 (×7): 40 mg via SUBCUTANEOUS
  Filled 2018-04-07 (×7): qty 0.4

## 2018-04-07 MED ORDER — DOPAMINE-DEXTROSE 3.2-5 MG/ML-% IV SOLN
INTRAVENOUS | Status: AC
  Administered 2018-04-07: 2.5 ug/kg/min via INTRAVENOUS
  Filled 2018-04-07: qty 250

## 2018-04-07 MED ORDER — AMIODARONE HCL IN DEXTROSE 360-4.14 MG/200ML-% IV SOLN
60.0000 mg/h | INTRAVENOUS | Status: AC
  Administered 2018-04-07 (×2): 60 mg/h via INTRAVENOUS
  Filled 2018-04-07: qty 200

## 2018-04-07 MED ORDER — SODIUM BICARBONATE 8.4 % IV SOLN
100.0000 meq | Freq: Once | INTRAVENOUS | Status: AC
  Administered 2018-04-07: 100 meq via INTRAVENOUS

## 2018-04-07 MED ORDER — ALBUMIN HUMAN 5 % IV SOLN
12.5000 g | Freq: Four times a day (QID) | INTRAVENOUS | Status: AC
  Administered 2018-04-07 – 2018-04-08 (×4): 12.5 g via INTRAVENOUS
  Filled 2018-04-07 (×5): qty 250

## 2018-04-07 MED ORDER — INSULIN ASPART 100 UNIT/ML ~~LOC~~ SOLN
0.0000 [IU] | SUBCUTANEOUS | Status: DC
  Administered 2018-04-07: 2 [IU] via SUBCUTANEOUS
  Administered 2018-04-07: 4 [IU] via SUBCUTANEOUS
  Administered 2018-04-07: 2 [IU] via SUBCUTANEOUS
  Administered 2018-04-08: 4 [IU] via SUBCUTANEOUS
  Administered 2018-04-08: 8 [IU] via SUBCUTANEOUS
  Administered 2018-04-08 (×2): 2 [IU] via SUBCUTANEOUS

## 2018-04-07 MED ORDER — DIGOXIN 0.25 MG/ML IJ SOLN
0.5000 mg | Freq: Once | INTRAMUSCULAR | Status: AC
  Administered 2018-04-07: 0.5 mg via INTRAVENOUS
  Filled 2018-04-07: qty 2

## 2018-04-07 MED ORDER — DOPAMINE-DEXTROSE 3.2-5 MG/ML-% IV SOLN
2.0000 ug/kg/min | INTRAVENOUS | Status: DC
  Administered 2018-04-07 – 2018-04-09 (×2): 2.5 ug/kg/min via INTRAVENOUS
  Filled 2018-04-07: qty 250

## 2018-04-07 MED ORDER — AMIODARONE HCL 200 MG PO TABS
400.0000 mg | ORAL_TABLET | Freq: Two times a day (BID) | ORAL | Status: DC
  Administered 2018-04-07: 400 mg via ORAL
  Filled 2018-04-07: qty 2

## 2018-04-07 MED ORDER — ORAL CARE MOUTH RINSE
15.0000 mL | Freq: Two times a day (BID) | OROMUCOSAL | Status: DC
  Administered 2018-04-07 – 2018-04-15 (×13): 15 mL via OROMUCOSAL

## 2018-04-07 MED ORDER — FENTANYL CITRATE (PF) 100 MCG/2ML IJ SOLN
50.0000 ug | INTRAMUSCULAR | Status: DC | PRN
  Administered 2018-04-07 – 2018-04-08 (×3): 50 ug via INTRAVENOUS
  Filled 2018-04-07 (×3): qty 2

## 2018-04-07 MED ORDER — SODIUM BICARBONATE 8.4 % IV SOLN
INTRAVENOUS | Status: AC
  Administered 2018-04-07: 100 meq via INTRAVENOUS
  Filled 2018-04-07: qty 100

## 2018-04-07 MED ORDER — CALCIUM CHLORIDE 10 % IV SOLN
INTRAVENOUS | Status: AC
  Administered 2018-04-07: 1000 mg
  Filled 2018-04-07: qty 10

## 2018-04-07 MED FILL — Heparin Sodium (Porcine) Inj 1000 Unit/ML: INTRAMUSCULAR | Qty: 20 | Status: AC

## 2018-04-07 MED FILL — Sodium Bicarbonate IV Soln 8.4%: INTRAVENOUS | Qty: 50 | Status: AC

## 2018-04-07 MED FILL — Mannitol IV Soln 20%: INTRAVENOUS | Qty: 500 | Status: AC

## 2018-04-07 MED FILL — Sodium Chloride IV Soln 0.9%: INTRAVENOUS | Qty: 2000 | Status: AC

## 2018-04-07 MED FILL — Electrolyte-R (PH 7.4) Solution: INTRAVENOUS | Qty: 4000 | Status: AC

## 2018-04-07 MED FILL — Potassium Chloride Inj 2 mEq/ML: INTRAVENOUS | Qty: 40 | Status: AC

## 2018-04-07 MED FILL — Magnesium Sulfate Inj 50%: INTRAMUSCULAR | Qty: 10 | Status: AC

## 2018-04-07 MED FILL — Lidocaine HCl(Cardiac) IV PF Soln Pref Syr 100 MG/5ML (2%): INTRAVENOUS | Qty: 5 | Status: AC

## 2018-04-07 MED FILL — Heparin Sodium (Porcine) Inj 1000 Unit/ML: INTRAMUSCULAR | Qty: 30 | Status: AC

## 2018-04-07 NOTE — Plan of Care (Signed)
  Problem: Nutrition: Goal: Adequate nutrition will be maintained Outcome: Progressing   Problem: Coping: Goal: Level of anxiety will decrease Outcome: Progressing   Problem: Pain Managment: Goal: General experience of comfort will improve Outcome: Progressing   Problem: Safety: Goal: Ability to remain free from injury will improve Outcome: Progressing   Problem: Skin Integrity: Goal: Risk for impaired skin integrity will decrease Outcome: Progressing   Problem: Clinical Measurements: Goal: Respiratory complications will improve Outcome: Not Progressing

## 2018-04-07 NOTE — Op Note (Signed)
NAME: Jeremy King, Jeremy King MEDICAL RECORD ZO:[id number] ACCOUNT [id number] DATE OF BIRTH:Dec 24, 1950 FACILITY: [facility] LOCATION: [facility] PHYSICIAN:Dehaven Sine VAN TRIGT III, MD  OPERATIVE REPORT  DATE OF PROCEDURE:  04/06/2018  PROCEDURE PERFORMED: 1.  Coronary artery bypass grafting x4 (left internal mammary artery to LAD, saphenous vein graft to diagonal, saphenous vein graft to ramus intermedius, saphenous vein graft to posterior descending). 2.  Bilateral pulmonary vein isolation--maze procedure. 3.  Placement of left atrial clip--35 mm AtriCure device. 4.  Endoscopic harvest of right leg greater saphenous vein.  SURGEON:  Kerin Perna III, MD  ASSISTANT:  Lowella Dandy, PA-C  ANESTHESIA:  General.  PREOPERATIVE DIAGNOSES:  Severe 3-vessel coronary artery disease, ischemic cardiomyopathy with ejection fraction of 30%, history of chronic atrial fibrillation, chronic obstructive pulmonary disease with active smoking.  POSTOPERATIVE DIAGNOSES:  Severe 3-vessel coronary artery disease, ischemic cardiomyopathy with ejection fraction of 30%, history of chronic atrial fibrillation, chronic obstructive pulmonary disease with active smoking.  ANESTHESIA:  General by Dr. Claybon Jabs.  DESCRIPTION OF PROCEDURE:  After the patient had been evaluated in the office and his preoperative studies reviewed in detail and the procedure of CABG and maze procedure discussed in detail with the patient including the expected benefits, alternatives  and risks (stroke, bleeding, blood transfusion, postoperative pulmonary problems including pleural effusion and prolonged ventilator requirement, postoperative organ failure, death), he demonstrated his understanding and agreed to proceed with surgery  under what I felt was an informed consent.  The patient was brought to the operating room and placed supine on the operating table, and general anesthesia was induced under invasive hemodynamic monitoring.  A  transesophageal echo probe was placed by the anesthesia team.  This demonstrated  moderate-to-severe LV dysfunction with mild MR, mild AI.  He was prepped and draped as a sterile field, and a proper time-out was performed.  A sternal incision was made as the saphenous vein was harvested from the right leg.  The left internal mammary  artery was harvested as a pedicle graft from its origin at the subclavian vessels.  The lungs were examined and found to have evidence of severe COPD with active inflammation.  The sternal retractor was placed, and the pericardium was opened and suspended.  Pursestrings were placed in the ascending aorta and right atrium, and after heparin was administered and the ACT was therapeutic, the patient was cannulated and placed on  cardiopulmonary bypass.  The coronaries were identified for grafting.  The LAD, diagonal, ramus and posterior descending were found to be adequate targets.  The distal circumflex was too small to graft.  The mammary artery and vein grafts were prepared for the distal anastomoses, and cardioplegic cannulas were placed both antegrade and retrograde cold blood cardioplegia.  The patient was cooled to 32 degrees, and the aortic cross-clamp was applied.  One  liter of cold blood cardioplegia was delivered in split doses between the antegrade aortic and retrograde coronary sinus catheters.  There was good cardioplegic arrest, and supple temperature dropped to less than 14 degrees.  Cardioplegia was delivered  every 20 minutes.  The distal coronary anastomoses were performed after the maze procedure was performed.  First, the left pulmonary veins were exposed, and the ligament between the left PA and the left superior vein was dissected and a vessel loop placed around the left atrial cuff.  Using the radiofrequency ablation clamp, 3 ablation lines were placed  around the left-sided pulmonary veins.  Then, attention was directed to the right-sided pulmonary  veins.  The right superior pulmonary vein was dissected from the right pulmonary artery, and the inferior pulmonary vein was dissected out as well.  Two ablation lines were placed around each  pulmonary vein for a total of 4 ablation lines around the left atrial cuff on the right side of the heart.  The ablation device was then removed.  Next, the atrial appendage was clipped with a 35 mm AtriCure device and secured.  Next, the distal coronary anastomoses were performed.  The first distal anastomosis was the distal RCA at the takeoff of the posterior descending.  There was a 1.5 mm vessel, proximal 80% stenosis.  A reverse saphenous vein was sewn end-to-side with  running 7-0 Prolene with good flow through the graft.  Cardioplegia was redosed.  The second distal anastomosis was to the ramus intermedius branch of the left coronary.  This had approximately 80% stenosis.  A reverse saphenous vein was sewn with a running 7-0 Prolene with good flow through the graft.  Cardioplegia was redosed.  A third distal anastomosis was to the first diagonal branch of LAD.  There is an ostial 80% to 90% stenosis.  A reverse saphenous vein was sewn end-to-side with running 7-0 Prolene with good flow through the graft.  Cardioplegia was redosed.  The fourth distal anastomosis was the distal third of the LAD.  The left IMA pedicle was brought through an opening in the left lateral pericardium.  It was brought down onto the LAD and sewn end-to-side with a running 8-0 Prolene.  There was good flow  through the anastomosis after briefly releasing the pedicle bulldog on the mammary artery.  The bulldog was reapplied, and the pedicle clamp replaced.  The pedicle was secured to the epicardium with 6-0 Prolenes.  Cardioplegia was redosed.  While the cross-clamp was still in place, 3 proximal vein anastomoses were performed on the ascending aorta with a 4.5 mm punch and running 6-0 Prolene.  Prior to removing the cross-clamp,  the aorta was vented from the coronaries with a dose of  retrograde warm blood cardioplegia and the usual deairing maneuvers on bypass.  The crossclamp was removed.  The vein grafts were deaired and opened, and each had good flow.  Hemostasis was documented at the proximal and distal sites.  The patient was rewarmed and reperfused.  Temporary pacing wires were applied.  The lungs were expanded, and the ventilator was  resumed.  The patient was started on low-dose inotropic support.  After adequate reperfusion, he was weaned from cardiopulmonary bypass with improved global LV function.  He was in a sinus rhythm.  He was atrially paced at 90 per minute.  His cardiac  output was 4.8 L, and hemodynamics were stable.  Protamine was administered without adverse reaction.  The cannulas were removed.  The patient still had significant coagulopathy.  He required FFP with some improvement in coagulopathy.  He required  platelets with further improvement in coagulopathy.  When hemostasis was adequate, the mediastinum was closed superiorly over the aorta and vein grafts, and mediastinal and left pleural chest tubes were placed.  The sternum was closed with a wire.  The  pectoralis fascia was closed with a running #1 Vicryl.  The subcutaneous and skin layers were closed with a running Vicryl.  Total cardiopulmonary bypass time was 148 minutes.  LN/NUANCE  D:04/07/2018 T:04/07/2018 JOB:[id number]

## 2018-04-07 NOTE — Discharge Summary (Signed)
Physician Discharge Summary  Patient ID: Jeremy King MRN: [id number] DOB/AGE: 69-Jun-1952 69 y.o.  Admit date: 04/06/2018 Discharge date: 04/15/2018  Admission Diagnoses:  Patient Active Problem List   Diagnosis Date Noted  . Coronary artery disease involving native coronary artery of native heart without angina pectoris   . Chronic systolic HF (heart failure) (HCC)   . Abnormal cardiovascular stress test 03/09/2018  . Rash 01/27/2018  . Subluxation of tendon of long head of biceps 10/02/2017  . Rotator cuff arthropathy of left shoulder 09/03/2017  . Bradycardia 09/19/2016  . Other fatigue 04/18/2015  . Excessive daytime sleepiness 04/18/2015  . Atrial fibrillation (HCC) 12/29/2014  . Persistent atrial fibrillation (HCC) 11/26/2014  . Syncope and collapse   . Hyperlipidemia 04/21/2007  . Anxiety state 04/21/2007   Discharge Diagnoses:   Patient Active Problem List   Diagnosis Date Noted  . 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 Coronary artery disease involving native coronary artery of native heart without angina pectoris   . Chronic systolic HF (heart failure) (HCC)   . Abnormal cardiovascular stress test 03/09/2018  . Rash 01/27/2018  . Subluxation of tendon of long head of biceps 10/02/2017  . Rotator cuff arthropathy of left shoulder 09/03/2017  . Bradycardia 09/19/2016  . Other fatigue 04/18/2015  . Excessive daytime sleepiness 04/18/2015  . Atrial fibrillation (HCC) 12/29/2014  . Persistent atrial fibrillation (HCC) 11/26/2014  . Syncope and collapse   . Hyperlipidemia 04/21/2007  . Anxiety state 04/21/2007   Discharged Condition: good  History of Present Illness:   Mr. Jeremy King is a 67 yo male with history of nicotine abuse, hypertension,  and persistent Atrial Fibrillation on Eliquis.  The patient has a history of multiple syncopal episodes in the past.  His most recent one [facility].  During that stay workup included and Echocardiogram which  showed preserved LV function.  He was scheduled for follow up with Dr. Rennis King who placed patient on event monitor for syncopal episodes.  This showed a lot of Atrial Fibrillation with RVR.  In office he was noted to be orthostatic as he gets dizzy after sitting up from and EKG.  He underwent cardioversion which was successful and the patient was maintaining sinus bradycardia.  However in subsequent follow up visits he was noted to be back in Atrial Fibrillation.    It was felt stress testing should be performed.  This was ultimately positive for a reduced EF and septal and inferior wall ischemia.  It was felt catheterization should be performed, as underlying coronary disease could be attributing to him being unable to maintain NSR.  This was performed on 03/12/2018 and showed multivessel CAD.  It was felt coronary bypass grafting would be indicated.  He was referred to [facility].  He was evaluated by Dr. Donata ClayVan King who was in agreement the patient would benefit from coronary bypass grafting.  The risks and benefits of the procedure were explained to the patient and he was agreeable to proceed.       Hospital Course:   Mr. Jeremy King presented to [facility] on 04[facility]06/[facility].  He was taken to the operating room and underwent CABG x 4 utilizing LIMA to LAD, SVG to PDA, SVG to Ramus Intermediate, SVG to Diagonal.  He underwent MAZE procedure consisting of right and left pulmonary vein isolation with radiofrequency ablation and clipping of the LA Appendage.  Finally he underwent endoscopic harvest of greater saphenous vein from his  right leg.  He tolerated the procedure without difficulty and was taken to the SICU in stable condition.  The patient was extubated the evening of surgery.  During his stay in the SICU the patient was weaned off Dopamine, Milrinone, and Neo-synephrine drip as tolerated.  He was experiencing brief episodes of Atrial Fibrillation and was started on oral Amiodarone.  He  ultimately converted to Atrial Fibrillation.  His chest tubes and arterial lines were removed without difficulty.  He developed pulmonary edema and started on IV Lasix for this.  He has severe underlying COPD and was started on IV Steroids.  He had some improvement in his CXR but was ultimately started on Metolazone for several doses with improvement of volume status.  His potassium was supplemented.  He remains in rate controlled Atrial Fibrillation.  He was transferred to the telemetry unit in stable condition on 04/13/2018.  His pacing wires have been removed without difficulty.  He has been restarted on his home regimen of Eliquis.  His BP remains labile and he has not been started on a BB due to this.  He continues to ambulate independently.  His incisions are healing without evidence of infection.  He is medically stable for discharge home today.     Significant Diagnostic Studies: angiography:    Severe three-vessel CAD.  Right dominant coronary anatomy.  Moderate diffuse calcification throughout the left coronary.  Severe proximal to mid LAD 75 to 85%.  Severe obstruction in diagonal #1 and diagonal #2, both of which are graftable.  Separate ostium of the circumflex.  Small territory covered.  Tiny first obtuse marginal 90%.  Severe diffuse disease proximal to mid RCA 80%.  Global left ventricular hypokinesis with EF in the 40 to 45% range.  Normal LVEDP.  Findings compatible with chronic combined systolic and diastolic heart failure.  Recent syncope of uncertain etiology.  Persistent atrial fibrillation on long-term anticoagulation therapy.  RECOMMENDATIONS:   Referred to TCTS for consideration of surgical revascularization, left atrial appendage ligation, and surgical maze procedure.  Resume anticoagulation in a.m.  Treatments: surgery:   Discharge Exam: Blood pressure 108/62, pulse 85, temperature 98.3 F (36.8 C), resp. rate (!) 25, height 6' (1.829 m), weight 209 lb 14.1 oz  (95.2 kg), SpO2 98 %.  General appearance: alert, cooperative and no distress Heart: regular rate and rhythm Lungs: clear to auscultation bilaterally Abdomen: soft, non-tender; bowel sounds normal; no masses,  no organomegaly Extremities: edema trace Wound: clean and dry   Discharge disposition: 01-Home or Self Care  Discharge Medications:  The patient has been discharged on:   1.Beta Blocker:  Yes [   ]                              No   [ X  ]                              If No, reason:labile BP  2.Ace Inhibitor/ARB: Yes [   ]                                     No  Arly.Keller    ]  If No, reason: labile BP  3.Statin:   Yes Arly.Keller   ]                  No  [   ]                  If No, reason:  4.Ecasa:  Yes  [ X  ]                  No   [   ]                  If No, reason:      Allergies as of 04/15/2018      Reactions   Sertraline Hcl Other (See Comments)   Extreme headaches   Trazodone And Nefazodone Other (See Comments)   Irregular heart beat? Hypotension?      Medication List    STOP taking these medications   ibuprofen 200 MG tablet Commonly known as:  ADVIL,MOTRIN   metoprolol tartrate 25 MG tablet Commonly known as:  LOPRESSOR   MULTAQ 400 MG tablet Generic drug:  dronedarone     TAKE these medications   amiodarone 400 MG tablet Commonly known as:  PACERONE Take 1 tablet (400 mg total) by mouth 2 (two) times daily. X 7 days, then decrease to 400 mg daily   apixaban 5 MG Tabs tablet Commonly known as:  ELIQUIS Take 1 tablet (5 mg total) by mouth 2 (two) times daily. Schedule MD appt for further refills   ARTIFICIAL TEARS OP Place 1 drop into both eyes 3 (three) times daily as needed (for dry eyes).   aspirin 81 MG EC tablet Take 1 tablet (81 mg total) by mouth daily.   atorvastatin 40 MG tablet Commonly known as:  LIPITOR Take 1 tablet (40 mg total) by mouth daily at 6 PM.   clonazePAM 1 MG tablet Commonly  known as:  KLONOPIN Take 1 tablet (1 mg total) by mouth 2 (two) times daily as needed. for anxiety What changed:    when to take this  additional instructions   digoxin 0.125 MG tablet Commonly known as:  LANOXIN Take 1 tablet (0.125 mg total) by mouth daily.   Fish Oil 1000 MG Caps Take 1,000 mg by mouth 2 (two) times daily.   Garlic 1000 MG Caps Take 1,000 mg by mouth daily.   Melatonin 1 MG Tabs Take 2 mg by mouth at bedtime.   multivitamin with minerals Tabs tablet Take 1 tablet by mouth daily. One-A-Day Men's   traMADol 50 MG tablet Commonly known as:  ULTRAM Take 1 tablet (50 mg total) by mouth every 6 (six) hours as needed for moderate pain.   triamcinolone cream 0.1 % Commonly known as:  KENALOG Apply 1 application topically 2 (two) times daily. What changed:    when to take this  reasons to take this   venlafaxine XR 150 MG 24 hr capsule Commonly known as:  EFFEXOR-XR TAKE 1 CAPSULE(150 MG) BY MOUTH DAILY WITH BREAKFAST   vitamin C 1000 MG tablet Take 1,000 mg by mouth daily.      Follow-up Information    Kerin Perna, MD Follow up on 05/13/2018.   Specialty:  Cardiothoracic Surgery Why:  Appointment is at 11:00, please get CXR at 10:30 at [facility] located on first floor of our office building Contact information: [address] [place] [address] [phone]  Signed: Lowella Dandy 04/15/2018, 7:37 AM

## 2018-04-07 NOTE — Progress Notes (Addendum)
TCTS DAILY ICU PROGRESS NOTE                   [address]            [place] [address]          [phone]   1 Day Post-Op Procedure(s) (LRB): CORONARY ARTERY BYPASS GRAFTING (CABG) x 4 WITH ENDOSCOPIC HARVESTING OF RIGHT SAPHENOUS VEIN (N/A) MAZE (N/A) TRANSESOPHAGEAL ECHOCARDIOGRAM (TEE) (N/A) CLIPPING OF ATRIAL APPENDAGE USING ATRICURE ATRICLIP ACHV40 (Left)  Total Length of Stay:  LOS: 1 day   Subjective:  Patient complains of pain this morning.  States he is getting much relief with pain medication and its very painful when he breathes.  He denies nausea/vomiting  Objective: Vital signs in last 24 hours: Temp:  [96.1 F (35.6 C)-99.5 F (37.5 C)] 99.3 F (37.4 C) (07/02 0700) Resp:  [8-23] 21 (07/02 0700) BP: (96-113)/(63-82) 96/65 (07/02 0700) SpO2:  [91 %-100 %] 92 % (07/02 0700) Arterial Line BP: (99-139)/(51-70) 110/51 (07/02 0700) FiO2 (%):  [40 %-50 %] 40 % (07/01 1742) Weight:  [220 lb 3.8 oz (99.9 kg)-220 lb 10.9 oz (100.1 kg)] 220 lb 10.9 oz (100.1 kg) (07/02 0500)  Filed Weights   04/06/18 1600 04/07/18 0500  Weight: 220 lb 3.8 oz (99.9 kg) 220 lb 10.9 oz (100.1 kg)    Weight change:    Hemodynamic parameters for last 24 hours: PAP: (20-39)/(8-22) 26/13 CO:  [6.3 L/min-6.5 L/min] 6.5 L/min CI:  [2.9 L/min/m2] 2.9 L/min/m2  Intake/Output from previous day: 07/01 0701 - 07/02 0700 In: 6348.6 [I.V.:4058.7; ZJQBH:4193; IV Piggyback:847.9] Out: 7902 [Urine:2830; Chest Tube:260]  Current Meds: Scheduled Meds: . acetaminophen  1,000 mg Oral Q6H   Or  . acetaminophen (TYLENOL) oral liquid 160 mg/5 mL  1,000 mg Per Tube Q6H  . aspirin EC  325 mg Oral Daily   Or  . aspirin  324 mg Per Tube Daily  . bisacodyl  10 mg Oral Daily   Or  . bisacodyl  10 mg Rectal Daily  . chlorhexidine gluconate (MEDLINE KIT)  15 mL Mouth Rinse BID  . Chlorhexidine Gluconate Cloth  6 each Topical Daily  . docusate sodium  200 mg Oral Daily  .  guaiFENesin  600 mg Oral BID  . insulin regular  0-10 Units Intravenous TID WC  . levalbuterol  1.25 mg Nebulization TID  . mouth rinse  15 mL Mouth Rinse 10 times per day  . metoCLOPramide (REGLAN) injection  10 mg Intravenous Q6H  . metoprolol tartrate  12.5 mg Oral BID   Or  . metoprolol tartrate  12.5 mg Per Tube BID  . mometasone-formoterol  2 puff Inhalation BID  . multivitamin with minerals  1 tablet Oral Daily  . omega-3 acid ethyl esters  1 g Oral BID  . [START ON 04/08/2018] pantoprazole  40 mg Oral Daily  . sodium chloride flush  10-40 mL Intracatheter Q12H  . sodium chloride flush  3 mL Intravenous Q12H  . venlafaxine XR  150 mg Oral Q breakfast  . vitamin C  1,000 mg Oral Daily   Continuous Infusions: . sodium chloride 20 mL/hr at 04/07/18 0700  . sodium chloride Stopped (04/07/18 0620)  . sodium chloride 20 mL/hr at 04/07/18 0323  . albumin human 250 mL (04/06/18 2017)  . cefUROXime (ZINACEF)  IV Stopped (04/06/18 2151)  . dexmedetomidine (PRECEDEX) IV infusion Stopped (04/06/18 1645)  . DOPamine 2.5 mcg/kg/min (04/07/18 0700)  . insulin (NOVOLIN-R)  infusion 1.7 mL/hr at 04/07/18 0700  . lactated ringers    . lactated ringers 20 mL/hr at 04/07/18 0700  . milrinone 0.25 mcg/kg/min (04/07/18 0700)  . nitroGLYCERIN Stopped (04/06/18 1555)  . phenylephrine (NEO-SYNEPHRINE) Adult infusion Stopped (04/07/18 0442)   PRN Meds:.sodium chloride, albumin human, clonazePAM, metoprolol tartrate, midazolam, morphine injection, ondansetron (ZOFRAN) IV, oxyCODONE, polyvinyl alcohol, sodium chloride flush, sodium chloride flush, traMADol, triamcinolone cream  General appearance: alert, cooperative and no distress Heart: regular rate and rhythm Lungs: crackles throughout Abdomen: soft, non-tender; bowel sounds normal; no masses,  no organomegaly Extremities: edema trace Wound: aquacel on sternotomy, RLE clean and dry, ecchymosis of right leg present  Lab Results: CBC: Recent Labs     04/06/18 1950 04/06/18 2029 04/07/18 0427  WBC 16.8*  --  15.9*  HGB 11.9* 11.2* 11.0*  HCT 35.8* 33.0* 32.8*  PLT 169  --  149*   BMET:  Recent Labs    04/06/18 2029 04/07/18 0427  NA 142 137  K 4.0 4.0  CL 106 111  CO2  --  23  GLUCOSE 132* 111*  BUN 13 14  CREATININE 0.70 0.87  CALCIUM  --  7.6*    CMET: Lab Results  Component Value Date   WBC 15.9 (H) 04/07/2018   HGB 11.0 (L) 04/07/2018   HCT 32.8 (L) 04/07/2018   PLT 149 (L) 04/07/2018   GLUCOSE 111 (H) 04/07/2018   CHOL 188 10/17/2017   TRIG 239.0 (H) 10/17/2017   HDL 37.00 (L) 10/17/2017   LDLDIRECT 143.0 10/17/2017   LDLCALC 149 (H) 11/27/2014   ALT 18 04/02/2018   AST 18 04/02/2018   NA 137 04/07/2018   K 4.0 04/07/2018   CL 111 04/07/2018   CREATININE 0.87 04/07/2018   BUN 14 04/07/2018   CO2 23 04/07/2018   TSH 0.724 03/09/2018   PSA 3.01 04/03/2007   INR 1.37 04/06/2018   HGBA1C 5.3 04/02/2018      PT/INR:  Recent Labs    04/06/18 1435  LABPROT 16.8*  INR 1.37   Radiology: Dg Chest Port 1 View  Result Date: 04/07/2018 CLINICAL DATA:  Status post bypass EXAM: PORTABLE CHEST 1 VIEW COMPARISON:  04/06/2018 FINDINGS: Interval extubation and removal of NG tube. Swan-Ganz catheter and left chest tube remain in place, unchanged. No pneumothorax. Changes of CABG. Mild cardiomegaly. Increasing vascular congestion and perihilar and lower lobe airspace opacities which could reflect edema and/or atelectasis. Probable small effusions. IMPRESSION: Cardiomegaly. Increasing vascular congestion and perihilar and lower lobe opacities which could reflect edema and/or atelectasis. Electronically Signed   By: Rolm Baptise M.D.   On: 04/07/2018 07:20   Dg Chest Port 1 View  Result Date: 04/06/2018 CLINICAL DATA:  Status post coronary artery bypass graft. EXAM: PORTABLE CHEST 1 VIEW COMPARISON:  Radiographs of April 02, 2018. FINDINGS: Stable cardiomediastinal silhouette. Status post coronary artery bypass  graft. Endotracheal tube and nasogastric tube are in good position. Left-sided chest tube is noted without pneumothorax. Right internal jugular Swan-Ganz catheter is seen with tip directed into right pulmonary artery. Mild central pulmonary vascular congestion is noted. No pneumothorax or significant pleural effusion is noted. Mild bibasilar subsegmental atelectasis is noted. Bony thorax is unremarkable. IMPRESSION: Endotracheal and nasogastric tubes are in grossly good position. Left-sided chest tube is noted without pneumothorax. Mild bibasilar subsegmental atelectasis is noted. Electronically Signed   By: Marijo Conception, M.D.   On: 04/06/2018 15:12     Assessment/Plan: S/P Procedure(s) (LRB): CORONARY ARTERY BYPASS GRAFTING (  CABG) x 4 WITH ENDOSCOPIC HARVESTING OF RIGHT SAPHENOUS VEIN (N/A) MAZE (N/A) TRANSESOPHAGEAL ECHOCARDIOGRAM (TEE) (N/A) CLIPPING OF ATRIAL APPENDAGE USING ATRICURE ATRICLIP ACHV40 (Left)  1. CV- NSR, BP is in the 90s- on Dopamine, Neosynephrine, Milrinone- can possibly d/c Dopamine today, wean Milrinone and Neo as hemodynamics allow 2. Pulm- CXR shows evidence of atelectasis, pulmonary edema, only 400 cc chest tube output since surgery can likely remove chest tubes today, need to continue IS 3. Renal- creatinine is WNL, weight is stable, would benefit from Lasix with edema on CXR however with labile BP he would likely not tolerate this morning, may be able to administer later today, K is okay 4. Expected post operative blood loss anemia, mild Hgb is 11.0 5. Expected thrombocytopenia, mild Plt at 149 6. CBGs are controlled, patient is not a diabetic, will d/c insulin drip and start SSIP 7. Dispo- patient stable, likely d/c dopamine today, continue Milrinone, Neo and wean as hemodynamics allow, likely d/c chest tubes, POD #1 progression orders   Ellwood Handler 04/07/2018 7:33 AM  Initially nsr after MAZE - now afib. Start iv amiodarone patient examined and medical record  reviewed,agree with above note. Tharon Aquas Trigt III 04/07/2018

## 2018-04-07 NOTE — Progress Notes (Signed)
Patient ID: Jeremy King, male   DOB: Sep 19, 1951, 67 y.o.   MRN: [id number] TCTS Evening Rounds:  He has had borderline BP all day. Dopamine turned off this am and remained on milrinone 0.25. Went into atrial fib with RVR this afternoon and started on amio. Rate still 120's -130's so will give him a dose of digoxin since BP low and will not tolerated further amio bolus at this time. Dopamine 2.5 reordered by PVT to support BP and aid urine output.  Urine output low at 15 cc/hr the past few hrs.   Sats have been in the low 90's on HFNC 12L. ABG shows metabolic acidosis -5. PCO2 39, PO2 59, sat 89%. Will correct acidosis with some bicarb then repeat ABG and Co-ox. Breath sounds present throughout with few rales. He may need bipap. CXR this am showed some pulmonary interstitial edema.  He is awake and alert and says he feels ok.

## 2018-04-08 ENCOUNTER — Inpatient Hospital Stay (HOSPITAL_COMMUNITY): Payer: Medicare Other

## 2018-04-08 LAB — POCT I-STAT 3, ART BLOOD GAS (G3+)
Acid-Base Excess: 1 mmol/L (ref 0.0–2.0)
Acid-base deficit: 2 mmol/L (ref 0.0–2.0)
Bicarbonate: 25.1 mmol/L (ref 20.0–28.0)
Bicarbonate: 22.9 mmol/L (ref 20.0–28.0)
Bicarbonate: 25.3 mmol/L (ref 20.0–28.0)
O2 Saturation: 96 %
O2 Saturation: 93 %
O2 Saturation: 97 %
Patient temperature: 98.2
Patient temperature: 98.2
TCO2: 26 mmol/L (ref 22–32)
TCO2: 24 mmol/L (ref 22–32)
TCO2: 26 mmol/L (ref 22–32)
pCO2 arterial: 41.2 mmHg (ref 32.0–48.0)
pCO2 arterial: 37.4 mmHg (ref 32.0–48.0)
pCO2 arterial: 39.5 mmHg (ref 32.0–48.0)
pH, Arterial: 7.392 (ref 7.350–7.450)
pH, Arterial: 7.394 (ref 7.350–7.450)
pH, Arterial: 7.414 (ref 7.350–7.450)
pO2, Arterial: 82 mmHg — ABNORMAL LOW (ref 83.0–108.0)
pO2, Arterial: 67 mmHg — ABNORMAL LOW (ref 83.0–108.0)
pO2, Arterial: 84 mmHg (ref 83.0–108.0)

## 2018-04-08 LAB — GLUCOSE, CAPILLARY
Glucose-Capillary: 138 mg/dL — ABNORMAL HIGH (ref 70–99)
Glucose-Capillary: 172 mg/dL — ABNORMAL HIGH (ref 70–99)
Glucose-Capillary: 208 mg/dL — ABNORMAL HIGH (ref 70–99)
Glucose-Capillary: 200 mg/dL — ABNORMAL HIGH (ref 70–99)
Glucose-Capillary: 188 mg/dL — ABNORMAL HIGH (ref 70–99)

## 2018-04-08 LAB — CBC
HCT: 30.4 % — ABNORMAL LOW (ref 39.0–52.0)
Hemoglobin: 10 g/dL — ABNORMAL LOW (ref 13.0–17.0)
MCH: 32.5 pg (ref 26.0–34.0)
MCHC: 32.9 g/dL (ref 30.0–36.0)
MCV: 98.7 fL (ref 78.0–100.0)
Platelets: 111 10*3/uL — ABNORMAL LOW (ref 150–400)
RBC: 3.08 MIL/uL — ABNORMAL LOW (ref 4.22–5.81)
RDW: 13 % (ref 11.5–15.5)
WBC: 14.8 10*3/uL — ABNORMAL HIGH (ref 4.0–10.5)

## 2018-04-08 LAB — CBC WITH DIFFERENTIAL/PLATELET
Basophils Absolute: 0 10*3/uL (ref 0.0–0.1)
Basophils Relative: 0 %
Eosinophils Absolute: 0 10*3/uL (ref 0.0–0.7)
Eosinophils Relative: 0 %
HCT: 30.8 % — ABNORMAL LOW (ref 39.0–52.0)
Hemoglobin: 10.1 g/dL — ABNORMAL LOW (ref 13.0–17.0)
Lymphocytes Relative: 2 %
Lymphs Abs: 0.4 10*3/uL — ABNORMAL LOW (ref 0.7–4.0)
MCH: 32.5 pg (ref 26.0–34.0)
MCHC: 32.8 g/dL (ref 30.0–36.0)
MCV: 99 fL (ref 78.0–100.0)
Monocytes Absolute: 0.7 10*3/uL (ref 0.1–1.0)
Monocytes Relative: 4 %
Neutro Abs: 14.3 10*3/uL — ABNORMAL HIGH (ref 1.7–7.7)
Neutrophils Relative %: 92 %
Platelets: 117 10*3/uL — ABNORMAL LOW (ref 150–400)
RBC: 3.11 MIL/uL — ABNORMAL LOW (ref 4.22–5.81)
RDW: 13.1 % (ref 11.5–15.5)
WBC: 15.5 10*3/uL — ABNORMAL HIGH (ref 4.0–10.5)

## 2018-04-08 LAB — COOXEMETRY PANEL
Carboxyhemoglobin: 0.9 % (ref 0.5–1.5)
Methemoglobin: 1.5 % (ref 0.0–1.5)
O2 Saturation: 89.9 %
Total hemoglobin: 10.3 g/dL — ABNORMAL LOW (ref 12.0–16.0)

## 2018-04-08 LAB — BASIC METABOLIC PANEL
Anion gap: 7 (ref 5–15)
BUN: 17 mg/dL (ref 8–23)
CO2: 24 mmol/L (ref 22–32)
Calcium: 8.2 mg/dL — ABNORMAL LOW (ref 8.9–10.3)
Chloride: 105 mmol/L (ref 98–111)
Creatinine, Ser: 1.11 mg/dL (ref 0.61–1.24)
GFR calc Af Amer: >60 mL/min (ref >60)
GFR calc non Af Amer: >60 mL/min (ref >60)
Glucose, Bld: 124 mg/dL — ABNORMAL HIGH (ref 70–99)
Potassium: 3.9 mmol/L (ref 3.5–5.1)
Sodium: 136 mmol/L (ref 135–145)

## 2018-04-08 LAB — POCT I-STAT, CHEM 8
BUN: 14 mg/dL (ref 8–23)
Calcium, Ion: 1.14 mmol/L — ABNORMAL LOW (ref 1.15–1.40)
Chloride: 99 mmol/L (ref 98–111)
Creatinine, Ser: 0.9 mg/dL (ref 0.61–1.24)
Glucose, Bld: 205 mg/dL — ABNORMAL HIGH (ref 70–99)
HCT: 30 % — ABNORMAL LOW (ref 39.0–52.0)
Hemoglobin: 10.2 g/dL — ABNORMAL LOW (ref 13.0–17.0)
Potassium: 3.6 mmol/L (ref 3.5–5.1)
Sodium: 138 mmol/L (ref 135–145)
TCO2: 21 mmol/L — ABNORMAL LOW (ref 22–32)

## 2018-04-08 LAB — MAGNESIUM: Magnesium: 2.1 mg/dL (ref 1.7–2.4)

## 2018-04-08 LAB — CREATININE, SERUM
Creatinine, Ser: 1.14 mg/dL (ref 0.61–1.24)
GFR calc Af Amer: >60 mL/min (ref >60)
GFR calc non Af Amer: >60 mL/min (ref >60)

## 2018-04-08 MED ORDER — POTASSIUM CHLORIDE 10 MEQ/50ML IV SOLN
10.0000 meq | INTRAVENOUS | Status: AC
  Administered 2018-04-08 (×3): 10 meq via INTRAVENOUS
  Filled 2018-04-08 (×3): qty 50

## 2018-04-08 MED ORDER — DIGOXIN 0.25 MG/ML IJ SOLN
0.1250 mg | Freq: Every day | INTRAMUSCULAR | Status: DC
  Administered 2018-04-08 – 2018-04-10 (×3): 0.125 mg via INTRAVENOUS
  Filled 2018-04-08 (×3): qty 2

## 2018-04-08 MED ORDER — SODIUM CHLORIDE 0.9 % IV SOLN
1.0000 g | Freq: Three times a day (TID) | INTRAVENOUS | Status: DC
  Administered 2018-04-08 – 2018-04-15 (×21): 1 g via INTRAVENOUS
  Filled 2018-04-08 (×22): qty 1

## 2018-04-08 MED ORDER — FUROSEMIDE 10 MG/ML IJ SOLN
20.0000 mg | Freq: Two times a day (BID) | INTRAMUSCULAR | Status: DC
  Administered 2018-04-08 – 2018-04-09 (×2): 20 mg via INTRAVENOUS
  Filled 2018-04-08 (×2): qty 2

## 2018-04-08 MED ORDER — NICOTINE 14 MG/24HR TD PT24
14.0000 mg | MEDICATED_PATCH | Freq: Every day | TRANSDERMAL | Status: DC
  Administered 2018-04-08 – 2018-04-15 (×8): 14 mg via TRANSDERMAL
  Filled 2018-04-08 (×8): qty 1

## 2018-04-08 MED ORDER — INSULIN ASPART 100 UNIT/ML ~~LOC~~ SOLN
0.0000 [IU] | Freq: Three times a day (TID) | SUBCUTANEOUS | Status: DC
  Administered 2018-04-08 – 2018-04-09 (×3): 3 [IU] via SUBCUTANEOUS
  Administered 2018-04-09 – 2018-04-10 (×2): 2 [IU] via SUBCUTANEOUS

## 2018-04-08 MED ORDER — FUROSEMIDE 10 MG/ML IJ SOLN
40.0000 mg | Freq: Once | INTRAMUSCULAR | Status: AC
  Administered 2018-04-08: 40 mg via INTRAVENOUS
  Filled 2018-04-08: qty 4

## 2018-04-08 MED ORDER — INSULIN ASPART 100 UNIT/ML ~~LOC~~ SOLN: 0.0000 [IU] | Freq: Every day | SUBCUTANEOUS | Status: DC

## 2018-04-08 MED ORDER — POTASSIUM CHLORIDE CRYS ER 20 MEQ PO TBCR
20.0000 meq | EXTENDED_RELEASE_TABLET | Freq: Every day | ORAL | Status: DC
  Administered 2018-04-08 – 2018-04-09 (×2): 20 meq via ORAL
  Filled 2018-04-08 (×2): qty 1

## 2018-04-08 MED ORDER — METHYLPREDNISOLONE SODIUM SUCC 40 MG IJ SOLR
40.0000 mg | Freq: Two times a day (BID) | INTRAMUSCULAR | Status: AC
  Administered 2018-04-08 – 2018-04-09 (×3): 40 mg via INTRAVENOUS
  Filled 2018-04-08 (×3): qty 1

## 2018-04-08 MED ORDER — AMIODARONE HCL IN DEXTROSE 360-4.14 MG/200ML-% IV SOLN
30.0000 mg/h | INTRAVENOUS | Status: DC
  Administered 2018-04-08 (×2): 60 mg/h via INTRAVENOUS
  Administered 2018-04-09 – 2018-04-10 (×4): 30 mg/h via INTRAVENOUS
  Filled 2018-04-08 (×4): qty 200

## 2018-04-08 NOTE — Progress Notes (Signed)
Patient resting comfortably on high flow nasal cannula upon arrival to room.  Patient was not noted to have any distress.  Patient's only complaint is that it hurts to breath.  Will continue to monitor and wean oxygen as tolerated and use bipap when needed.

## 2018-04-08 NOTE — Care Management Note (Signed)
Case Management Note Donn PieriniKristi Hailyn Zarr RN,BSN [facility] Case Manager  [phone]  Patient Details  Name: Jeremy MoraleKimon R King MRN: [id number] Date of Birth: 1951-02-11  Subjective/Objective:   Pt admitted s/p CABGx4 on 04/06/18                 Action/Plan: PTA pt lived at home, CM to follow for transition of care needs  Expected Discharge Date:                  Expected Discharge Plan:     In-House Referral:     Discharge planning Services  CM Consult  Post Acute Care Choice:    Choice offered to:     DME Arranged:    DME Agency:     HH Arranged:    HH Agency:     Status of Service:  In process, will continue to follow  If discussed at Long Length of Stay Meetings, dates discussed:    Discharge Disposition:   Additional Comments:  Darrold SpanWebster, Vallarie Fei Hall, RN 04/08/2018, 11:13 AM

## 2018-04-08 NOTE — Progress Notes (Signed)
Patient ID: Jeremy King, male   DOB: [birth year], 67 y.o.   MRN: [id number] EVENING ROUNDS NOTE :     [address]       [place] [id number]             [phone]                 2 Days Post-Op Procedure(s) (LRB): CORONARY ARTERY BYPASS GRAFTING (CABG) x 4 WITH ENDOSCOPIC HARVESTING OF RIGHT SAPHENOUS VEIN (N/A) MAZE (N/A) TRANSESOPHAGEAL ECHOCARDIOGRAM (TEE) (N/A) CLIPPING OF ATRIAL APPENDAGE USING ATRICURE ATRICLIP ACHV40 (Left)  Total Length of Stay:  LOS: 2 days  BP 116/77   Pulse (!) 115   Temp 97.7 F (36.5 C) (Oral)   Resp (!) 27   Ht 6' (1.829 m)   Wt 230 lb 13.2 oz (104.7 kg)   SpO2 93%   BMI 31.31 kg/m   .Intake/Output      07/03 0701 - 07/04 0700   P.O. 1444   I.V. (mL/kg) 624.6 (6)   IV Piggyback 155.7   Total Intake(mL/kg) 2224.2 (21.2)   Urine (mL/kg/hr) 1425 (1.1)   Chest Tube    Total Output 1425   Net +799.2         . sodium chloride Stopped (04/07/18 0620)  . amiodarone 30 mg/hr (04/08/18 1800)  . calcium chloride    . cefTAZidime (FORTAZ)  IV Stopped (04/08/18 1335)  . DOPamine 2.5 mcg/kg/min (04/08/18 1800)  . lactated ringers 10 mL/hr (04/08/18 1853)  . milrinone 0.25 mcg/kg/min (04/08/18 1800)  . phenylephrine (NEO-SYNEPHRINE) Adult infusion Stopped (04/08/18 0422)  . potassium chloride 10 mEq (04/08/18 1859)     Lab Results  Component Value Date   WBC 15.5 (H) 04/08/2018   HGB 10.1 (L) 04/08/2018   HCT 30.8 (L) 04/08/2018   PLT 117 (L) 04/08/2018   GLUCOSE 205 (H) 04/08/2018   CHOL 188 10/17/2017   TRIG 239.0 (H) 10/17/2017   HDL 37.00 (L) 10/17/2017   LDLDIRECT 143.0 10/17/2017   LDLCALC 149 (H) 11/27/2014   ALT 18 04/02/2018   AST 18 04/02/2018   NA 138 04/08/2018   K 3.6 04/08/2018   CL 99 04/08/2018   CREATININE 1.14 04/08/2018   BUN 14 04/08/2018   CO2 24 04/08/2018   TSH 0.724 03/09/2018   PSA 3.01 04/03/2007   INR 1.37 04/06/2018   HGBA1C 5.3 04/02/2018    In afib On milrinone and  dopamine  Delight OvensEdward B Merci Walthers MD  Beeper [phone] Office [phone] 04/08/2018 7:16 PM

## 2018-04-08 NOTE — Plan of Care (Signed)
Patient was placed on BIPAP at beginning of shift, tolerating well. Patient denies any increased pain or discomfort at this time.  Monitoring.

## 2018-04-08 NOTE — Progress Notes (Signed)
Results for ABG drawn at 0123. PH: 7.39 Pco2: 41.2 Pa02: 82 Hc03: 25.1  Results Given to RN

## 2018-04-08 NOTE — Progress Notes (Addendum)
[facility] DAILY ICU PROGRESS NOTE                   [address]            [place] [address]          [phone]   2 Days Post-Op Procedure(s) (LRB): CORONARY ARTERY BYPASS GRAFTING (CABG) x 4 WITH ENDOSCOPIC HARVESTING OF RIGHT SAPHENOUS VEIN (N/A) MAZE (N/A) TRANSESOPHAGEAL ECHOCARDIOGRAM (TEE) (N/A) CLIPPING OF ATRIAL APPENDAGE USING ATRICURE ATRICLIP ACHV40 (Left)  Total Length of Stay:  LOS: 2 days   Subjective:  Patient feels short of breath this morning.  He continues to have chest discomfort.  Tolerated liquid breakfast, denies N/V  Objective: Vital signs in last 24 hours: Temp:  [97.6 F (36.4 C)-99.3 F (37.4 C)] 99.3 F (37.4 C) (07/03 0700) Pulse Rate:  [101-117] 115 (07/03 0304) Cardiac Rhythm: Atrial fibrillation (07/02 2000) Resp:  [15-29] 29 (07/03 0600) BP: (73-95)/(49-66) 89/56 (07/02 2000) SpO2:  [66 %-100 %] 94 % (07/03 0600) Arterial Line BP: (70-143)/(35-68) 105/58 (07/03 0600) FiO2 (%):  [50 %-100 %] 50 % (07/02 1922) Weight:  [230 lb 13.2 oz (104.7 kg)] 230 lb 13.2 oz (104.7 kg) (07/03 0500)  Filed Weights   04/06/18 1600 04/07/18 0500 04/08/18 0500  Weight: 220 lb 3.8 oz (99.9 kg) 220 lb 10.9 oz (100.1 kg) 230 lb 13.2 oz (104.7 kg)    Weight change: 10 lb 9.3 oz (4.8 kg)   Hemodynamic parameters for last 24 hours: PAP: (27)/(9) 27/9  Intake/Output from previous day: 07/02 0701 - 07/03 0700 In: 2071.6 [P.O.:240; I.V.:1631.6; IV Piggyback:199.9] Out: 955 [Urine:905; Chest Tube:50]  Current Meds: Scheduled Meds: . acetaminophen  1,000 mg Oral Q6H   Or  . acetaminophen (TYLENOL) oral liquid 160 mg/5 mL  1,000 mg Per Tube Q6H  . aspirin EC  325 mg Oral Daily   Or  . aspirin  324 mg Per Tube Daily  . bisacodyl  10 mg Oral Daily   Or  . bisacodyl  10 mg Rectal Daily  . Chlorhexidine Gluconate Cloth  6 each Topical Daily  . docusate sodium  200 mg Oral Daily  . enoxaparin (LOVENOX) injection  40 mg Subcutaneous QHS  .  furosemide  40 mg Intravenous Once  . guaiFENesin  600 mg Oral BID  . insulin aspart  0-24 Units Subcutaneous Q4H  . levalbuterol  1.25 mg Nebulization TID  . mouth rinse  15 mL Mouth Rinse BID  . metoCLOPramide (REGLAN) injection  10 mg Intravenous Q6H  . mometasone-formoterol  2 puff Inhalation BID  . multivitamin with minerals  1 tablet Oral Daily  . pantoprazole  40 mg Oral Daily  . sodium chloride flush  10-40 mL Intracatheter Q12H  . sodium chloride flush  3 mL Intravenous Q12H  . venlafaxine XR  150 mg Oral Q breakfast  . vitamin C  1,000 mg Oral Daily   Continuous Infusions: . sodium chloride Stopped (04/07/18 0620)  . albumin human 12.5 g (04/08/18 0558)  . albumin human 250 mL (04/07/18 1813)  . amiodarone 30 mg/hr (04/08/18 0600)  . calcium chloride    . cefUROXime (ZINACEF)  IV Stopped (04/07/18 2115)  . DOPamine 2.5 mcg/kg/min (04/08/18 0500)  . lactated ringers 10 mL/hr at 04/08/18 0600  . milrinone 0.25 mcg/kg/min (04/08/18 0600)  . phenylephrine (NEO-SYNEPHRINE) Adult infusion Stopped (04/08/18 0422)   PRN Meds:.albumin human, clonazePAM, fentaNYL (SUBLIMAZE) injection, ondansetron (ZOFRAN) IV, oxyCODONE, polyvinyl alcohol, sodium chloride flush,  sodium chloride flush, traMADol, triamcinolone cream  General appearance: alert, cooperative and no distress Heart: irregularly irregular rhythm Lungs: crackles throughout Abdomen: soft, non-tender; bowel sounds normal; no masses,  no organomegaly Extremities: edema 1+pitting Wound: aquacel on sternum, EVH sites clean and dry, eccyhmosis present  Lab Results: CBC: Recent Labs    04/07/18 1638 04/07/18 1639 04/08/18 0324  WBC 20.0*  --  14.8*  HGB 10.6* 9.9* 10.0*  HCT 32.4* 29.0* 30.4*  PLT 145*  --  111*   BMET:  Recent Labs    04/07/18 0427  04/07/18 1639 04/08/18 0324  NA 137  --  137 136  K 4.0  --  3.9 3.9  CL 111  --  102 105  CO2 23  --   --  24  GLUCOSE 111*  --  161* 124*  BUN 14  --  19 17    CREATININE 0.87   < > 1.30* 1.11  CALCIUM 7.6*  --   --  8.2*   < > = values in this interval not displayed.    CMET: Lab Results  Component Value Date   WBC 14.8 (H) 04/08/2018   HGB 10.0 (L) 04/08/2018   HCT 30.4 (L) 04/08/2018   PLT 111 (L) 04/08/2018   GLUCOSE 124 (H) 04/08/2018   CHOL 188 10/17/2017   TRIG 239.0 (H) 10/17/2017   HDL 37.00 (L) 10/17/2017   LDLDIRECT 143.0 10/17/2017   LDLCALC 149 (H) 11/27/2014   ALT 18 04/02/2018   AST 18 04/02/2018   NA 136 04/08/2018   K 3.9 04/08/2018   CL 105 04/08/2018   CREATININE 1.11 04/08/2018   BUN 17 04/08/2018   CO2 24 04/08/2018   TSH 0.724 03/09/2018   PSA 3.01 04/03/2007   INR 1.37 04/06/2018   HGBA1C 5.3 04/02/2018      PT/INR:  Recent Labs    04/06/18 1435  LABPROT 16.8*  INR 1.37   Radiology: No results found.   Assessment/Plan: S/P Procedure(s) (LRB): CORONARY ARTERY BYPASS GRAFTING (CABG) x 4 WITH ENDOSCOPIC HARVESTING OF RIGHT SAPHENOUS VEIN (N/A) MAZE (N/A) TRANSESOPHAGEAL ECHOCARDIOGRAM (TEE) (N/A) CLIPPING OF ATRIAL APPENDAGE USING ATRICURE ATRICLIP ACHV40 (Left)  1. CV- Atrial Fibrillation- on IV Amiodarone, Digoxin, Milrinone, and Dopamine- remains Hypotensive- would benefit from BB, however unable to start due to blood pressure, patient on Multaq preoperatively will discuss if indicated with Dr. Donata Clay 2. Pulm- pulmonary edema on CXR, no significant pleural effusion, continue IS, nebs 3. Renal- creatinine at 1.11, restarted on Dopamine overnight, IV Lasix ordered to start today, supplement K 4. Expected post operative blood loss anemia, mild hgb at 10.0 5. Expected thrombocytopenia- mild down to 111K will follow 6. CBGs controlled, patient is not a diabetic, will continue SSIP for now 7. Dispo- patient with worsening pulmonary edema this morning, Lasix ordered, BP remains labile unable to start BB, remains in Atrial Fibrillation on Amiodarone, Digoxin has been given, he will require Eliquis  at discharge, will discuss resumption of patients Multaq with Dr. Morton Peters, leave foley in place today with IV diuretics and accurate measurements of I/O, continue current care Erin Barrett 04/08/2018 8:12 AM   Patient continues with postoperative atrial fibrillation after 24 hours of sinus rhythm.  Associated low blood pressure and low urine output.  Urine output and blood pressure improved with low-dose dopamine which we will continue.  Patient with ischemic cardiomyopathy reduced EF and will continue on milrinone.  Chest x-ray today appears wet and patient will be started  on IV Lasix, inhalers, Mucinex, and continued antibiotics for severe preoperative COPD and acute postop lung injury with pulmionary edema.   Short course of IV steroids.  Co-oximetry is satisfactory. We will use Lovenox perioperatively and patient will be started on oral anticoagulation at time of discharge after pacing wires removed.   patient examined and medical record reviewed,agree with above note. [name] 04/08/2018

## 2018-04-09 ENCOUNTER — Inpatient Hospital Stay (HOSPITAL_COMMUNITY): Payer: Medicare Other

## 2018-04-09 LAB — GLUCOSE, CAPILLARY
Glucose-Capillary: 142 mg/dL — ABNORMAL HIGH (ref 70–99)
Glucose-Capillary: 138 mg/dL — ABNORMAL HIGH (ref 70–99)
Glucose-Capillary: 175 mg/dL — ABNORMAL HIGH (ref 70–99)
Glucose-Capillary: 169 mg/dL — ABNORMAL HIGH (ref 70–99)
Glucose-Capillary: 138 mg/dL — ABNORMAL HIGH (ref 70–99)

## 2018-04-09 LAB — POCT I-STAT, CHEM 8
BUN: 18 mg/dL (ref 8–23)
Calcium, Ion: 1.17 mmol/L (ref 1.15–1.40)
Chloride: 94 mmol/L — ABNORMAL LOW (ref 98–111)
Creatinine, Ser: 1 mg/dL (ref 0.61–1.24)
Glucose, Bld: 163 mg/dL — ABNORMAL HIGH (ref 70–99)
HCT: 34 % — ABNORMAL LOW (ref 39.0–52.0)
Hemoglobin: 11.6 g/dL — ABNORMAL LOW (ref 13.0–17.0)
Potassium: 4 mmol/L (ref 3.5–5.1)
Sodium: 136 mmol/L (ref 135–145)
TCO2: 28 mmol/L (ref 22–32)

## 2018-04-09 LAB — COOXEMETRY PANEL
Carboxyhemoglobin: 1 % (ref 0.5–1.5)
Methemoglobin: 1.5 % (ref 0.0–1.5)
O2 Saturation: 77.4 %
Total hemoglobin: 10.9 g/dL — ABNORMAL LOW (ref 12.0–16.0)

## 2018-04-09 LAB — COMPREHENSIVE METABOLIC PANEL
ALT: 28 U/L (ref 0–44)
AST: 47 U/L — AB (ref 15–41)
Albumin: 3.4 g/dL — ABNORMAL LOW (ref 3.5–5.0)
Alkaline Phosphatase: 51 U/L (ref 38–126)
Anion gap: 8 (ref 5–15)
BUN: 14 mg/dL (ref 8–23)
CHLORIDE: 101 mmol/L (ref 98–111)
CO2: 28 mmol/L (ref 22–32)
Calcium: 8.7 mg/dL — ABNORMAL LOW (ref 8.9–10.3)
Creatinine, Ser: 0.97 mg/dL (ref 0.61–1.24)
GFR calc Af Amer: >60 mL/min (ref >60)
Glucose, Bld: 156 mg/dL — ABNORMAL HIGH (ref 70–99)
Potassium: 4.1 mmol/L (ref 3.5–5.1)
SODIUM: 137 mmol/L (ref 135–145)
Total Bilirubin: 1.6 mg/dL — ABNORMAL HIGH (ref 0.3–1.2)
Total Protein: 6.1 g/dL — ABNORMAL LOW (ref 6.5–8.1)

## 2018-04-09 LAB — CBC
HCT: 31.8 % — ABNORMAL LOW (ref 39.0–52.0)
Hemoglobin: 10.6 g/dL — ABNORMAL LOW (ref 13.0–17.0)
MCH: 32.5 pg (ref 26.0–34.0)
MCHC: 33.3 g/dL (ref 30.0–36.0)
MCV: 97.5 fL (ref 78.0–100.0)
Platelets: 138 10*3/uL — ABNORMAL LOW (ref 150–400)
RBC: 3.26 MIL/uL — ABNORMAL LOW (ref 4.22–5.81)
RDW: 13 % (ref 11.5–15.5)
WBC: 18.1 10*3/uL — ABNORMAL HIGH (ref 4.0–10.5)

## 2018-04-09 MED ORDER — POTASSIUM CHLORIDE CRYS ER 20 MEQ PO TBCR
20.0000 meq | EXTENDED_RELEASE_TABLET | Freq: Three times a day (TID) | ORAL | Status: DC
  Administered 2018-04-09 – 2018-04-10 (×5): 20 meq via ORAL
  Filled 2018-04-09 (×5): qty 1

## 2018-04-09 MED ORDER — FUROSEMIDE 10 MG/ML IJ SOLN
20.0000 mg | Freq: Once | INTRAMUSCULAR | Status: AC
  Administered 2018-04-09: 20 mg via INTRAVENOUS

## 2018-04-09 MED ORDER — TRAMADOL HCL 50 MG PO TABS
50.0000 mg | ORAL_TABLET | Freq: Four times a day (QID) | ORAL | Status: DC | PRN
  Administered 2018-04-10 – 2018-04-13 (×2): 50 mg via ORAL
  Filled 2018-04-09 (×2): qty 1

## 2018-04-09 MED ORDER — POTASSIUM CHLORIDE CRYS ER 20 MEQ PO TBCR
20.0000 meq | EXTENDED_RELEASE_TABLET | Freq: Once | ORAL | Status: AC
  Administered 2018-04-09: 20 meq via ORAL
  Filled 2018-04-09: qty 1

## 2018-04-09 MED ORDER — METOLAZONE 5 MG PO TABS
5.0000 mg | ORAL_TABLET | Freq: Once | ORAL | Status: AC
  Administered 2018-04-09: 5 mg via ORAL
  Filled 2018-04-09: qty 1

## 2018-04-09 MED ORDER — LEVALBUTEROL HCL 1.25 MG/0.5ML IN NEBU: 1.2500 mg | INHALATION_SOLUTION | Freq: Four times a day (QID) | RESPIRATORY_TRACT | Status: DC | PRN

## 2018-04-09 MED ORDER — CLONAZEPAM 1 MG PO TABS
1.0000 mg | ORAL_TABLET | Freq: Two times a day (BID) | ORAL | Status: DC
  Administered 2018-04-09 – 2018-04-15 (×12): 1 mg via ORAL
  Filled 2018-04-09 (×12): qty 1

## 2018-04-09 MED ORDER — FUROSEMIDE 10 MG/ML IJ SOLN
INTRAMUSCULAR | Status: AC
  Administered 2018-04-09: 20 mg via INTRAVENOUS
  Filled 2018-04-09: qty 2

## 2018-04-09 MED ORDER — SORBITOL 70 % SOLN
15.0000 mL | Freq: Every day | Status: DC | PRN
  Administered 2018-04-10 – 2018-04-12 (×2): 15 mL via ORAL
  Filled 2018-04-09 (×3): qty 30

## 2018-04-09 MED ORDER — FUROSEMIDE 10 MG/ML IJ SOLN
40.0000 mg | Freq: Two times a day (BID) | INTRAMUSCULAR | Status: DC
  Administered 2018-04-09 – 2018-04-10 (×2): 40 mg via INTRAVENOUS
  Filled 2018-04-09 (×2): qty 4

## 2018-04-09 NOTE — Progress Notes (Signed)
3 Days Post-Op Procedure(s) (LRB): CORONARY ARTERY BYPASS GRAFTING (CABG) x 4 WITH ENDOSCOPIC HARVESTING OF RIGHT SAPHENOUS VEIN (N/A) MAZE (N/A) TRANSESOPHAGEAL ECHOCARDIOGRAM (TEE) (N/A) CLIPPING OF ATRIAL APPENDAGE USING ATRICURE ATRICLIP ACHV40 (Left) Subjective: Feels ok but did not sleep much. Says he did not get his Klonopin last night and can't sleep without it.  Breathing is ok  Objective: Vital signs in last 24 hours: Temp:  [97.7 F (36.5 C)-99.5 F (37.5 C)] 97.9 F (36.6 C) (07/04 0810) Cardiac Rhythm: Atrial fibrillation (07/04 0730) Resp:  [15-32] 18 (07/04 0900) BP: (98-143)/(58-96) 131/75 (07/04 0900) SpO2:  [80 %-100 %] 100 % (07/04 0900) Arterial Line BP: (98-126)/(44-56) 125/51 (07/03 1800) Weight:  [105.1 kg (231 lb 11.3 oz)] 105.1 kg (231 lb 11.3 oz) (07/04 0500)  Hemodynamic parameters for last 24 hours:    Intake/Output from previous day: 07/03 0701 - 07/04 0700 In: 2896.5 [P.O.:1444; I.V.:1098.4; IV Piggyback:354.2] Out: 2825 [Urine:2825] Intake/Output this shift: Total I/O In: 466.6 [P.O.:360; I.V.:106.6] Out: -   General appearance: alert and cooperative Neurologic: intact Heart: irregularly irregular rhythm Lungs: rales bilaterally Extremities: edema moderate Wound: incisions ok  Lab Results: Recent Labs    04/08/18 1608 04/09/18 0500  WBC 15.5* 18.1*  HGB 10.1* 10.6*  HCT 30.8* 31.8*  PLT 117* 138*   BMET:  Recent Labs    04/08/18 0324 04/08/18 1604 04/08/18 1608 04/09/18 0500  NA 136 138  --  137  K 3.9 3.6  --  4.1  CL 105 99  --  101  CO2 24  --   --  28  GLUCOSE 124* 205*  --  156*  BUN 17 14  --  14  CREATININE 1.11 0.90 1.14 0.97  CALCIUM 8.2*  --   --  8.7*    PT/INR:  Recent Labs    04/06/18 1435  LABPROT 16.8*  INR 1.37   ABG    Component Value Date/Time   PHART 7.414 04/08/2018 0450   HCO3 25.3 04/08/2018 0450   TCO2 21 (L) 04/08/2018 1604   ACIDBASEDEF 2.0 04/08/2018 0336   O2SAT 77.4 04/09/2018  0455   CBG (last 3)  Recent Labs    04/08/18 1935 04/09/18 0647 04/09/18 0818  GLUCAP 188* 142* 138*   CLINICAL DATA:  Follow-up pulmonary edema  EXAM: PORTABLE CHEST 1 VIEW  COMPARISON:  04/08/2018  FINDINGS: Cardiac shadow is stable. Postsurgical changes are again noted. Right jugular sheath is again seen. Diffuse bilateral opacities are noted consistent with pulmonary edema. There has been some slight improvement from the prior exam particularly on the right. No new focal abnormality is noted.  IMPRESSION: Slight improvement in pulmonary edema particularly on the right.   Electronically Signed   By: Alcide CleverMark  Lukens M.D.   On: 04/09/2018 07:45   Assessment/Plan: S/P Procedure(s) (LRB): CORONARY ARTERY BYPASS GRAFTING (CABG) x 4 WITH ENDOSCOPIC HARVESTING OF RIGHT SAPHENOUS VEIN (N/A) MAZE (N/A) TRANSESOPHAGEAL ECHOCARDIOGRAM (TEE) (N/A) CLIPPING OF ATRIAL APPENDAGE USING ATRICURE ATRICLIP ACHV40 (Left)  POD 3 Hemodynamically stable with Co-ox 77.4 on milrinone 0.25 and dop 2.5. Will continue these today.  Atrial fibrillation with rate low 100's on digoxin and amio. Continue these. Plan to resume Eliquis at discharge.  Volume excess and pulmonary edema: positive 800 cc yesterday and wt up a pound so will increase lasix to 40 bid and add a dose of metolazone. CXR slightly improved today. Wt is still 11 lbs over preop and he is edematous.  DM: glucose under adequate control.  Continue IS and ambulation.  LOS: 3 days    Alleen Borne 04/09/2018

## 2018-04-09 NOTE — Progress Notes (Signed)
Patient ID: Jeremy King, male   DOB: May 11, 1951, 67 y.o.   MRN: [id number] [facility] Evening Rounds:  Hemodynamically stable  Excellent diuresis today with lasix 40 and 5 mg metolazone. -2800 cc so far. Replacing K+ BMET    Component Value Date/Time   NA 136 04/09/2018 1648   NA 143 03/09/2018 1558   K 4.0 04/09/2018 1648   CL 94 (L) 04/09/2018 1648   CO2 28 04/09/2018 0500   GLUCOSE 163 (H) 04/09/2018 1648   BUN 18 04/09/2018 1648   BUN 17 03/09/2018 1558   CREATININE 1.00 04/09/2018 1648   CREATININE 1.06 01/12/2015 1358   CALCIUM 8.7 (L) 04/09/2018 0500   GFRNONAA >60 04/09/2018 0500   GFRAA >60 04/09/2018 0500   Ambulated well and feels better  sats 95% on 5L.

## 2018-04-10 ENCOUNTER — Inpatient Hospital Stay (HOSPITAL_COMMUNITY): Payer: Medicare Other

## 2018-04-10 ENCOUNTER — Inpatient Hospital Stay: Payer: Self-pay

## 2018-04-10 LAB — BASIC METABOLIC PANEL
Anion gap: 10 (ref 5–15)
BUN: 17 mg/dL (ref 8–23)
CO2: 32 mmol/L (ref 22–32)
Calcium: 9 mg/dL (ref 8.9–10.3)
Chloride: 94 mmol/L — ABNORMAL LOW (ref 98–111)
Creatinine, Ser: 1.11 mg/dL (ref 0.61–1.24)
GFR calc Af Amer: >60 mL/min (ref >60)
GFR calc non Af Amer: >60 mL/min (ref >60)
Glucose, Bld: 131 mg/dL — ABNORMAL HIGH (ref 70–99)
Potassium: 3.9 mmol/L (ref 3.5–5.1)
Sodium: 136 mmol/L (ref 135–145)

## 2018-04-10 LAB — GLUCOSE, CAPILLARY
Glucose-Capillary: 114 mg/dL — ABNORMAL HIGH (ref 70–99)
Glucose-Capillary: 114 mg/dL — ABNORMAL HIGH (ref 70–99)
Glucose-Capillary: 122 mg/dL — ABNORMAL HIGH (ref 70–99)
Glucose-Capillary: 113 mg/dL — ABNORMAL HIGH (ref 70–99)
Glucose-Capillary: 118 mg/dL — ABNORMAL HIGH (ref 70–99)

## 2018-04-10 LAB — POCT I-STAT, CHEM 8
BUN: 22 mg/dL (ref 8–23)
Calcium, Ion: 1.18 mmol/L (ref 1.15–1.40)
Chloride: 91 mmol/L — ABNORMAL LOW (ref 98–111)
Creatinine, Ser: 1.1 mg/dL (ref 0.61–1.24)
Glucose, Bld: 127 mg/dL — ABNORMAL HIGH (ref 70–99)
HCT: 37 % — ABNORMAL LOW (ref 39.0–52.0)
Hemoglobin: 12.6 g/dL — ABNORMAL LOW (ref 13.0–17.0)
Potassium: 3.7 mmol/L (ref 3.5–5.1)
Sodium: 136 mmol/L (ref 135–145)
TCO2: 32 mmol/L (ref 22–32)

## 2018-04-10 LAB — CBC
HCT: 33.3 % — ABNORMAL LOW (ref 39.0–52.0)
Hemoglobin: 11.2 g/dL — ABNORMAL LOW (ref 13.0–17.0)
MCH: 32.5 pg (ref 26.0–34.0)
MCHC: 33.6 g/dL (ref 30.0–36.0)
MCV: 96.5 fL (ref 78.0–100.0)
Platelets: 198 10*3/uL (ref 150–400)
RBC: 3.45 MIL/uL — ABNORMAL LOW (ref 4.22–5.81)
RDW: 12.9 % (ref 11.5–15.5)
WBC: 18.3 10*3/uL — ABNORMAL HIGH (ref 4.0–10.5)

## 2018-04-10 LAB — COOXEMETRY PANEL
Carboxyhemoglobin: 1.1 % (ref 0.5–1.5)
Methemoglobin: 1.4 % (ref 0.0–1.5)
O2 Saturation: 71.2 %
Total hemoglobin: 11.6 g/dL — ABNORMAL LOW (ref 12.0–16.0)

## 2018-04-10 MED ORDER — FUROSEMIDE 10 MG/ML IJ SOLN: 40.0000 mg | Freq: Every day | INTRAMUSCULAR | Status: DC

## 2018-04-10 MED ORDER — CHLORHEXIDINE GLUCONATE CLOTH 2 % EX PADS
6.0000 | MEDICATED_PAD | Freq: Every day | CUTANEOUS | Status: DC
  Administered 2018-04-10 – 2018-04-15 (×5): 6 via TOPICAL

## 2018-04-10 MED ORDER — METOLAZONE 5 MG PO TABS
5.0000 mg | ORAL_TABLET | Freq: Once | ORAL | Status: AC
Start: 2018-04-10 — End: 2018-04-10
  Administered 2018-04-10: 5 mg via ORAL
  Filled 2018-04-10: qty 1

## 2018-04-10 MED ORDER — SODIUM CHLORIDE 0.9% FLUSH: 10.0000 mL | INTRAVENOUS | Status: DC | PRN

## 2018-04-10 MED ORDER — POTASSIUM CHLORIDE 10 MEQ/50ML IV SOLN
10.0000 meq | INTRAVENOUS | Status: AC
  Administered 2018-04-10 (×2): 10 meq via INTRAVENOUS
  Filled 2018-04-10 (×2): qty 50

## 2018-04-10 MED ORDER — AMIODARONE HCL 200 MG PO TABS
400.0000 mg | ORAL_TABLET | Freq: Two times a day (BID) | ORAL | Status: DC
  Administered 2018-04-10 – 2018-04-15 (×11): 400 mg via ORAL
  Filled 2018-04-10 (×11): qty 2

## 2018-04-10 MED ORDER — POTASSIUM CHLORIDE 10 MEQ/50ML IV SOLN
10.0000 meq | INTRAVENOUS | Status: AC
  Administered 2018-04-10 (×3): 10 meq via INTRAVENOUS
  Filled 2018-04-10 (×3): qty 50

## 2018-04-10 MED ORDER — DIGOXIN 125 MCG PO TABS: 0.1250 mg | ORAL_TABLET | Freq: Every day | ORAL | Status: DC

## 2018-04-10 MED ORDER — DOPAMINE-DEXTROSE 3.2-5 MG/ML-% IV SOLN: 1.0000 ug/kg/min | INTRAVENOUS | Status: AC

## 2018-04-10 MED ORDER — SODIUM CHLORIDE 0.9% FLUSH
10.0000 mL | Freq: Two times a day (BID) | INTRAVENOUS | Status: DC
  Administered 2018-04-11 – 2018-04-15 (×7): 10 mL

## 2018-04-10 NOTE — Progress Notes (Deleted)
Patient sitting up in chair eating lunch.  Denies pain.  NAD noted.  Decreased O2 to 4L/[place] via HFNC.  Sats 96%.  Patient becomes exertional dyspneic, but is able to use pursed lip breathing to increase sats to 96% within 20 seconds.

## 2018-04-10 NOTE — Progress Notes (Signed)
Dr. Donata ClayVan Trigt at bedside to see patient.  New verbal orders received to decrease Dopamine gtt to 311mcg/kg/min and then turn off gtt at 2000 tonight. Apprised that patient was put to bed for PICC line insertion and will ambulate after procedure is complete.  IV team present.  Patient verbalized understanding of the procedure and signed written consent for PICC line placement.  O2 decreased to 2L/[place] for sats 96%on 3L.[name].  NAD noted.  VSS. Will replace potassium of 3.7 per protocol.

## 2018-04-10 NOTE — Progress Notes (Signed)
Patient ID: Jeremy King, male   DOB: 07-25-1951, 67 y.o.   MRN: [id number] [facility] Evening Rounds:  Hemodynamically stable in atrial fib on amio. Dopamine is off. Still on milrinone 0.125. Still diuresing well. Replacing K+ sats 96% on 2L

## 2018-04-10 NOTE — Progress Notes (Signed)
Peripherally Inserted Central Catheter/Midline Placement  The IV Nurse has discussed with the patient and/or persons authorized to consent for the patient, the purpose of this procedure and the potential benefits and risks involved with this procedure.  The benefits include less needle sticks, lab draws from the catheter, and the patient may be discharged home with the catheter. Risks include, but not limited to, infection, bleeding, blood clot (thrombus formation), and puncture of an artery; nerve damage and irregular heartbeat and possibility to perform a PICC exchange if needed/ordered by physician.  Alternatives to this procedure were also discussed.  Bard Power PICC patient education guide, fact sheet on infection prevention and patient information card has been provided to patient /or left at bedside.    PICC/Midline Placement Documentation  PICC Double Lumen 04/10/18 PICC Right Basilic 44 cm 0 cm (Active)  Indication for Insertion or Continuance of Line Vasoactive infusions 04/10/2018  5:56 PM  Exposed Catheter (cm) 0 cm 04/10/2018  5:56 PM  Site Assessment Clean;Dry;Intact 04/10/2018  5:56 PM  Lumen #1 Status Flushed;Saline locked;Blood return noted 04/10/2018  5:56 PM  Lumen #2 Status Saline locked;Flushed;Blood return noted 04/10/2018  5:56 PM  Dressing Type Transparent 04/10/2018  5:56 PM  Dressing Status Clean;Dry;Intact;Antimicrobial disc in place 04/10/2018  5:56 PM  Dressing Change Due 04/17/18 04/10/2018  5:56 PM       Ethelda Chickurrie, Nawaf Strange Robert 04/10/2018, 5:58 PM

## 2018-04-10 NOTE — Progress Notes (Signed)
4 Days Post-Op Procedure(s) (LRB): CORONARY ARTERY BYPASS GRAFTING (CABG) x 4 WITH ENDOSCOPIC HARVESTING OF RIGHT SAPHENOUS VEIN (N/A) MAZE (N/A) TRANSESOPHAGEAL ECHOCARDIOGRAM (TEE) (N/A) CLIPPING OF ATRIAL APPENDAGE USING ATRICURE ATRICLIP ACHV40 (Left) Subjective: Pulmonary status improved with diuresis Walked in hall Weaning milrinone, renal doapmine Resume xarelto for afib at discharge after EPWs out Objective: Vital signs in last 24 hours: Temp:  [97.7 F (36.5 C)-98.6 F (37 C)] 97.7 F (36.5 C) (07/05 0751) Pulse Rate:  [101-128] 114 (07/05 0700) Cardiac Rhythm: Atrial fibrillation (07/05 0740) Resp:  [15-27] 23 (07/05 0700) BP: (94-136)/(65-99) 123/77 (07/05 0700) SpO2:  [91 %-100 %] 97 % (07/05 0751) Weight:  [223 lb 5.2 oz (101.3 kg)] 223 lb 5.2 oz (101.3 kg) (07/05 0600)  Hemodynamic parameters for last 24 hours:  stable BP Intake/Output from previous day: 07/04 0701 - 07/05 0700 In: 2655.2 [P.O.:1200; I.V.:1055.1; IV Piggyback:400.1] Out: 7640 [Urine:7640] Intake/Output this shift: Total I/O In: 742.8 [P.O.:660; I.V.:82.8] Out: 425 [Urine:425]       Exam    General- alert and comfortable    Neck- no JVD, no cervical adenopathy palpable, no carotid bruit   Lungs- clear without rales, wheezes   Cor- regular rate and rhythm, no murmur , gallop   Abdomen- soft, non-tender   Extremities - warm, non-tender, minimal edema   Neuro- oriented, appropriate, no focal weakness   Lab Results: Recent Labs    04/09/18 0500 04/09/18 1648 04/10/18 0331  WBC 18.1*  --  18.3*  HGB 10.6* 11.6* 11.2*  HCT 31.8* 34.0* 33.3*  PLT 138*  --  198   BMET:  Recent Labs    04/09/18 0500 04/09/18 1648 04/10/18 0331  NA 137 136 136  K 4.1 4.0 3.9  CL 101 94* 94*  CO2 28  --  32  GLUCOSE 156* 163* 131*  BUN 14 18 17   CREATININE 0.97 1.00 1.11  CALCIUM 8.7*  --  9.0    PT/INR: No results for input(s): LABPROT, INR in the last 72 hours. ABG    Component Value  Date/Time   PHART 7.414 04/08/2018 0450   HCO3 25.3 04/08/2018 0450   TCO2 28 04/09/2018 1648   ACIDBASEDEF 2.0 04/08/2018 0336   O2SAT 71.2 04/10/2018 0325   CBG (last 3)  Recent Labs    04/09/18 1646 04/09/18 2112 04/10/18 0703  GLUCAP 169* 138* 114*    Assessment/Plan: S/P Procedure(s) (LRB): CORONARY ARTERY BYPASS GRAFTING (CABG) x 4 WITH ENDOSCOPIC HARVESTING OF RIGHT SAPHENOUS VEIN (N/A) MAZE (N/A) TRANSESOPHAGEAL ECHOCARDIOGRAM (TEE) (N/A) CLIPPING OF ATRIAL APPENDAGE USING ATRICURE ATRICLIP ACHV40 (Left) Mobilize Diuresis place PICC and DC cordis  Keep in ICU for pulmonary problems   LOS: 4 days    [name] 04/10/2018

## 2018-04-11 ENCOUNTER — Inpatient Hospital Stay (HOSPITAL_COMMUNITY): Payer: Medicare Other

## 2018-04-11 LAB — BASIC METABOLIC PANEL
Anion gap: 10 (ref 5–15)
BUN: 18 mg/dL (ref 8–23)
CO2: 32 mmol/L (ref 22–32)
Calcium: 9 mg/dL (ref 8.9–10.3)
Chloride: 96 mmol/L — ABNORMAL LOW (ref 98–111)
Creatinine, Ser: 1.16 mg/dL (ref 0.61–1.24)
GFR calc Af Amer: >60 mL/min (ref >60)
GFR calc non Af Amer: >60 mL/min (ref >60)
Glucose, Bld: 97 mg/dL (ref 70–99)
Potassium: 4.2 mmol/L (ref 3.5–5.1)
Sodium: 138 mmol/L (ref 135–145)

## 2018-04-11 LAB — GLUCOSE, CAPILLARY
Glucose-Capillary: 107 mg/dL — ABNORMAL HIGH (ref 70–99)
Glucose-Capillary: 112 mg/dL — ABNORMAL HIGH (ref 70–99)
Glucose-Capillary: 98 mg/dL (ref 70–99)
Glucose-Capillary: 123 mg/dL — ABNORMAL HIGH (ref 70–99)
Glucose-Capillary: 114 mg/dL — ABNORMAL HIGH (ref 70–99)

## 2018-04-11 LAB — CBC
HCT: 36.7 % — ABNORMAL LOW (ref 39.0–52.0)
Hemoglobin: 12.1 g/dL — ABNORMAL LOW (ref 13.0–17.0)
MCH: 32.1 pg (ref 26.0–34.0)
MCHC: 33 g/dL (ref 30.0–36.0)
MCV: 97.3 fL (ref 78.0–100.0)
Platelets: 251 10*3/uL (ref 150–400)
RBC: 3.77 MIL/uL — ABNORMAL LOW (ref 4.22–5.81)
RDW: 13.1 % (ref 11.5–15.5)
WBC: 9.2 10*3/uL (ref 4.0–10.5)

## 2018-04-11 LAB — COOXEMETRY PANEL
CARBOXYHEMOGLOBIN: 1.3 % (ref 0.5–1.5)
Carboxyhemoglobin: 1.7 % — ABNORMAL HIGH (ref 0.5–1.5)
Methemoglobin: 1 % (ref 0.0–1.5)
Methemoglobin: 1.4 % (ref 0.0–1.5)
O2 Saturation: 55.3 %
O2 Saturation: 53.4 %
Total hemoglobin: 12 g/dL (ref 12.0–16.0)
Total hemoglobin: 12.4 g/dL (ref 12.0–16.0)

## 2018-04-11 MED ORDER — DIGOXIN 125 MCG PO TABS
0.1250 mg | ORAL_TABLET | Freq: Every day | ORAL | Status: DC
  Administered 2018-04-12 – 2018-04-15 (×4): 0.125 mg via ORAL
  Filled 2018-04-11 (×4): qty 1

## 2018-04-11 MED ORDER — DIGOXIN 0.25 MG/ML IJ SOLN
0.2500 mg | Freq: Once | INTRAMUSCULAR | Status: AC
  Administered 2018-04-11: 0.25 mg via INTRAVENOUS
  Filled 2018-04-11: qty 2

## 2018-04-11 MED ORDER — AMIODARONE IV BOLUS ONLY 150 MG/100ML
150.0000 mg | Freq: Once | INTRAVENOUS | Status: AC
Start: 2018-04-11 — End: 2018-04-11
  Administered 2018-04-11: 150 mg via INTRAVENOUS
  Filled 2018-04-11: qty 100

## 2018-04-11 MED ORDER — MELATONIN 3 MG PO TABS
3.0000 mg | ORAL_TABLET | Freq: Every day | ORAL | Status: DC
  Administered 2018-04-11 – 2018-04-14 (×4): 3 mg via ORAL
  Filled 2018-04-11 (×4): qty 1

## 2018-04-11 NOTE — Plan of Care (Signed)
  Problem: Nutrition: Goal: Adequate nutrition will be maintained Outcome: Progressing   Problem: Coping: Goal: Level of anxiety will decrease Outcome: Progressing   Problem: Pain Managment: Goal: General experience of comfort will improve Outcome: Progressing   Problem: Activity: Goal: Risk for activity intolerance will decrease Outcome: Progressing   Problem: Cardiac: Goal: Hemodynamic stability will improve Outcome: Progressing   Problem: Respiratory: Goal: Respiratory status will improve Outcome: Progressing   Problem: Skin Integrity: Goal: Risk for impaired skin integrity will decrease Outcome: Progressing   Problem: Urinary Elimination: Goal: Ability to achieve and maintain adequate renal perfusion and functioning will improve Outcome: Progressing

## 2018-04-11 NOTE — Progress Notes (Signed)
Patient ID: Jeremy King, male   DOB: June 27, 1951, 67 y.o.   MRN: [id number] [facility] Evening Rounds  Hemodynamically stable, still in atrial fib with rate 114 now. Rate has been in the 90's a good part of the day. Milrinone stopped this am with negligible change in Co-ox from 55.3 to 53.4. Will check in am. Ambulated but gets short of breath. sats 97% on 2L Good urine output

## 2018-04-11 NOTE — Progress Notes (Signed)
5 Days Post-Op Procedure(s) (LRB): CORONARY ARTERY BYPASS GRAFTING (CABG) x 4 WITH ENDOSCOPIC HARVESTING OF RIGHT SAPHENOUS VEIN (N/A) MAZE (N/A) TRANSESOPHAGEAL ECHOCARDIOGRAM (TEE) (N/A) CLIPPING OF ATRIAL APPENDAGE USING ATRICURE ATRICLIP ACHV40 (Left) Subjective: Feels ok but did not sleep much. Wants to take Melatonin which he was on at home and works well for him. Breathing is good.   Objective: Vital signs in last 24 hours: Temp:  [97.4 F (36.3 C)-98.5 F (36.9 C)] 97.4 F (36.3 C) (07/06 0700) Pulse Rate:  [25-145] 115 (07/06 0800) Cardiac Rhythm: Atrial fibrillation (07/06 0800) Resp:  [17-31] 19 (07/06 0800) BP: (88-134)/(53-84) 104/82 (07/06 0800) SpO2:  [83 %-99 %] 97 % (07/06 0804) Weight:  [98.2 kg (216 lb 6.4 oz)] 98.2 kg (216 lb 6.4 oz) (07/06 0500)  Hemodynamic parameters for last 24 hours:    Intake/Output from previous day: 07/05 0701 - 07/06 0700 In: 2507.9 [P.O.:1680; I.V.:425.6; IV Piggyback:402.3] Out: 4960 [Urine:4960] Intake/Output this shift: Total I/O In: 22.9 [I.V.:22.9] Out: 400 [Urine:400]  General appearance: alert and cooperative Neurologic: intact Heart: irregularly irregular rhythm Lungs: clear to auscultation bilaterally Extremities: extremities normal, atraumatic, no cyanosis or edema Wound: incisions ok  Lab Results: Recent Labs    04/10/18 0331 04/10/18 1706 04/11/18 0400  WBC 18.3*  --  9.2  HGB 11.2* 12.6* 12.1*  HCT 33.3* 37.0* 36.7*  PLT 198  --  251   BMET:  Recent Labs    04/10/18 0331 04/10/18 1706 04/11/18 0400  NA 136 136 138  K 3.9 3.7 4.2  CL 94* 91* 96*  CO2 32  --  32  GLUCOSE 131* 127* 97  BUN 17 22 18   CREATININE 1.11 1.10 1.16  CALCIUM 9.0  --  9.0    PT/INR: No results for input(s): LABPROT, INR in the last 72 hours. ABG    Component Value Date/Time   PHART 7.414 04/08/2018 0450   HCO3 25.3 04/08/2018 0450   TCO2 32 04/10/2018 1706   ACIDBASEDEF 2.0 04/08/2018 0336   O2SAT 55.3  04/11/2018 0438   CBG (last 3)  Recent Labs    04/10/18 2155 04/11/18 0646 04/11/18 0744  GLUCAP 118* 107* 112*   CXR: looks good. Minimal atelectasis. No effusion.  Assessment/Plan: S/P Procedure(s) (LRB): CORONARY ARTERY BYPASS GRAFTING (CABG) x 4 WITH ENDOSCOPIC HARVESTING OF RIGHT SAPHENOUS VEIN (N/A) MAZE (N/A) TRANSESOPHAGEAL ECHOCARDIOGRAM (TEE) (N/A) CLIPPING OF ATRIAL APPENDAGE USING ATRICURE ATRICLIP ACHV40 (Left)  POD 5  Hemodynamically stable but BP is low normal 95/59. Has remained in atrial fib/flutter but rate up to 130's this am. Will give a bolus of amio and an IV dose of digoxin 0.25 this am. BP will not allow beta blocker. Co-ox is 55 this am if accurate but was 70's yesterday. He looks good so will stop milrinone and check Co-ox later today.  He was on Eliquis preop and plan to start at discharge.   Volume excess resolved with large diuresis over the past few days. Wt down 4 lbs below preop. He has no edema and developing contraction alkalosis so will stop diuretic for now.  Continue IS, ambulation.  Glucose has been normal and preop Hgb A1c was 5.3 on no meds so will stop CBG's.   LOS: 5 days    Jeremy King 04/11/2018

## 2018-04-12 LAB — BASIC METABOLIC PANEL
Anion gap: 7 (ref 5–15)
BUN: 22 mg/dL (ref 8–23)
CO2: 31 mmol/L (ref 22–32)
Calcium: 8.6 mg/dL — ABNORMAL LOW (ref 8.9–10.3)
Chloride: 98 mmol/L (ref 98–111)
Creatinine, Ser: 1.13 mg/dL (ref 0.61–1.24)
GFR calc Af Amer: >60 mL/min (ref >60)
GFR calc non Af Amer: >60 mL/min (ref >60)
Glucose, Bld: 105 mg/dL — ABNORMAL HIGH (ref 70–99)
Potassium: 4 mmol/L (ref 3.5–5.1)
Sodium: 136 mmol/L (ref 135–145)

## 2018-04-12 LAB — GLUCOSE, CAPILLARY
Glucose-Capillary: 127 mg/dL — ABNORMAL HIGH (ref 70–99)
Glucose-Capillary: 138 mg/dL — ABNORMAL HIGH (ref 70–99)
Glucose-Capillary: 135 mg/dL — ABNORMAL HIGH (ref 70–99)

## 2018-04-12 LAB — DIGOXIN LEVEL: DIGOXIN LVL: 0.7 ng/mL — AB (ref 0.8–2.0)

## 2018-04-12 LAB — CBC
HCT: 36.2 % — ABNORMAL LOW (ref 39.0–52.0)
Hemoglobin: 11.8 g/dL — ABNORMAL LOW (ref 13.0–17.0)
MCH: 31.9 pg (ref 26.0–34.0)
MCHC: 32.6 g/dL (ref 30.0–36.0)
MCV: 97.8 fL (ref 78.0–100.0)
Platelets: 269 10*3/uL (ref 150–400)
RBC: 3.7 MIL/uL — ABNORMAL LOW (ref 4.22–5.81)
RDW: 13 % (ref 11.5–15.5)
WBC: 11.4 10*3/uL — ABNORMAL HIGH (ref 4.0–10.5)

## 2018-04-12 LAB — COOXEMETRY PANEL
Carboxyhemoglobin: 1.2 % (ref 0.5–1.5)
METHEMOGLOBIN: 1.4 % (ref 0.0–1.5)
O2 Saturation: 52.4 %
TOTAL HEMOGLOBIN: 13.3 g/dL (ref 12.0–16.0)

## 2018-04-12 MED ORDER — DIGOXIN 0.25 MG/ML IJ SOLN
0.2500 mg | Freq: Once | INTRAMUSCULAR | Status: AC
  Administered 2018-04-12: 0.25 mg via INTRAVENOUS
  Filled 2018-04-12: qty 2

## 2018-04-12 NOTE — Progress Notes (Signed)
Patient ID: Jeremy MoraleKimon R Bulger, male   DOB: November 12, 1950, 67 y.o.   MRN: [id number] T[facility] Evening Rounds:  Hemodynamically stable  Remains in atrial fib with rate 80-90's at rest, 110 with ambulation  Took 3 walks today and says he feels stronger.  2 BM's today.

## 2018-04-12 NOTE — Progress Notes (Signed)
6 Days Post-Op Procedure(s) (LRB): CORONARY ARTERY BYPASS GRAFTING (CABG) x 4 WITH ENDOSCOPIC HARVESTING OF RIGHT SAPHENOUS VEIN (N/A) MAZE (N/A) TRANSESOPHAGEAL ECHOCARDIOGRAM (TEE) (N/A) CLIPPING OF ATRIAL APPENDAGE USING ATRICURE ATRICLIP ACHV40 (Left) Subjective: Constipated but otherwise feels ok  Objective: Vital signs in last 24 hours: Temp:  [97.4 F (36.3 C)-98 F (36.7 C)] 97.6 F (36.4 C) (07/07 0400) Pulse Rate:  [77-119] 109 (07/07 0831) Cardiac Rhythm: Atrial fibrillation (07/07 0400) Resp:  [17-29] 20 (07/07 0700) BP: (92-170)/(63-140) 104/77 (07/07 0700) SpO2:  [87 %-100 %] 97 % (07/07 0812) Weight:  [95.6 kg (210 lb 12.2 oz)] 95.6 kg (210 lb 12.2 oz) (07/07 0600)  Hemodynamic parameters for last 24 hours:    Intake/Output from previous day: 07/06 0701 - 07/07 0700 In: 650.1 [I.V.:350.1; IV Piggyback:300] Out: 2500 [Urine:2500] Intake/Output this shift: Total I/O In: 720 [P.O.:720] Out: -   General appearance: alert and cooperative Neurologic: intact Heart: irregularly irregular rhythm Lungs: diminished breath sounds bibasilar Abdomen: soft, non-tender; bowel sounds normal; no masses,  no organomegaly Extremities: extremities normal, atraumatic, no cyanosis or edema Wound: incisions ok  Lab Results: Recent Labs    04/11/18 0400 04/12/18 0418  WBC 9.2 11.4*  HGB 12.1* 11.8*  HCT 36.7* 36.2*  PLT 251 269   BMET:  Recent Labs    04/11/18 0400 04/12/18 0418  NA 138 136  K 4.2 4.0  CL 96* 98  CO2 32 31  GLUCOSE 97 105*  BUN 18 22  CREATININE 1.16 1.13  CALCIUM 9.0 8.6*    PT/INR: No results for input(s): LABPROT, INR in the last 72 hours. ABG    Component Value Date/Time   PHART 7.414 04/08/2018 0450   HCO3 25.3 04/08/2018 0450   TCO2 32 04/10/2018 1706   ACIDBASEDEF 2.0 04/08/2018 0336   O2SAT 52.4 04/12/2018 0420   CBG (last 3)  Recent Labs    04/11/18 1557 04/11/18 2109 04/12/18 0701  GLUCAP 123* 114* 127*     Assessment/Plan: S/P Procedure(s) (LRB): CORONARY ARTERY BYPASS GRAFTING (CABG) x 4 WITH ENDOSCOPIC HARVESTING OF RIGHT SAPHENOUS VEIN (N/A) MAZE (N/A) TRANSESOPHAGEAL ECHOCARDIOGRAM (TEE) (N/A) CLIPPING OF ATRIAL APPENDAGE USING ATRICURE ATRICLIP ACHV40 (Left)  POD 6  Hemodynamically stable. Remains in atrial fib with rate 105-120. SBP in the 90's and with significant COPD will avoid beta blockers. Continue amio and digoxin. Dig level was 0.7 this am so will give him an extra dose to bump this up a little to see if rate control will be better. Co-ox is 52% this am which is stable and not much lower than when on milrinone.   Plan to resume Eliquis at discharge.  Continue IS and ambulation  Volume status looks good and weight is 10 lbs below preop.   LOS: 6 days    Alleen BorneBryan K Anita Laguna 04/12/2018

## 2018-04-13 ENCOUNTER — Inpatient Hospital Stay (HOSPITAL_COMMUNITY): Payer: Medicare Other

## 2018-04-13 LAB — BLOOD GAS, ARTERIAL

## 2018-04-13 LAB — BASIC METABOLIC PANEL
ANION GAP: 7 (ref 5–15)
BUN: 21 mg/dL (ref 8–23)
CHLORIDE: 98 mmol/L (ref 98–111)
CO2: 34 mmol/L — ABNORMAL HIGH (ref 22–32)
Calcium: 8.7 mg/dL — ABNORMAL LOW (ref 8.9–10.3)
Creatinine, Ser: 1.13 mg/dL (ref 0.61–1.24)
GFR calc Af Amer: >60 mL/min (ref >60)
GLUCOSE: 107 mg/dL — AB (ref 70–99)
POTASSIUM: 4.1 mmol/L (ref 3.5–5.1)
Sodium: 139 mmol/L (ref 135–145)

## 2018-04-13 LAB — CBC
HCT: 35.6 % — ABNORMAL LOW (ref 39.0–52.0)
HEMOGLOBIN: 11.6 g/dL — AB (ref 13.0–17.0)
MCH: 31.9 pg (ref 26.0–34.0)
MCHC: 32.6 g/dL (ref 30.0–36.0)
MCV: 97.8 fL (ref 78.0–100.0)
PLATELETS: 278 10*3/uL (ref 150–400)
RBC: 3.64 MIL/uL — AB (ref 4.22–5.81)
RDW: 13.1 % (ref 11.5–15.5)
WBC: 12.3 10*3/uL — AB (ref 4.0–10.5)

## 2018-04-13 LAB — COOXEMETRY PANEL
CARBOXYHEMOGLOBIN: 1 % (ref 0.5–1.5)
METHEMOGLOBIN: 1.3 % (ref 0.0–1.5)
O2 Saturation: 40.5 %
TOTAL HEMOGLOBIN: 14.4 g/dL (ref 12.0–16.0)

## 2018-04-13 LAB — GLUCOSE, CAPILLARY
Glucose-Capillary: 100 mg/dL — ABNORMAL HIGH (ref 70–99)
Glucose-Capillary: 113 mg/dL — ABNORMAL HIGH (ref 70–99)
Glucose-Capillary: 113 mg/dL — ABNORMAL HIGH (ref 70–99)
Glucose-Capillary: 118 mg/dL — ABNORMAL HIGH (ref 70–99)

## 2018-04-13 MED ORDER — POTASSIUM CHLORIDE CRYS ER 20 MEQ PO TBCR
20.0000 meq | EXTENDED_RELEASE_TABLET | Freq: Every day | ORAL | Status: DC
  Administered 2018-04-14 – 2018-04-15 (×2): 20 meq via ORAL
  Filled 2018-04-13 (×2): qty 1

## 2018-04-13 MED ORDER — SODIUM CHLORIDE 0.9% FLUSH: 3.0000 mL | INTRAVENOUS | Status: DC | PRN

## 2018-04-13 MED ORDER — SODIUM CHLORIDE 0.9% FLUSH
3.0000 mL | Freq: Two times a day (BID) | INTRAVENOUS | Status: DC
  Administered 2018-04-15: 3 mL via INTRAVENOUS

## 2018-04-13 MED ORDER — ATORVASTATIN CALCIUM 40 MG PO TABS
40.0000 mg | ORAL_TABLET | Freq: Every day | ORAL | Status: DC
  Administered 2018-04-13 – 2018-04-14 (×2): 40 mg via ORAL
  Filled 2018-04-13 (×2): qty 1

## 2018-04-13 MED ORDER — MOVING RIGHT ALONG BOOK
Freq: Once | Status: AC
  Administered 2018-04-13: 09:00:00
  Filled 2018-04-13: qty 1

## 2018-04-13 MED ORDER — SODIUM CHLORIDE 0.9 % IV SOLN: 250.0000 mL | INTRAVENOUS | Status: DC | PRN

## 2018-04-13 MED ORDER — APIXABAN 5 MG PO TABS
5.0000 mg | ORAL_TABLET | Freq: Two times a day (BID) | ORAL | Status: DC
  Administered 2018-04-14 – 2018-04-15 (×3): 5 mg via ORAL
  Filled 2018-04-13 (×3): qty 1

## 2018-04-13 NOTE — Progress Notes (Signed)
04/13/2018 1215 Received to room 4E-02 a transfer from 2H.  Pt is A&O, no C/O pain.  Tele monitor applied and CCMD notified.  CHG bath given.  Oriented to room, call light and bed.  Call bell in reach. Kathryne HitchAllen, Tierre Gerard C

## 2018-04-13 NOTE — Care Management Note (Signed)
Case Management Note Donn PieriniKristi Kaley Jutras RN,BSN Unit [facility]H 1-22 Case Manager  [phone]  Patient Details  Name: Laurey MoraleKimon R Varnell MRN: [id number] Date of Birth: [birth year]  Subjective/Objective:   Pt admitted s/p CABGx4 on 04/06/18                 Action/Plan: PTA pt lived at home, CM to follow for transition of care needs  Expected Discharge Date:                  Expected Discharge Plan:  Home/Self Care  In-House Referral:     Discharge planning Services  CM Consult  Post Acute Care Choice:    Choice offered to:     DME Arranged:    DME Agency:     HH Arranged:    HH Agency:     Status of Service:  In process, will continue to follow  If discussed at Long Length of Stay Meetings, dates discussed:    Discharge Disposition:   Additional Comments:  04/13/18- 1530- Donn PieriniKristi Tyeasha Ebbs RN, CM- spoke with pt at bedside- per pt he has been on Eliquis prior to admission- does not have difficulty with copay cost. Per pt he has 2 neighbor friends that will assist him on discharge. Do not anticipate need for DME- however will follow for transition of care needs.   Darrold SpanWebster, Lovie Agresta Hall, RN 04/13/2018, 4:21 PM

## 2018-04-13 NOTE — Progress Notes (Addendum)
      [address]       [place] [id number]             [phone]      7 Days Post-Op Procedure(s) (LRB): CORONARY ARTERY BYPASS GRAFTING (CABG) x 4 WITH ENDOSCOPIC HARVESTING OF RIGHT SAPHENOUS VEIN (N/A) MAZE (N/A) TRANSESOPHAGEAL ECHOCARDIOGRAM (TEE) (N/A) CLIPPING OF ATRIAL APPENDAGE USING ATRICURE ATRICLIP ACHV40 (Left)   Subjective:  Sitting up in chair, looks good,  without complaints.  Was able to move bowels x 2 yesterday Objective: Vital signs in last 24 hours: Temp:  [97.5 F (36.4 C)-98.5 F (36.9 C)] 97.9 F (36.6 C) (07/08 0800) Pulse Rate:  [33-110] 33 (07/08 0700) Cardiac Rhythm: Atrial fibrillation (07/08 0730) Resp:  [14-24] 24 (07/08 0700) BP: (91-131)/(49-103) 131/103 (07/08 0700) SpO2:  [87 %-100 %] 100 % (07/08 0756) Weight:  [210 lb 1.6 oz (95.3 kg)] 210 lb 1.6 oz (95.3 kg) (07/08 0400)  Intake/Output from previous day: 07/07 0701 - 07/08 0700 In: 1466.9 [P.O.:960; I.V.:182.6; IV Piggyback:324.3] Out: 1000 [Urine:1000]  General appearance: alert, cooperative and no distress Heart: irregularly irregular rhythm Lungs: clear to auscultation bilaterally Abdomen: soft, non-tender; bowel sounds normal; no masses,  no organomegaly Extremities: edema trace Wound: clean and dry  Lab Results: Recent Labs    04/12/18 0418 04/13/18 0544  WBC 11.4* 12.3*  HGB 11.8* 11.6*  HCT 36.2* 35.6*  PLT 269 278   BMET:  Recent Labs    04/12/18 0418 04/13/18 0544  NA 136 139  K 4.0 4.1  CL 98 98  CO2 31 34*  GLUCOSE 105* 107*  BUN 22 21  CREATININE 1.13 1.13  CALCIUM 8.6* 8.7*    PT/INR: No results for input(s): LABPROT, INR in the last 72 hours. ABG    Component Value Date/Time   PHART 7.414 04/08/2018 0450   HCO3 25.3 04/08/2018 0450   TCO2 32 04/10/2018 1706   ACIDBASEDEF 2.0 04/08/2018 0336   O2SAT 40.5 04/13/2018 0500   CBG (last 3)  Recent Labs    04/12/18 1124 04/12/18 1628 04/12/18 2351  GLUCAP 138* 135* 113*      Assessment/Plan: S/P Procedure(s) (LRB): CORONARY ARTERY BYPASS GRAFTING (CABG) x 4 WITH ENDOSCOPIC HARVESTING OF RIGHT SAPHENOUS VEIN (N/A) MAZE (N/A) TRANSESOPHAGEAL ECHOCARDIOGRAM (TEE) (N/A) CLIPPING OF ATRIAL APPENDAGE USING ATRICURE ATRICLIP ACHV40 (Left)  1. CV- A. Fib, rate controlled- on Amiodarone, Digoxin, would benefit from low dose Coreg once BP will tolerate, will need Eliquis at discharge 2. Pulm- no acute issues, wean oxygen as tolerated 3. Renal- creatinine stable, weight is trending down, continue diuretics 4. Dispo- patient stable, will d/c EPW today, will need Eliquis prior to discharge, remains on rate controlled A. Fib on Digoxin, Amiodarone, would benefit from low dose BB once BP allows, possibly ready for transfer to step down   LOS: 7 days    Jeremy King 04/13/2018 Patient examined and agree with above assessment and plan by EB , PA.  Patient's temporary pacing wires will be removed and he will be started on oral Eliquis tomorrow.  Will wean oxygen over the next 48 hours and plan for discharge with home health nursing later this week.  patient examined and medical record reviewed,agree with above note. Jeremy King 04/13/2018

## 2018-04-13 NOTE — Progress Notes (Signed)
1417 Pt snoring in room. Checked with RN. Pt has walked once today and really wanted to rest after transfer. RN stated will walk with pt later. Will continue to follow and not walk at this time. Luetta Nuttingharlene Neri Vieyra RN BSN 04/13/2018 2:18 PM

## 2018-04-13 NOTE — Progress Notes (Signed)
Per insurance check on Eliquis  # 41.  [phone]    ELIQUIS 5 MG BID  COVER- YES  CO-PAY- $ 8.50  TIER- 3 DRUG  PRIOR APPROVAL- NO   PREFERRED PHARMACY : YES  [facility] AND [facility]  90 DAY SUPPLY FOR M/O THRU [facility] [place] $ 8.50

## 2018-04-14 ENCOUNTER — Inpatient Hospital Stay (HOSPITAL_COMMUNITY): Payer: Medicare Other

## 2018-04-14 LAB — CBC
HCT: 34.5 % — ABNORMAL LOW (ref 39.0–52.0)
Hemoglobin: 11.3 g/dL — ABNORMAL LOW (ref 13.0–17.0)
MCH: 32.3 pg (ref 26.0–34.0)
MCHC: 32.8 g/dL (ref 30.0–36.0)
MCV: 98.6 fL (ref 78.0–100.0)
Platelets: 290 10*3/uL (ref 150–400)
RBC: 3.5 MIL/uL — ABNORMAL LOW (ref 4.22–5.81)
RDW: 13.2 % (ref 11.5–15.5)
WBC: 10.5 10*3/uL (ref 4.0–10.5)

## 2018-04-14 LAB — BASIC METABOLIC PANEL
Anion gap: 7 (ref 5–15)
BUN: 20 mg/dL (ref 8–23)
CO2: 35 mmol/L — ABNORMAL HIGH (ref 22–32)
Calcium: 8.7 mg/dL — ABNORMAL LOW (ref 8.9–10.3)
Chloride: 98 mmol/L (ref 98–111)
Creatinine, Ser: 1.15 mg/dL (ref 0.61–1.24)
GFR calc Af Amer: >60 mL/min (ref >60)
GFR calc non Af Amer: >60 mL/min (ref >60)
Glucose, Bld: 97 mg/dL (ref 70–99)
Potassium: 3.6 mmol/L (ref 3.5–5.1)
Sodium: 140 mmol/L (ref 135–145)

## 2018-04-14 LAB — GLUCOSE, CAPILLARY
Glucose-Capillary: 104 mg/dL — ABNORMAL HIGH (ref 70–99)
Glucose-Capillary: 89 mg/dL (ref 70–99)
Glucose-Capillary: 124 mg/dL — ABNORMAL HIGH (ref 70–99)

## 2018-04-14 MED ORDER — ASPIRIN EC 81 MG PO TBEC
81.0000 mg | DELAYED_RELEASE_TABLET | Freq: Every day | ORAL | Status: DC
  Administered 2018-04-14 – 2018-04-15 (×2): 81 mg via ORAL
  Filled 2018-04-14 (×2): qty 1

## 2018-04-14 NOTE — Progress Notes (Signed)
Ambulated with RN around 500 ft using front wheel walker,O2 at 2LPM per [place] tolerated well,Osaturation post ambulation is 95%.

## 2018-04-14 NOTE — Progress Notes (Signed)
CARDIAC REHAB PHASE I   PRE:  Rate/Rhythm: 74 SR  BP:  Sitting: 101/62      SaO2: 91 RA  MODE:  Ambulation: 470 ft 91 RA  POST:  Rate/Rhythm: 93 SR  BP:  Sitting: 100/78    SaO2: 90 RA   Pt ambulated 4770ft in hallway on RA. Sats maintained 90%. Pt did have some loss of balance upon rising, helped to sit on bed by cardiac rehab nurse. No c/o dizziness or lightheadedness. Tried to encourage pt to sit in recliner, pt declining at this time. Pt demonstrated 2000 on IS, encouraged to continue use. Will continue to follow.  [phone] Jeremy Boweneresa  Tanis Burnley, RN BSN 04/14/2018 10:41 AM

## 2018-04-14 NOTE — Care Management Important Message (Signed)
Important Message  Patient Details  Name: Jeremy King MRN: [id number] Date of Birth: [birth year]   Medicare Important Message Given:  Yes    Christphor Groft P Makar Slatter 04/14/2018, 2:14 PM

## 2018-04-14 NOTE — Discharge Instructions (Signed)
Discharge Instructions:  1. You may shower, please wash incisions daily with soap and water and keep dry.  If you wish to cover wounds with dressing you may do so but please keep clean and change daily.  No tub baths or swimming until incisions have completely healed.  If your incisions become red or develop any drainage please call our office at [phone]  2. No Driving until cleared by Dr. Zenaida NieceVan Trigt's  office and you are no longer using narcotic pain medications  3. Monitor your weight daily.. Please use the same scale and weigh at same time... If you gain 5-10 lbs in 48 hours with associated lower extremity swelling, please contact our office at [phone]  4. Fever of 101.5 for at least 24 hours with no source, please contact our office at [phone]  5. Activity- up as tolerated, please walk at least 3 times per day.  Avoid strenuous activity, no lifting, pushing, or pulling with your arms over 8-10 lbs for a minimum of 6 weeks  6. If any questions or concerns arise, please do not hesitate to contact our office at [phone]   Information on my medicine - ELIQUIS (apixaban)  This medication education was reviewed with me or my healthcare representative as part of my discharge preparation.   Why was Eliquis prescribed for you? Eliquis was prescribed for you to reduce the risk of forming blood clots that can cause a stroke if you have a medical condition called atrial fibrillation (a type of irregular heartbeat)   What do You need to know about Eliquis ? Take your Eliquis TWICE DAILY - one tablet in the morning and one tablet in the evening with or without food.  It would be best to take the doses about the same time each day.  If you have difficulty swallowing the tablet whole please discuss with your pharmacist how to take the medication safely.  Take Eliquis exactly as prescribed by your doctor and DO NOT stop taking Eliquis without talking to the doctor who prescribed  the medication.  Stopping may increase your risk of developing a new clot or stroke.  Refill your prescription before you run out.  After discharge, you should have regular check-up appointments with your healthcare provider that is prescribing your Eliquis.  In the future your dose may need to be changed if your kidney function or weight changes by a significant amount or as you get older.  What do you do if you miss a dose? If you miss a dose, take it as soon as you remember on the same day and resume taking twice daily.  Do not take more than one dose of ELIQUIS at the same time.  Important Safety Information A possible side effect of Eliquis is bleeding. You should call your healthcare provider right away if you experience any of the following: ? Bleeding from an injury or your nose that does not stop. ? Unusual colored urine (red or dark brown) or unusual colored stools (red or black). ? Unusual bruising for unknown reasons. ? A serious fall or if you hit your head (even if there is no bleeding).  Some medicines may interact with Eliquis and might increase your risk of bleeding or clotting while on Eliquis. To help avoid this, consult your healthcare provider or pharmacist prior to using any new prescription or non-prescription medications, including herbals, vitamins, non-steroidal anti-inflammatory drugs (NSAIDs) and supplements.  This website has more information on Eliquis (apixaban): [email or web address].

## 2018-04-14 NOTE — Progress Notes (Addendum)
      [address]       [place] [id number]             [phone]      8 Days Post-Op Procedure(s) (LRB): CORONARY ARTERY BYPASS GRAFTING (CABG) x 4 WITH ENDOSCOPIC HARVESTING OF RIGHT SAPHENOUS VEIN (N/A) MAZE (N/A) TRANSESOPHAGEAL ECHOCARDIOGRAM (TEE) (N/A) CLIPPING OF ATRIAL APPENDAGE USING ATRICURE ATRICLIP ACHV40 (Left)   Subjective:  No new complaints.  Continues to feel well.  + ambulation  + BM  Objective: Vital signs in last 24 hours: Temp:  [97.9 F (36.6 C)-98.4 F (36.9 C)] 98.3 F (36.8 C) (07/09 0521) Pulse Rate:  [75-93] 81 (07/09 0018) Cardiac Rhythm: Atrial fibrillation (07/09 0521) Resp:  [15-24] 16 (07/09 0018) BP: (90-123)/(64-77) 101/69 (07/09 0018) SpO2:  [86 %-100 %] 95 % (07/09 0018) Weight:  [209 lb 3.5 oz (94.9 kg)] 209 lb 3.5 oz (94.9 kg) (07/09 0523)  Intake/Output from previous day: 07/08 0701 - 07/09 0700 In: 1161.4 [P.O.:1080; I.V.:5.9; IV Piggyback:75.6] Out: 1100 [Urine:1100]  General appearance: alert, cooperative and no distress Heart: irregularly irregular rhythm Lungs: clear to auscultation bilaterally Abdomen: soft, non-tender; bowel sounds normal; no masses,  no organomegaly Extremities: edema trace Wound: clean and dry  Lab Results: Recent Labs    04/13/18 0544 04/14/18 0500  WBC 12.3* 10.5  HGB 11.6* 11.3*  HCT 35.6* 34.5*  PLT 278 290   BMET:  Recent Labs    04/13/18 0544 04/14/18 0500  NA 139 140  K 4.1 3.6  CL 98 98  CO2 34* 35*  GLUCOSE 107* 97  BUN 21 20  CREATININE 1.13 1.15  CALCIUM 8.7* 8.7*    PT/INR: No results for input(s): LABPROT, INR in the last 72 hours. ABG    Component Value Date/Time   PHART 7.414 04/08/2018 0450   HCO3 25.3 04/08/2018 0450   TCO2 32 04/10/2018 1706   ACIDBASEDEF 2.0 04/08/2018 0336   O2SAT 40.5 04/13/2018 0500   CBG (last 3)  Recent Labs    04/13/18 1644 04/13/18 2209 04/14/18 0610  GLUCAP 118* 113* 104*    Assessment/Plan: S/P  Procedure(s) (LRB): CORONARY ARTERY BYPASS GRAFTING (CABG) x 4 WITH ENDOSCOPIC HARVESTING OF RIGHT SAPHENOUS VEIN (N/A) MAZE (N/A) TRANSESOPHAGEAL ECHOCARDIOGRAM (TEE) (N/A) CLIPPING OF ATRIAL APPENDAGE USING ATRICURE ATRICLIP ACHV40 (Left)  1. CV- rate controlled A. Fib- on Amiodarone, Digoxin, Eliquis to restart today, no BB with hypotension 2. Pulm- sats are 99 on 2L of oxygen, will wean off oxygen today, CXR with minimal left posterior pleural effusion 3. Renal- creatinine WNL, weight is trending down, edema improved, continue Lasix, potassium 4. CBGs controlled, patient is not a diabetic will d/c SSIP 5. Dispo- patient stable, restart home Eliquis today, wean off oxygen, likely d/c in AM if remains stable   LOS: 8 days    Jeremy King   The patient has resolved post op acute pulmonary edema - acute lung injury with diuresis   agree with above assessment and plan HHN face to face completed CXR looks good patient examined and medical record reviewed,agree with above note. [name] 04/14/2018

## 2018-04-15 MED ORDER — AMIODARONE HCL 400 MG PO TABS: 400.0000 mg | ORAL_TABLET | Freq: Two times a day (BID) | ORAL | 3 refills | Status: DC

## 2018-04-15 MED ORDER — DIGOXIN 125 MCG PO TABS: 0.1250 mg | ORAL_TABLET | Freq: Every day | ORAL | 3 refills | Status: DC

## 2018-04-15 MED ORDER — TRAMADOL HCL 50 MG PO TABS: 50.0000 mg | ORAL_TABLET | Freq: Four times a day (QID) | ORAL | 0 refills | Status: DC | PRN

## 2018-04-15 MED ORDER — ASPIRIN 81 MG PO TBEC: 81.0000 mg | DELAYED_RELEASE_TABLET | Freq: Every day | ORAL | Status: AC

## 2018-04-15 MED ORDER — ATORVASTATIN CALCIUM 40 MG PO TABS: 40.0000 mg | ORAL_TABLET | Freq: Every day | ORAL | 3 refills | Status: DC

## 2018-04-15 NOTE — Progress Notes (Signed)
Discharge order received. Rolling walker in room for discharge. Patient dressing self. Discharge orders to include self care, medications, follow up appointments, and wound care reviewed with patient. Patient stated understanding. Awaiting personal transport home. Telemetry removed. CCMD notified. Patient in stable condition.

## 2018-04-15 NOTE — Progress Notes (Signed)
CARDIAC REHAB PHASE I   Pt in bed for PICC line removal. Pt educated on sternal precautions, in-the-tube sheet given. Pt educated to shower daily and monitor incisions. Encouraged pt to continue IS use. Gave pt heart healthy diet and cardiac surgery booklet. Reviewed restrictions and exercise guidelines. Strongly encouraged pt to quit smoking, tip sheet given, pt declining fake cigarette. Will refer to CRP II [place].   [phone]  Reynold Boweneresa  Giordano Getman, RN BSN 04/15/2018 9:10 AM

## 2018-04-15 NOTE — Progress Notes (Addendum)
      [address]       [place] [id number]             [phone]      9 Days Post-Op Procedure(s) (LRB): CORONARY ARTERY BYPASS GRAFTING (CABG) x 4 WITH ENDOSCOPIC HARVESTING OF RIGHT SAPHENOUS VEIN (N/A) MAZE (N/A) TRANSESOPHAGEAL ECHOCARDIOGRAM (TEE) (N/A) CLIPPING OF ATRIAL APPENDAGE USING ATRICURE ATRICLIP ACHV40 (Left)   Subjective:  No new complaints.  Feels good and is ready to go home.  + ambulation  + BM  Objective: Vital signs in last 24 hours: Temp:  [97.9 F (36.6 C)-98.5 F (36.9 C)] 98.3 F (36.8 C) (07/10 0341) Pulse Rate:  [75-86] 85 (07/10 0341) Cardiac Rhythm: Atrial fibrillation (07/10 0430) Resp:  [12-25] 25 (07/09 1727) BP: (96-108)/(58-69) 108/62 (07/09 2350) SpO2:  [91 %-99 %] 98 % (07/10 0341) Weight:  [209 lb 14.1 oz (95.2 kg)] 209 lb 14.1 oz (95.2 kg) (07/10 0346)  Intake/Output from previous day: 07/09 0701 - 07/10 0700 In: 840 [P.O.:840] Out: 2101 [Urine:2100; Stool:1]  General appearance: alert, cooperative and no distress Heart: regular rate and rhythm Lungs: clear to auscultation bilaterally Abdomen: soft, non-tender; bowel sounds normal; no masses,  no organomegaly Extremities: edema trace Wound: clean and dry  Lab Results: Recent Labs    04/13/18 0544 04/14/18 0500  WBC 12.3* 10.5  HGB 11.6* 11.3*  HCT 35.6* 34.5*  PLT 278 290   BMET:  Recent Labs    04/13/18 0544 04/14/18 0500  NA 139 140  K 4.1 3.6  CL 98 98  CO2 34* 35*  GLUCOSE 107* 97  BUN 21 20  CREATININE 1.13 1.15  CALCIUM 8.7* 8.7*    PT/INR: No results for input(s): LABPROT, INR in the last 72 hours. ABG    Component Value Date/Time   PHART 7.414 04/08/2018 0450   HCO3 25.3 04/08/2018 0450   TCO2 32 04/10/2018 1706   ACIDBASEDEF 2.0 04/08/2018 0336   O2SAT 40.5 04/13/2018 0500   CBG (last 3)  Recent Labs    04/14/18 0610 04/14/18 1054 04/14/18 1630  GLUCAP 104* 89 124*    Assessment/Plan: S/P Procedure(s)  (LRB): CORONARY ARTERY BYPASS GRAFTING (CABG) x 4 WITH ENDOSCOPIC HARVESTING OF RIGHT SAPHENOUS VEIN (N/A) MAZE (N/A) TRANSESOPHAGEAL ECHOCARDIOGRAM (TEE) (N/A) CLIPPING OF ATRIAL APPENDAGE USING ATRICURE ATRICLIP ACHV40 (Left)  1. CV- NSR this morning- on Amiodarone, Digoxin, Eliquis to start today 2. Pulm- no acute issues, off oxygen, continue IS at discharge 3. Renal- creatinine has been WNL, weight is trending down will continue lasix for 7 days 4. Dispo- patient stable, will d/c PICC line, d/c home today   LOS: 9 days    Erin Barrett 04/15/2018 patient examined and medical record reviewed,agree with above note. [name] 04/15/2018

## 2018-04-15 NOTE — Care Management Note (Signed)
Case Management Note Donn PieriniKristi Lovella Hardie RN,BSN Unit [facility]2H 1-22 Case Manager  [phone]  Patient Details  Name: Jeremy MoraleKimon R King MRN: [id number] Date of Birth: [birth year]  Subjective/Objective:   Pt admitted s/p CABGx4 on 04/06/18                 Action/Plan: PTA pt lived at home, CM to follow for transition of care needs  Expected Discharge Date:  04/15/18               Expected Discharge Plan:  Home w Home Health Services  In-House Referral:  NA  Discharge planning Services  CM Consult  Post Acute Care Choice:  Durable Medical Equipment, Home Health Choice offered to:  Patient  DME Arranged:  Walker rolling DME Agency:  [facility]  HH Arranged:  [facility]  [facility]  Status of Service:  Completed, signed off  If discussed at Long Length of Stay Meetings, dates discussed:    Discharge Disposition: home/home health   Additional Comments:  04/15/18- 1200- Donn PieriniKristi Neeraj Housand RN, CM- pt for transition home today, has friend that will come provide transportation home. [facility]- [facility]- RW has been delivered to room for discharge- spoke with pt at bedside [facility]- per pt he [facility]- referral called to Lupita [facility]Donna [facility]- referral accepted.   04/13/18- 1530- Donn PieriniKristi Lowen Barringer RN, CM- spoke with pt at bedside- per pt he has been on Eliquis prior to admission- does not have difficulty with copay cost. Per pt he has 2 neighbor friends that will assist him on discharge. Do not anticipate need for DME- however will follow for transition of care needs.   Darrold SpanWebster, Leelynn Whetsel Hall, RN 04/15/2018, 11:59 AM

## 2018-04-15 NOTE — Progress Notes (Signed)
Chest tube sutures (2) removed with no drainage noted. Patient tolerated well. 3 Steri-stirps placed to suture removal site. Patient education provided.

## 2018-04-16 ENCOUNTER — Telehealth: Payer: Self-pay | Admitting: *Deleted

## 2018-04-16 ENCOUNTER — Telehealth (HOSPITAL_COMMUNITY): Payer: Self-pay

## 2018-04-16 NOTE — Telephone Encounter (Signed)
Patients insurance is active and benefits verified through [facility] - $20.00 co-pay, no deductible, out of pocket amount of $6,700/$1,312.60 has been met, no co-insurance, and no pre-authorization is required. Passport/reference [phone]  Will contact patient to see if he is interested in the Cardiac Rehab Program. If interested, patient will need to complete follow up appt. Once completed, patient will be contacted for scheduling upon review by the RN Navigator.

## 2018-04-16 NOTE — Telephone Encounter (Signed)
Pt was on TCM list admitted 04/06/18 for coronary artery disease involving native coronary artery of native heart without angina pectoris. Pt underwent CABG x 4 utilizing LIMA to LAD, SVG to PDA, SVG to Ramus Intermediate, SVG to Diagonal.  He underwent MAZE procedure consisting of right and left pulmonary vein isolation with radiofrequency ablation and clipping of the LA Appendage.  Finally he underwent endoscopic harvest of greater saphenous vein from his right leg.  He tolerated the procedures without difficulty. Pt was D/C 04/15/18, and will f/u w/cardiology Theodore Demarkhonda Barrett on 05/04/18. Does not need to make TCM appt w/PCP.[name].Raechel Chute/lmb

## 2018-04-17 ENCOUNTER — Telehealth (HOSPITAL_COMMUNITY): Payer: Self-pay

## 2018-04-17 NOTE — Telephone Encounter (Signed)
Called patient to see if he is interested in the Cardiac Rehab program. Patient stated he is interested. Explained scheduling process and went over insurance, patient verbalized understanding. Will contact patient for scheduling once follow up appt has been completed upon review by the RN Navigator.

## 2018-04-23 DIAGNOSIS — I2510 Atherosclerotic heart disease of native coronary artery without angina pectoris: Secondary | ICD-10-CM | POA: Diagnosis not present

## 2018-04-23 DIAGNOSIS — I255 Ischemic cardiomyopathy: Secondary | ICD-10-CM | POA: Diagnosis not present

## 2018-04-23 DIAGNOSIS — I482 Chronic atrial fibrillation: Secondary | ICD-10-CM | POA: Diagnosis not present

## 2018-04-23 DIAGNOSIS — E785 Hyperlipidemia, unspecified: Secondary | ICD-10-CM | POA: Diagnosis not present

## 2018-04-23 DIAGNOSIS — I110 Hypertensive heart disease with heart failure: Secondary | ICD-10-CM | POA: Diagnosis not present

## 2018-04-23 DIAGNOSIS — Z48812 Encounter for surgical aftercare following surgery on the circulatory system: Secondary | ICD-10-CM | POA: Diagnosis not present

## 2018-04-23 DIAGNOSIS — I5022 Chronic systolic (congestive) heart failure: Secondary | ICD-10-CM | POA: Diagnosis not present

## 2018-04-23 DIAGNOSIS — Z7901 Long term (current) use of anticoagulants: Secondary | ICD-10-CM | POA: Diagnosis not present

## 2018-04-23 DIAGNOSIS — Z951 Presence of aortocoronary bypass graft: Secondary | ICD-10-CM | POA: Diagnosis not present

## 2018-04-23 DIAGNOSIS — J449 Chronic obstructive pulmonary disease, unspecified: Secondary | ICD-10-CM | POA: Diagnosis not present

## 2018-04-23 DIAGNOSIS — M129 Arthropathy, unspecified: Secondary | ICD-10-CM | POA: Diagnosis not present

## 2018-04-29 DIAGNOSIS — M129 Arthropathy, unspecified: Secondary | ICD-10-CM | POA: Diagnosis not present

## 2018-04-29 DIAGNOSIS — Z951 Presence of aortocoronary bypass graft: Secondary | ICD-10-CM | POA: Diagnosis not present

## 2018-04-29 DIAGNOSIS — I5022 Chronic systolic (congestive) heart failure: Secondary | ICD-10-CM | POA: Diagnosis not present

## 2018-04-29 DIAGNOSIS — J449 Chronic obstructive pulmonary disease, unspecified: Secondary | ICD-10-CM | POA: Diagnosis not present

## 2018-04-29 DIAGNOSIS — I482 Chronic atrial fibrillation: Secondary | ICD-10-CM | POA: Diagnosis not present

## 2018-04-29 DIAGNOSIS — Z7901 Long term (current) use of anticoagulants: Secondary | ICD-10-CM | POA: Diagnosis not present

## 2018-04-29 DIAGNOSIS — Z48812 Encounter for surgical aftercare following surgery on the circulatory system: Secondary | ICD-10-CM | POA: Diagnosis not present

## 2018-04-29 DIAGNOSIS — I2510 Atherosclerotic heart disease of native coronary artery without angina pectoris: Secondary | ICD-10-CM | POA: Diagnosis not present

## 2018-04-29 DIAGNOSIS — I255 Ischemic cardiomyopathy: Secondary | ICD-10-CM | POA: Diagnosis not present

## 2018-04-29 DIAGNOSIS — E785 Hyperlipidemia, unspecified: Secondary | ICD-10-CM | POA: Diagnosis not present

## 2018-04-29 DIAGNOSIS — I110 Hypertensive heart disease with heart failure: Secondary | ICD-10-CM | POA: Diagnosis not present

## 2018-05-04 ENCOUNTER — Ambulatory Visit: Payer: Medicare Other | Admitting: Physician Assistant

## 2018-05-07 DIAGNOSIS — I110 Hypertensive heart disease with heart failure: Secondary | ICD-10-CM | POA: Diagnosis not present

## 2018-05-07 DIAGNOSIS — Z951 Presence of aortocoronary bypass graft: Secondary | ICD-10-CM | POA: Diagnosis not present

## 2018-05-07 DIAGNOSIS — I5022 Chronic systolic (congestive) heart failure: Secondary | ICD-10-CM | POA: Diagnosis not present

## 2018-05-07 DIAGNOSIS — I255 Ischemic cardiomyopathy: Secondary | ICD-10-CM | POA: Diagnosis not present

## 2018-05-07 DIAGNOSIS — E785 Hyperlipidemia, unspecified: Secondary | ICD-10-CM | POA: Diagnosis not present

## 2018-05-07 DIAGNOSIS — M129 Arthropathy, unspecified: Secondary | ICD-10-CM | POA: Diagnosis not present

## 2018-05-07 DIAGNOSIS — Z7901 Long term (current) use of anticoagulants: Secondary | ICD-10-CM | POA: Diagnosis not present

## 2018-05-07 DIAGNOSIS — I482 Chronic atrial fibrillation: Secondary | ICD-10-CM | POA: Diagnosis not present

## 2018-05-07 DIAGNOSIS — J449 Chronic obstructive pulmonary disease, unspecified: Secondary | ICD-10-CM | POA: Diagnosis not present

## 2018-05-07 DIAGNOSIS — Z48812 Encounter for surgical aftercare following surgery on the circulatory system: Secondary | ICD-10-CM | POA: Diagnosis not present

## 2018-05-07 DIAGNOSIS — I2510 Atherosclerotic heart disease of native coronary artery without angina pectoris: Secondary | ICD-10-CM | POA: Diagnosis not present

## 2018-05-12 ENCOUNTER — Other Ambulatory Visit: Payer: Self-pay | Admitting: Cardiothoracic Surgery

## 2018-05-12 DIAGNOSIS — Z951 Presence of aortocoronary bypass graft: Secondary | ICD-10-CM

## 2018-05-13 ENCOUNTER — Ambulatory Visit
Admission: RE | Admit: 2018-05-13 | Discharge: 2018-05-13 | Disposition: A | Discharge status: Home / Self Care | Payer: Medicare Other | Source: Ambulatory Visit | Attending: Cardiothoracic Surgery | Admitting: Cardiothoracic Surgery

## 2018-05-13 ENCOUNTER — Other Ambulatory Visit: Payer: Self-pay

## 2018-05-13 ENCOUNTER — Encounter: Payer: Self-pay | Admitting: Cardiothoracic Surgery

## 2018-05-13 ENCOUNTER — Ambulatory Visit (INDEPENDENT_AMBULATORY_CARE_PROVIDER_SITE_OTHER): Payer: Self-pay | Admitting: Cardiothoracic Surgery

## 2018-05-13 VITALS — BP 100/75 | HR 73 | Resp 16 | Ht 72.0 in | Wt 209.0 lb

## 2018-05-13 DIAGNOSIS — Z951 Presence of aortocoronary bypass graft: Secondary | ICD-10-CM | POA: Diagnosis not present

## 2018-05-13 DIAGNOSIS — Z8679 Personal history of other diseases of the circulatory system: Secondary | ICD-10-CM

## 2018-05-13 DIAGNOSIS — I2510 Atherosclerotic heart disease of native coronary artery without angina pectoris: Secondary | ICD-10-CM

## 2018-05-13 NOTE — Progress Notes (Signed)
PCP is [name], MD Referring Provider is [name], MD  Chief Complaint  Patient presents with  . Routine Post Op    f/u from surgery with CXR s/p CABG x4, 04/16/18    HPI: Scheduled one month follow-up after CABG x4 for ischemic cardiomyopathy with pulmonary vein isolation-Maze procedure for history of preoperative atrial fibrillation.  Patient was discharged home in sinus rhythm on oral amiodarone, Lanoxin, Eliquis.  His heart rate and blood pressure were low so a beta-blocker was not used.  He is done well without recurrent symptoms of angina and has maintained sinus rhythm.  Surgical incisions are well-healed and his chest x-ray is clear.  His appetite is decreased and he is lost 15 pounds.  He has not resumed smoking.  He will finish his current prescription of amiodarone and then stop.  He will continue his Eliquis longer until he is seen by his cardiologist.  We will stop his Lanoxin after he finishes his current prescription as well.  He may benefit from long-term carvedilol for his moderate LV dysfunction but will leave that to his cardiology provider.  The patient is ready to start outpatient cardiac rehab, he is ready to start driving, and doing light activities but no lifting more than 20 pounds.   Past Medical History:  Diagnosis Date  . Anxiety   . Back ache   . Coronary artery disease   . History of kidney stones   . Hypotension   . Kidney stones   . Paroxysmal atrial fibrillation (HCC)    chads2vasc score is at least 1  . Syncope    due to hypotensin/ complicated by RVR with afib    Past Surgical History:  Procedure Laterality Date  . CARDIAC CATHETERIZATION    . CARDIOVERSION N/A 12/30/2014   Procedure: CARDIOVERSION;  Surgeon: Chrystie Nose, MD;  Location: [facility] ENDOSCOPY;  Service: Cardiovascular;  Laterality: N/A;  . CARDIOVERSION N/A 02/16/2018   Procedure: CARDIOVERSION;  Surgeon: Chrystie Nose, MD;  Location: [facility] ENDOSCOPY;  Service:  Cardiovascular;  Laterality: N/A;  . CLIPPING OF ATRIAL APPENDAGE Left 04/06/2018   Procedure: CLIPPING OF ATRIAL APPENDAGE USING ATRICURE ATRICLIP ACHV40;  Surgeon: Kerin Perna, MD;  Location: [facility] OR;  Service: Open Heart Surgery;  Laterality: Left;  . CORONARY ARTERY BYPASS GRAFT N/A 04/06/2018   Procedure: CORONARY ARTERY BYPASS GRAFTING (CABG) x 4 WITH ENDOSCOPIC HARVESTING OF RIGHT SAPHENOUS VEIN;  Surgeon: Kerin Perna, MD;  Location: [facility] OR;  Service: Open Heart Surgery;  Laterality: N/A;  . LEFT HEART CATH AND CORONARY ANGIOGRAPHY N/A 03/12/2018   Procedure: LEFT HEART CATH AND CORONARY ANGIOGRAPHY;  Surgeon: [name], MD;  Location: [facility] INVASIVE CV LAB;  Service: Cardiovascular;  Laterality: N/A;  . LITHOTRIPSY    . MAZE N/A 04/06/2018   Procedure: MAZE;  Surgeon: Kerin Perna, MD;  Location: [facility] OR;  Service: Open Heart Surgery;  Laterality: N/A;  . RIGHT ROTATOR CUFF    . TEE WITHOUT CARDIOVERSION N/A 12/30/2014   Procedure: TRANSESOPHAGEAL ECHOCARDIOGRAM (TEE);  Surgeon: Chrystie Nose, MD;  Location: [facility] ENDOSCOPY;  Service: Cardiovascular;  Laterality: N/A;  . TEE WITHOUT CARDIOVERSION N/A 04/06/2018   Procedure: TRANSESOPHAGEAL ECHOCARDIOGRAM (TEE);  Surgeon: Donata Clay, Theron Arista, MD;  Location: [facility] OR;  Service: Open Heart Surgery;  Laterality: N/A;    Family History  Problem Relation Age of Onset  . Heart failure Mother        mitral valve disease  . Diabetes  Father   . Heart failure Father        MI  . Hypertension Father   . CAD Father   . Alcoholism Father   . Cancer Sister     Social History Social History   Tobacco Use  . Smoking status: Former Smoker    Packs/day: 1.00    Types: Cigarettes    Last attempt to quit: 04/02/2018    Years since quitting: 0.1  . Smokeless tobacco: Former Neurosurgeon  . Tobacco comment: approx 1ppd - maybe a little less (04/18/15)  Substance Use Topics  . Alcohol use: No    Alcohol/week: 0.0 oz  . Drug use: No    Current  Outpatient Medications  Medication Sig Dispense Refill  . apixaban (ELIQUIS) 5 MG TABS tablet Take 1 tablet (5 mg total) by mouth 2 (two) times daily. Schedule MD appt for further refills 180 tablet 1  . Ascorbic Acid (VITAMIN C) 1000 MG tablet Take 1,000 mg by mouth daily.    [name] aspirin EC 81 MG EC tablet Take 1 tablet (81 mg total) by mouth daily.    [name] atorvastatin (LIPITOR) 40 MG tablet Take 1 tablet (40 mg total) by mouth daily at 6 PM. 30 tablet 3  . clonazePAM (KLONOPIN) 1 MG tablet Take 1 tablet (1 mg total) by mouth 2 (two) times daily as needed. for anxiety (Patient taking differently: Take 1 mg by mouth 2 (two) times daily. ) 60 tablet 3  . Garlic 1000 MG CAPS Take 1,000 mg by mouth daily.     . Hypromellose (ARTIFICIAL TEARS OP) Place 1 drop into both eyes 3 (three) times daily as needed (for dry eyes).     . Melatonin 1 MG TABS Take 2 mg by mouth at bedtime.     . Multiple Vitamin (MULTIVITAMIN WITH MINERALS) TABS tablet Take 1 tablet by mouth daily. One-A-Day Men's    . triamcinolone cream (KENALOG) 0.1 % Apply 1 application topically 2 (two) times daily. (Patient taking differently: Apply 1 application topically 2 (two) times daily as needed (for rough spots on ear). ) 30 g 0  . venlafaxine XR (EFFEXOR-XR) 150 MG 24 hr capsule TAKE 1 CAPSULE(150 MG) BY MOUTH DAILY WITH BREAKFAST 90 capsule 2   No current facility-administered medications for this visit.     Allergies  Allergen Reactions  . Sertraline Hcl Other (See Comments)    Extreme headaches  . Trazodone And Nefazodone Other (See Comments)    Irregular heart beat? Hypotension?    Review of Systems   Appetite improving, weight still down from preop Not smoking No fever  sleep pattern okay No edema  BP 100/75 (BP Location: Right Arm, Patient Position: Sitting, Cuff Size: Normal)   Pulse 73   Resp 16   Ht 6' (1.829 m)   Wt 209 lb (94.8 kg)   SpO2 95% Comment: RA  BMI 28.35 kg/m  Physical Exam      Exam     General- alert and comfortable    Neck- no JVD, no cervical adenopathy palpable, no carotid bruit   Lungs- clear without rales, wheezes   Cor- regular rate and rhythm, no murmur , gallop   Abdomen- soft, non-tender   Extremities - warm, non-tender, minimal edema   Neuro- oriented, appropriate, no focal weakness   Diagnostic Tests:   Chest x-ray clear i, no pleural effusion   Impression:  Excellent early recovery after CABG x4 and Maze procedure for ischemic cardiomyopathy and preoperative intermittent  atrial fibrillation. Plan: Continue Eliquis anticoagulation but stop amiodarone.  Patient may benefit from carvedilol long-term but will leave that decision to his cardiologist.  I will refer him to outpatient cardiac rehab and see him back in 8 weeks to review his progress.   Mikey BussingPeter Van Trigt III, MD [facility] [phone]

## 2018-05-13 NOTE — Addendum Note (Signed)
Addended by: Tommye StandardBURGESS, Daxx Tiggs F on: 05/13/2018 03:03 PM   Modules accepted: Orders

## 2018-05-14 ENCOUNTER — Telehealth (HOSPITAL_COMMUNITY): Payer: Self-pay

## 2018-05-14 NOTE — Telephone Encounter (Signed)
Patient returned call, went over the CR program and insurance. Patient stated he is not able to afford the program.  Closed referral

## 2018-05-14 NOTE — Telephone Encounter (Signed)
Attempted to call patient in regards to Cardiac Rehab - LM on VM 

## 2018-05-20 ENCOUNTER — Ambulatory Visit: Payer: Medicare Other | Admitting: Cardiology

## 2018-05-21 ENCOUNTER — Telehealth: Payer: Self-pay | Admitting: Internal Medicine

## 2018-05-21 DIAGNOSIS — I2510 Atherosclerotic heart disease of native coronary artery without angina pectoris: Secondary | ICD-10-CM | POA: Diagnosis not present

## 2018-05-21 DIAGNOSIS — I255 Ischemic cardiomyopathy: Secondary | ICD-10-CM | POA: Diagnosis not present

## 2018-05-21 DIAGNOSIS — Z48812 Encounter for surgical aftercare following surgery on the circulatory system: Secondary | ICD-10-CM | POA: Diagnosis not present

## 2018-05-21 DIAGNOSIS — E785 Hyperlipidemia, unspecified: Secondary | ICD-10-CM | POA: Diagnosis not present

## 2018-05-21 DIAGNOSIS — I482 Chronic atrial fibrillation: Secondary | ICD-10-CM | POA: Diagnosis not present

## 2018-05-21 DIAGNOSIS — Z951 Presence of aortocoronary bypass graft: Secondary | ICD-10-CM | POA: Diagnosis not present

## 2018-05-21 DIAGNOSIS — J449 Chronic obstructive pulmonary disease, unspecified: Secondary | ICD-10-CM | POA: Diagnosis not present

## 2018-05-21 DIAGNOSIS — M129 Arthropathy, unspecified: Secondary | ICD-10-CM | POA: Diagnosis not present

## 2018-05-21 DIAGNOSIS — Z7901 Long term (current) use of anticoagulants: Secondary | ICD-10-CM | POA: Diagnosis not present

## 2018-05-21 DIAGNOSIS — I110 Hypertensive heart disease with heart failure: Secondary | ICD-10-CM | POA: Diagnosis not present

## 2018-05-21 DIAGNOSIS — I5022 Chronic systolic (congestive) heart failure: Secondary | ICD-10-CM | POA: Diagnosis not present

## 2018-05-21 NOTE — Telephone Encounter (Signed)
Called home health, advised that orders were okay.   She stated okay she would put the orders in.

## 2018-05-21 NOTE — Telephone Encounter (Signed)
Fine with me. Thanks

## 2018-05-21 NOTE — Telephone Encounter (Signed)
Lm to call back ./cy 

## 2018-05-21 NOTE — Telephone Encounter (Signed)
Per pt is complaining of dizziness appears pt is orthostatic . At this time pt is taking time when changing positions .Will forward message to Dr Rennis GoldenHilty for review .Zack Seal/cy

## 2018-05-21 NOTE — Telephone Encounter (Signed)
We have not been giving home health orders. Looks like was in hospital and should go to ordering doctor. Has not followed up with us, perhaps cardiology has been supervising. Would need visit prior to orders if they are wanting from us.

## 2018-05-21 NOTE — Telephone Encounter (Signed)
Copied from CRM [id number]. Topic: Quick Communication - See Telephone Encounter >> May 21, 2018 11:13 AM Terisa Starraylor, Brittany L wrote: CRM for notification. See Telephone encounter for: 05/21/18.  Donita, RN with [facility] would like orders to see the patient for another five weeks. Physical therapy evaluation & his blood pressure was 100/68 sitting today and when he stood up it was 80/60. She said that he has fallen twice in the last two weeks. She said she reports no injuries. Please Advise. Call back #[phone]

## 2018-05-21 NOTE — Telephone Encounter (Signed)
Notified Donita w/MD response../lmb 

## 2018-05-21 NOTE — Telephone Encounter (Signed)
New Message:    Pt c/o BP issue: STAT if pt c/o blurred vision, one-sided weakness or slurred speech  1. What are your last 5 BP readings? 100/68:sitting 80/60:standing  2. Are you having any other symptoms (ex. Dizziness, headache, blurred vision, passed out)? Dizziness  3. What is your BP issue? She states the pt's bp is changing    donita from [facility] also states the pt has fallen twice in the last two weeks but no injuries have occurred. She is aware that the pt has an appt on Monday but wanted to share this update with Hilty.

## 2018-05-21 NOTE — Telephone Encounter (Signed)
Pt aware to stay hydrated and keep follow up on Monday ./cy

## 2018-05-21 NOTE — Telephone Encounter (Signed)
New Message    Donita with [facility] is calling to request the follow orders:   Continue home health nurse for five weeks as well as requesting physical therapy.

## 2018-05-21 NOTE — Telephone Encounter (Signed)
No meds to stop. Recommend adequate hydration and will evaluate on Monday in the office.  Dr. HRexene Edison

## 2018-05-25 ENCOUNTER — Telehealth: Payer: Self-pay | Admitting: Internal Medicine

## 2018-05-25 ENCOUNTER — Encounter: Payer: Self-pay | Admitting: Cardiology

## 2018-05-25 ENCOUNTER — Ambulatory Visit (INDEPENDENT_AMBULATORY_CARE_PROVIDER_SITE_OTHER): Payer: Medicare Other | Admitting: Cardiology

## 2018-05-25 VITALS — BP 88/56 | HR 44 | Ht 72.0 in | Wt 205.0 lb

## 2018-05-25 DIAGNOSIS — I110 Hypertensive heart disease with heart failure: Secondary | ICD-10-CM | POA: Diagnosis not present

## 2018-05-25 DIAGNOSIS — R55 Syncope and collapse: Secondary | ICD-10-CM | POA: Diagnosis not present

## 2018-05-25 DIAGNOSIS — I480 Paroxysmal atrial fibrillation: Secondary | ICD-10-CM | POA: Diagnosis not present

## 2018-05-25 DIAGNOSIS — J449 Chronic obstructive pulmonary disease, unspecified: Secondary | ICD-10-CM | POA: Diagnosis not present

## 2018-05-25 DIAGNOSIS — Z951 Presence of aortocoronary bypass graft: Secondary | ICD-10-CM

## 2018-05-25 DIAGNOSIS — I2510 Atherosclerotic heart disease of native coronary artery without angina pectoris: Secondary | ICD-10-CM | POA: Diagnosis not present

## 2018-05-25 DIAGNOSIS — Z7901 Long term (current) use of anticoagulants: Secondary | ICD-10-CM | POA: Diagnosis not present

## 2018-05-25 DIAGNOSIS — I5022 Chronic systolic (congestive) heart failure: Secondary | ICD-10-CM

## 2018-05-25 DIAGNOSIS — I951 Orthostatic hypotension: Secondary | ICD-10-CM

## 2018-05-25 DIAGNOSIS — I482 Chronic atrial fibrillation: Secondary | ICD-10-CM | POA: Diagnosis not present

## 2018-05-25 DIAGNOSIS — E785 Hyperlipidemia, unspecified: Secondary | ICD-10-CM | POA: Diagnosis not present

## 2018-05-25 DIAGNOSIS — I255 Ischemic cardiomyopathy: Secondary | ICD-10-CM | POA: Diagnosis not present

## 2018-05-25 DIAGNOSIS — M129 Arthropathy, unspecified: Secondary | ICD-10-CM | POA: Diagnosis not present

## 2018-05-25 DIAGNOSIS — Z48812 Encounter for surgical aftercare following surgery on the circulatory system: Secondary | ICD-10-CM | POA: Diagnosis not present

## 2018-05-25 MED ORDER — AMIODARONE HCL 200 MG PO TABS: 100.0000 mg | ORAL_TABLET | Freq: Every day | ORAL | 0 refills | Status: DC

## 2018-05-25 NOTE — Assessment & Plan Note (Signed)
EF pre op was 35%- check f/u echo in 2 months.

## 2018-05-25 NOTE — Patient Instructions (Addendum)
Medication Instructions:  DECREASE Amiodarone to 200mg   Once a day DISCONTINUE Digoxin  If you need a refill on your cardiac medications before your next appointment, please call your pharmacy.  Labwork: Your physician recommends that you [facility]  Testing/Procedures: None   Follow-Up: Your physician recommends that you schedule a follow-up appointment in: 2-3 weeks with Corine ShelterLuke Kilroy, PA-C  Any Other Special Instructions Will Be Listed Below (If Applicable).

## 2018-05-25 NOTE — Assessment & Plan Note (Signed)
NSR but bradycardic. He did not stop his lanoxin as directed 05/13/18

## 2018-05-25 NOTE — Telephone Encounter (Signed)
Returned call to Eureka MillKelly, PT. Patient fell today prior to PT visit. She feels patient is having orthostatic hypotension - see BPs below. Per PT, patient reported to him that he has lost about 40lbs and had has CABG. Patient has PAOV with Corine ShelterLuke Kilroy @ 3pm.   Routed to PA & Tee CMA  Provided verbal order for 2 more PT visits.

## 2018-05-25 NOTE — Progress Notes (Signed)
05/25/2018 Jeremy King   [birth year]  [id number]  Primary Physician [name], MD Primary Cardiologist: [name]   HPI:  67 y/o male followed by [name] with a history of PAF, orthostatic hypotension and syncope. The pt had done well for a number of years, holding NSR on Multaq. He had recurrent PAF in May 2019. DCCV was done 02/16/18 but the pt had recurrent AF. [name] was concerned there may be underlying ischemia which triggered his recurrent PAF. Myoview done 03/09/18 showed some ischemia and the patient had a cath 03/12/18. 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 He underwent CABG x 4 04/06/18 with an LIMA-LAD, SVG-PDA, SVG-RI, SVG-Dx. Post op he lost 15 lbs from pre op weight-(? CHF pre op). He was discharged on Amiodarone loading and Lanoxin, no beta blocker secondary to chronic hypotension. He saw Dr PVT on 05/13/18. His HR was slow and he was instructed to stop his Lanoxin and stop his Amiodarone when his Rx runs out.   Today he tells me he has had near syncope x 3 since surgery. Today he fell and injured his nose and has a bruise on his chest. He says he got up too fast and could tell he was going dow, he never actually lost consciousness.  In the office his HR is 44. His B/P is 92/ 56. On careful review of his medications it turns out he never did stop the Lanoxin. We had thought he was on Amiodarone 400 mg daily but he was actually taking 200 mg daily. He denies any nausea, vomiting, or anorexia.  He detailed his diet with me and it sounds adequate.    Current Outpatient Medications  Medication Sig Dispense Refill  . amiodarone (PACERONE) 200 MG tablet Take 0.5 tablets (100 mg total) by mouth daily. 30 tablet 0  . apixaban (ELIQUIS) 5 MG TABS tablet Take 1 tablet (5 mg total) by mouth 2 (two) times daily. Schedule MD appt for further refills 180 tablet 1  . Ascorbic Acid (VITAMIN C) 1000 MG tablet Take 1,000 mg by mouth daily.    [name] aspirin EC 81 MG EC tablet Take 1  tablet (81 mg total) by mouth daily.    [name] atorvastatin (LIPITOR) 40 MG tablet Take 1 tablet (40 mg total) by mouth daily at 6 PM. 30 tablet 3  . clonazePAM (KLONOPIN) 1 MG tablet Take 1 tablet (1 mg total) by mouth 2 (two) times daily as needed. for anxiety (Patient taking differently: Take 1 mg by mouth 2 (two) times daily. ) 60 tablet 3  . Garlic 1000 MG CAPS Take 1,000 mg by mouth daily.     . Hypromellose (ARTIFICIAL TEARS OP) Place 1 drop into both eyes 3 (three) times daily as needed (for dry eyes).     . metoprolol succinate (TOPROL-XL) 25 MG 24 hr tablet Take 12.5 mg by mouth 2 (two) times daily.    . Multiple Vitamin (MULTIVITAMIN WITH MINERALS) TABS tablet Take 1 tablet by mouth daily. One-A-Day Men's    . triamcinolone cream (KENALOG) 0.1 % Apply 1 application topically 2 (two) times daily. (Patient taking differently: Apply 1 application topically 2 (two) times daily as needed (for rough spots on ear). ) 30 g 0  . venlafaxine XR (EFFEXOR-XR) 150 MG 24 hr capsule TAKE 1 CAPSULE(150 MG) BY MOUTH DAILY WITH BREAKFAST 90 capsule 2   No current facility-administered medications for this visit.     Allergies  Allergen  Reactions  . Sertraline Hcl Other (See Comments)    Extreme headaches  . Trazodone And Nefazodone Other (See Comments)    Irregular heart beat? Hypotension?    Past Medical History:  Diagnosis Date  . Anxiety   . Back ache   . Coronary artery disease   . History of kidney stones   . Hypotension   . Kidney stones   . Paroxysmal atrial fibrillation (HCC)    chads2vasc score is at least 1  . Syncope    due to hypotensin/ complicated by RVR with afib    Social History   Socioeconomic History  . Marital status: Significant Other    Spouse name: Not on file  . Number of children: 2  . Years of education: 6115  . Highest education level: Not on file  Occupational History  . Not on file  Social Needs  . Financial resource strain: Not on file  . Food  insecurity:    Worry: Not on file    Inability: Not on file  . Transportation needs:    Medical: Not on file    Non-medical: Not on file  Tobacco Use  . Smoking status: Former Smoker    Packs/day: 1.00    Types: Cigarettes    Last attempt to quit: 04/02/2018    Years since quitting: 0.1  . Smokeless tobacco: Former NeurosurgeonUser  . Tobacco comment: approx 1ppd - maybe a little less (04/18/15)  Substance and Sexual Activity  . Alcohol use: No    Alcohol/week: 0.0 standard drinks  . Drug use: No  . Sexual activity: Not on file  Lifestyle  . Physical activity:    Days per week: Not on file    Minutes per session: Not on file  . Stress: Not on file  Relationships  . Social connections:    Talks on phone: Not on file    Gets together: Not on file    Attends religious service: Not on file    Active member of club or organization: Not on file    Attends meetings of clubs or organizations: Not on file    Relationship status: Not on file  . Intimate partner violence:    Fear of current or ex partner: Not on file    Emotionally abused: Not on file    Physically abused: Not on file    Forced sexual activity: Not on file  Other Topics Concern  . Not on file  Social History Narrative  . Not on file     Family History  Problem Relation Age of Onset  . Heart failure Mother        mitral valve disease  . Diabetes Father   . Heart failure Father        MI  . Hypertension Father   . CAD Father   . Alcoholism Father   . Cancer Sister      Review of Systems: General: negative for chills, fever, night sweats or weight changes.  Cardiovascular: negative for chest pain, dyspnea on exertion, edema, orthopnea, palpitations, paroxysmal nocturnal dyspnea or shortness of breath Dermatological: negative for rash Respiratory: negative for cough or wheezing Urologic: negative for hematuria Abdominal: negative for nausea, vomiting, diarrhea, bright red blood per rectum, melena, or  hematemesis Neurologic: negative for visual changes, syncope, or dizziness All other systems reviewed and are otherwise negative except as noted above.    Blood pressure (!) 88/56, pulse (!) 44, height 6' (1.829 m), weight 205 lb (93 kg).  General appearance: alert, cooperative and no distress Lungs: clear to auscultation bilaterally Heart: regular rate and rhythm and slow rate Extremities: no edema Skin: pale cool dry Neurologic: Grossly normal  EKG NSR, SB 44, RBBB  ASSESSMENT AND PLAN:   Syncope and collapse Sounds orthostatic which he has had before but could also be secondary to bradycardia.  PAF (paroxysmal atrial fibrillation) (HCC) NSR but bradycardic. He did not stop his lanoxin as directed 05/13/18  S/P CABG x 4 CABG x 4 04/06/18 LIMA-LAD SVG-PDA SVG-RI SVG-Dx  Chronic systolic HF (heart failure) (HCC) EF pre op was 35%- check f/u echo in 2 months.   Hypotension Chronic   PLAN  Stop Lanoxin. Decrease Amiodarone to 100 mg daily. Check CBC and BMP.  F/U with EKG in two weeks.  Discussed with Dr Rennis GoldenHilty in the office today.   Corine ShelterLuke Romy Mcgue PA-C 05/25/2018 4:03 PM

## 2018-05-25 NOTE — Assessment & Plan Note (Signed)
CABG x 4 04/06/18 LIMA-LAD SVG-PDA SVG-RI SVG-Dx

## 2018-05-25 NOTE — Assessment & Plan Note (Signed)
Sounds orthostatic which he has had before but could also be secondary to bradycardia.

## 2018-05-25 NOTE — Assessment & Plan Note (Signed)
Chronic. 

## 2018-05-25 NOTE — Telephone Encounter (Signed)
Pt c/o BP issue:  1. What are your last 5 BP readings? In sitting 90/58 pulse 66 after about 2 min of sitting 88/58 pulse 68 in standing 76/50  2. Are you having any other symptoms (ex. Dizziness, headache, blurred vision, passed out)? Pass out this morning patient hit his chest and nose 3. What is your medication issue? Patient is bring all medication with him today. Physical Therapist  will visit patient about 2 more times.

## 2018-05-26 DIAGNOSIS — Z951 Presence of aortocoronary bypass graft: Secondary | ICD-10-CM | POA: Diagnosis not present

## 2018-05-26 DIAGNOSIS — Z48812 Encounter for surgical aftercare following surgery on the circulatory system: Secondary | ICD-10-CM | POA: Diagnosis not present

## 2018-05-26 DIAGNOSIS — E785 Hyperlipidemia, unspecified: Secondary | ICD-10-CM | POA: Diagnosis not present

## 2018-05-26 DIAGNOSIS — I110 Hypertensive heart disease with heart failure: Secondary | ICD-10-CM | POA: Diagnosis not present

## 2018-05-26 DIAGNOSIS — Z7901 Long term (current) use of anticoagulants: Secondary | ICD-10-CM | POA: Diagnosis not present

## 2018-05-26 DIAGNOSIS — M129 Arthropathy, unspecified: Secondary | ICD-10-CM | POA: Diagnosis not present

## 2018-05-26 DIAGNOSIS — J449 Chronic obstructive pulmonary disease, unspecified: Secondary | ICD-10-CM | POA: Diagnosis not present

## 2018-05-26 DIAGNOSIS — I482 Chronic atrial fibrillation: Secondary | ICD-10-CM | POA: Diagnosis not present

## 2018-05-26 DIAGNOSIS — I255 Ischemic cardiomyopathy: Secondary | ICD-10-CM | POA: Diagnosis not present

## 2018-05-26 DIAGNOSIS — I2510 Atherosclerotic heart disease of native coronary artery without angina pectoris: Secondary | ICD-10-CM | POA: Diagnosis not present

## 2018-05-26 DIAGNOSIS — I5022 Chronic systolic (congestive) heart failure: Secondary | ICD-10-CM | POA: Diagnosis not present

## 2018-05-26 LAB — BASIC METABOLIC PANEL
BUN/Creatinine Ratio: 12 (ref 10–24)
BUN: 16 mg/dL (ref 8–27)
CO2: 24 mmol/L (ref 20–29)
Calcium: 9.9 mg/dL (ref 8.6–10.2)
Chloride: 104 mmol/L (ref 96–106)
Creatinine, Ser: 1.31 mg/dL — ABNORMAL HIGH (ref 0.76–1.27)
GFR calc Af Amer: 64 mL/min/{1.73_m2} (ref >59)
GFR calc non Af Amer: 56 mL/min/{1.73_m2} — ABNORMAL LOW (ref >59)
Glucose: 81 mg/dL (ref 65–99)
Potassium: 5 mmol/L (ref 3.5–5.2)
Sodium: 144 mmol/L (ref 134–144)

## 2018-05-26 LAB — CBC WITH DIFFERENTIAL/PLATELET
Basophils Absolute: 0 10*3/uL (ref 0.0–0.2)
Basos: 0 %
EOS (ABSOLUTE): 0 10*3/uL (ref 0.0–0.4)
Eos: 0 %
Hematocrit: 44.3 % (ref 37.5–51.0)
Hemoglobin: 15 g/dL (ref 13.0–17.7)
Immature Grans (Abs): 0.1 10*3/uL (ref 0.0–0.1)
Immature Granulocytes: 1 %
Lymphocytes Absolute: 1.9 10*3/uL (ref 0.7–3.1)
Lymphs: 22 %
MCH: 31.8 pg (ref 26.6–33.0)
MCHC: 33.9 g/dL (ref 31.5–35.7)
MCV: 94 fL (ref 79–97)
Monocytes Absolute: 1 10*3/uL — ABNORMAL HIGH (ref 0.1–0.9)
Monocytes: 11 %
Neutrophils Absolute: 5.8 10*3/uL (ref 1.4–7.0)
Neutrophils: 66 %
Platelets: 236 10*3/uL (ref 150–450)
RBC: 4.71 x10E6/uL (ref 4.14–5.80)
RDW: 13.3 % (ref 12.3–15.4)
WBC: 8.8 10*3/uL (ref 3.4–10.8)

## 2018-05-28 DIAGNOSIS — E785 Hyperlipidemia, unspecified: Secondary | ICD-10-CM | POA: Diagnosis not present

## 2018-05-28 DIAGNOSIS — I2510 Atherosclerotic heart disease of native coronary artery without angina pectoris: Secondary | ICD-10-CM | POA: Diagnosis not present

## 2018-05-28 DIAGNOSIS — I255 Ischemic cardiomyopathy: Secondary | ICD-10-CM | POA: Diagnosis not present

## 2018-05-28 DIAGNOSIS — Z48812 Encounter for surgical aftercare following surgery on the circulatory system: Secondary | ICD-10-CM | POA: Diagnosis not present

## 2018-05-28 DIAGNOSIS — M129 Arthropathy, unspecified: Secondary | ICD-10-CM | POA: Diagnosis not present

## 2018-05-28 DIAGNOSIS — Z951 Presence of aortocoronary bypass graft: Secondary | ICD-10-CM | POA: Diagnosis not present

## 2018-05-28 DIAGNOSIS — I482 Chronic atrial fibrillation: Secondary | ICD-10-CM | POA: Diagnosis not present

## 2018-05-28 DIAGNOSIS — Z7901 Long term (current) use of anticoagulants: Secondary | ICD-10-CM | POA: Diagnosis not present

## 2018-05-28 DIAGNOSIS — I5022 Chronic systolic (congestive) heart failure: Secondary | ICD-10-CM | POA: Diagnosis not present

## 2018-05-28 DIAGNOSIS — J449 Chronic obstructive pulmonary disease, unspecified: Secondary | ICD-10-CM | POA: Diagnosis not present

## 2018-05-28 DIAGNOSIS — I110 Hypertensive heart disease with heart failure: Secondary | ICD-10-CM | POA: Diagnosis not present

## 2018-05-29 ENCOUNTER — Other Ambulatory Visit: Payer: Self-pay | Admitting: Cardiology

## 2018-05-29 DIAGNOSIS — I480 Paroxysmal atrial fibrillation: Secondary | ICD-10-CM

## 2018-06-02 ENCOUNTER — Telehealth: Payer: Self-pay | Admitting: Internal Medicine

## 2018-06-02 DIAGNOSIS — Z48812 Encounter for surgical aftercare following surgery on the circulatory system: Secondary | ICD-10-CM | POA: Diagnosis not present

## 2018-06-02 DIAGNOSIS — I2510 Atherosclerotic heart disease of native coronary artery without angina pectoris: Secondary | ICD-10-CM | POA: Diagnosis not present

## 2018-06-02 DIAGNOSIS — M129 Arthropathy, unspecified: Secondary | ICD-10-CM | POA: Diagnosis not present

## 2018-06-02 DIAGNOSIS — J449 Chronic obstructive pulmonary disease, unspecified: Secondary | ICD-10-CM | POA: Diagnosis not present

## 2018-06-02 DIAGNOSIS — I5022 Chronic systolic (congestive) heart failure: Secondary | ICD-10-CM | POA: Diagnosis not present

## 2018-06-02 DIAGNOSIS — I110 Hypertensive heart disease with heart failure: Secondary | ICD-10-CM | POA: Diagnosis not present

## 2018-06-02 DIAGNOSIS — I255 Ischemic cardiomyopathy: Secondary | ICD-10-CM | POA: Diagnosis not present

## 2018-06-02 DIAGNOSIS — Z951 Presence of aortocoronary bypass graft: Secondary | ICD-10-CM | POA: Diagnosis not present

## 2018-06-02 DIAGNOSIS — Z7901 Long term (current) use of anticoagulants: Secondary | ICD-10-CM | POA: Diagnosis not present

## 2018-06-02 DIAGNOSIS — I482 Chronic atrial fibrillation: Secondary | ICD-10-CM | POA: Diagnosis not present

## 2018-06-02 DIAGNOSIS — E785 Hyperlipidemia, unspecified: Secondary | ICD-10-CM | POA: Diagnosis not present

## 2018-06-02 NOTE — Telephone Encounter (Signed)
[facility] [place] [facility]   She is calling to report low BP readings on patient this AM.     Sitting BP 90/60  Standing 82/56 HR 50-68  Patient is asymptomatic, denies dizziness, lightheadedness, no falls.   She advised patient to increase hydration (he was only drinking 1 glass of water a day), take BP x 3/day and record.    She states nurse will be out to see him tomorrow or Thursday to reassess.     Per chart review: OV 8/19 with Corine ShelterLuke Kilroy, BP 88/56 HR 44, medication changes made.   Upcoming visit 9/3.    Advised to have nurse call to report BP readings when she visits tomorrow and advised would make PA/MD aware.

## 2018-06-02 NOTE — Telephone Encounter (Signed)
Thanks-agree.  Corine ShelterLUKE Linette Gunderson PA-C 06/02/2018 4:13 PM

## 2018-06-03 ENCOUNTER — Telehealth: Payer: Self-pay | Admitting: Internal Medicine

## 2018-06-03 DIAGNOSIS — I482 Chronic atrial fibrillation: Secondary | ICD-10-CM | POA: Diagnosis not present

## 2018-06-03 DIAGNOSIS — Z48812 Encounter for surgical aftercare following surgery on the circulatory system: Secondary | ICD-10-CM | POA: Diagnosis not present

## 2018-06-03 DIAGNOSIS — Z7901 Long term (current) use of anticoagulants: Secondary | ICD-10-CM | POA: Diagnosis not present

## 2018-06-03 DIAGNOSIS — I2510 Atherosclerotic heart disease of native coronary artery without angina pectoris: Secondary | ICD-10-CM | POA: Diagnosis not present

## 2018-06-03 DIAGNOSIS — I255 Ischemic cardiomyopathy: Secondary | ICD-10-CM | POA: Diagnosis not present

## 2018-06-03 DIAGNOSIS — J449 Chronic obstructive pulmonary disease, unspecified: Secondary | ICD-10-CM | POA: Diagnosis not present

## 2018-06-03 DIAGNOSIS — M129 Arthropathy, unspecified: Secondary | ICD-10-CM | POA: Diagnosis not present

## 2018-06-03 DIAGNOSIS — Z951 Presence of aortocoronary bypass graft: Secondary | ICD-10-CM | POA: Diagnosis not present

## 2018-06-03 DIAGNOSIS — E785 Hyperlipidemia, unspecified: Secondary | ICD-10-CM | POA: Diagnosis not present

## 2018-06-03 DIAGNOSIS — I5022 Chronic systolic (congestive) heart failure: Secondary | ICD-10-CM | POA: Diagnosis not present

## 2018-06-03 DIAGNOSIS — I110 Hypertensive heart disease with heart failure: Secondary | ICD-10-CM | POA: Diagnosis not present

## 2018-06-03 MED ORDER — METOPROLOL SUCCINATE ER 25 MG PO TB24: 12.5000 mg | ORAL_TABLET | Freq: Every day | ORAL | 3 refills | Status: DC

## 2018-06-03 NOTE — Telephone Encounter (Signed)
New Message    Noreene LarssonJill [facility] is calling to report bp: Sitting 102/70  Standing 90/58  And pulse 72-90 when stood up and did not take his night bp meds last night and she suggested that the pt not to take it until they here from the doctor. So pt is holding his bp medication.Please call

## 2018-06-03 NOTE — Telephone Encounter (Signed)
Ok to decrease his Toprol to 12.5 mg daily.   Corine ShelterLUKE Dylann Layne PA-C 06/03/2018 3:56 PM

## 2018-06-03 NOTE — Telephone Encounter (Signed)
Noreene LarssonJill with [facility] called to report that the pt has been orthostatic.[name]. Yesterday his BP was 102/70 and then standing 90/58... She said the therapist took the readings so she is not sure if the standing BP was immediate or not. His HR has been 72-90 at all of her visits.  She advised the pt to hold his metoprolol 12.5mg  bid until after she consults with Corine ShelterLuke Kilroy..she says the pt is asymptomatic. I advised her that I will forward the message on to [facility] Kilroy PA and the pt is also seeing him on 06/09/18.

## 2018-06-03 NOTE — Telephone Encounter (Signed)
Noreene LarssonJill with [facility] given Susette RacerLuke Kilroy's recommendation and pt to keep 06/09/18 appt.

## 2018-06-08 ENCOUNTER — Other Ambulatory Visit: Payer: Self-pay | Admitting: Internal Medicine

## 2018-06-09 ENCOUNTER — Encounter: Payer: Self-pay | Admitting: Cardiology

## 2018-06-09 ENCOUNTER — Ambulatory Visit (INDEPENDENT_AMBULATORY_CARE_PROVIDER_SITE_OTHER): Payer: Medicare Other | Admitting: Cardiology

## 2018-06-09 VITALS — BP 98/70 | HR 86 | Ht 72.0 in | Wt 206.4 lb

## 2018-06-09 DIAGNOSIS — I951 Orthostatic hypotension: Secondary | ICD-10-CM

## 2018-06-09 DIAGNOSIS — Z951 Presence of aortocoronary bypass graft: Secondary | ICD-10-CM | POA: Diagnosis not present

## 2018-06-09 DIAGNOSIS — I480 Paroxysmal atrial fibrillation: Secondary | ICD-10-CM | POA: Diagnosis not present

## 2018-06-09 MED ORDER — FLUDROCORTISONE ACETATE 0.1 MG PO TABS: 0.1000 mg | ORAL_TABLET | Freq: Every day | ORAL | 2 refills | Status: DC

## 2018-06-09 NOTE — Progress Notes (Signed)
06/09/2018 Jeremy King   [birth year]  [id number]  Primary Physician [name], MD Primary Cardiologist: [name]  HPI:  67 y/o male followed by [name] with a history of PAF, orthostatic hypotension and syncope. The pt had done well for a number of years, holding NSR on Multaq. He had recurrent PAF in May 2019. DCCV was done 02/16/18 but the pt had recurrent AF. The plan was to stop Multaq and try another antiarrythmic.  [name] was concerned there may be underlying ischemia which triggered his recurrent PAF. A Myoview was done 03/09/18 and showed ischemia and the patient had a cath 03/12/18. 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 He underwent CABG x 4 04/06/18 with an LIMA-LAD, SVG-PDA, SVG-RI, SVG-Dx. Post op he lost 15 lbs from pre op weight-(? CHF pre op). He was discharged on Amiodarone loading and Lanoxin. He saw Dr PVT on 05/13/18. His HR was slow and he was instructed to stop his Lanoxin and stop his Amiodarone when his Rx runs out.   We saw him in follow up 05/25/18.  He related a history of near syncope x 3 since surgery.  In the office his HR was 44. His B/P is 92/ 56. On careful review of his medications it turns out he never did stop the Lanoxin. We had thought he was on Amiodarone 400 mg daily but he was actually taking 200 mg daily. He denies any nausea, vomiting, or anorexia.  He detailed his diet with me and it sounds adequate.  Today he returns for follow up. Now we find out that he is taking Toprol at home though he was not discharged on this. He continues to have orthostatic hypotension and near syncope. His HR is stable 60-80.    Current Outpatient Medications  Medication Sig Dispense Refill  . amiodarone (PACERONE) 200 MG tablet TAKE 1/2 TABLET(100 MG) BY MOUTH DAILY 45 tablet 0  . apixaban (ELIQUIS) 5 MG TABS tablet Take 1 tablet (5 mg total) by mouth 2 (two) times daily. Schedule MD appt for further refills 180 tablet 1  . Ascorbic Acid (VITAMIN C) 1000 MG  tablet Take 1,000 mg by mouth daily.    [name] aspirin EC 81 MG EC tablet Take 1 tablet (81 mg total) by mouth daily.    [name] atorvastatin (LIPITOR) 40 MG tablet Take 1 tablet (40 mg total) by mouth daily at 6 PM. 30 tablet 3  . clonazePAM (KLONOPIN) 1 MG tablet Take 1 tablet (1 mg total) by mouth 2 (two) times daily as needed. for anxiety (Patient taking differently: Take 1 mg by mouth 2 (two) times daily. ) 60 tablet 3  . Garlic 1000 MG CAPS Take 1,000 mg by mouth daily.     . Hypromellose (ARTIFICIAL TEARS OP) Place 1 drop into both eyes 3 (three) times daily as needed (for dry eyes).     . metoprolol succinate (TOPROL-XL) 25 MG 24 hr tablet Take 0.5 tablets (12.5 mg total) by mouth daily. 45 tablet 3  . Multiple Vitamin (MULTIVITAMIN WITH MINERALS) TABS tablet Take 1 tablet by mouth daily. One-A-Day Men's    . triamcinolone cream (KENALOG) 0.1 % Apply 1 application topically 2 (two) times daily. (Patient taking differently: Apply 1 application topically 2 (two) times daily as needed (for rough spots on ear). ) 30 g 0  . venlafaxine XR (EFFEXOR-XR) 150 MG 24 hr capsule TAKE 1 CAPSULE(150 MG) BY MOUTH DAILY WITH BREAKFAST 90 capsule 2  No current facility-administered medications for this visit.     Allergies  Allergen Reactions  . Sertraline Hcl Other (See Comments)    Extreme headaches  . Trazodone And Nefazodone Other (See Comments)    Irregular heart beat? Hypotension?    Past Medical History:  Diagnosis Date  . Anxiety   . Back ache   . Coronary artery disease   . History of kidney stones   . Hypotension   . Kidney stones   . Paroxysmal atrial fibrillation (HCC)    chads2vasc score is at least 1  . Syncope    due to hypotensin/ complicated by RVR with afib    Social History   Socioeconomic History  . Marital status: Significant Other    Spouse name: Not on file  . Number of children: 2  . Years of education: 71  . Highest education level: Not on file  Occupational History    . Not on file  Social Needs  . Financial resource strain: Not on file  . Food insecurity:    Worry: Not on file    Inability: Not on file  . Transportation needs:    Medical: Not on file    Non-medical: Not on file  Tobacco Use  . Smoking status: Former Smoker    Packs/day: 1.00    Types: Cigarettes    Last attempt to quit: 04/02/2018    Years since quitting: 0.1  . Smokeless tobacco: Former Neurosurgeon  . Tobacco comment: approx 1ppd - maybe a little less (04/18/15)  Substance and Sexual Activity  . Alcohol use: No    Alcohol/week: 0.0 standard drinks  . Drug use: No  . Sexual activity: Not on file  Lifestyle  . Physical activity:    Days per week: Not on file    Minutes per session: Not on file  . Stress: Not on file  Relationships  . Social connections:    Talks on phone: Not on file    Gets together: Not on file    Attends religious service: Not on file    Active member of club or organization: Not on file    Attends meetings of clubs or organizations: Not on file    Relationship status: Not on file  . Intimate partner violence:    Fear of current or ex partner: Not on file    Emotionally abused: Not on file    Physically abused: Not on file    Forced sexual activity: Not on file  Other Topics Concern  . Not on file  Social History Narrative  . Not on file     Family History  Problem Relation Age of Onset  . Heart failure Mother        mitral valve disease  . Diabetes Father   . Heart failure Father        MI  . Hypertension Father   . CAD Father   . Alcoholism Father   . Cancer Sister      Review of Systems: General: negative for chills, fever, night sweats or weight changes.  Cardiovascular: negative for chest pain, dyspnea on exertion, edema, orthopnea, palpitations, paroxysmal nocturnal dyspnea or shortness of breath Dermatological: negative for rash Respiratory: negative for cough or wheezing Urologic: negative for hematuria Abdominal: negative for  nausea, vomiting, diarrhea, bright red blood per rectum, melena, or hematemesis Neurologic: negative for visual changes, syncope, or dizziness All other systems reviewed and are otherwise negative except as noted above.    Blood  pressure 98/70, pulse 86, height 6' (1.829 m), weight 206 lb 6.4 oz (93.6 kg), SpO2 97 %.  General appearance: alert, cooperative and no distress Neck: no JVD Lungs: clear to auscultation bilaterally Heart: regular rate and rhythm Extremities: no edema Skin: Skin color, texture, turgor normal. No rashes or lesions Neurologic: Grossly normal   ASSESSMENT AND PLAN:   Syncope and collapse Sounds orthostatic- add Florinef, stop Toprol  PAF (paroxysmal atrial fibrillation) (HCC) NSR on low dose Amiodarone.   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   Hypotension Chronic-he is orthostatic in the office toady- 101/68 laying, 82/60 standing.   PLAN  Very confusing medication picture since he was discharged as his medications are never clear and keep changing. I have asked him to bring all his medications with him next office visit.   Stop Toprol, add Florinef 0.1 mg. Consider continuing Amiodarone long term- (Dr Morton Peters had mentioned stopping this when his Rx ran out). F/U with [name] to be arranged.   Corine Shelter PA-C 06/09/2018 2:42 PM

## 2018-06-09 NOTE — Patient Instructions (Addendum)
Medication Instructions:  Start Florinef 0.1 mg daily Stop Metoprolol 25 mg.  If you need a refill on your cardiac medications before your next appointment, please call your pharmacy.  Labwork: None Ordered.  Testing/Procedures: None Ordered.  Follow-Up: Your physician wants you to follow-up in: 4-6 weeks with Dr.Hilty ONLY. Bring ALL medications to that visit that you currently take.   Thank you for choosing [facility] at [facility]!!

## 2018-06-09 NOTE — Telephone Encounter (Signed)
[place] Controlled Substance Database checked. Last filled on 05/03/18  Last routine OV: 10/17/17

## 2018-06-12 DIAGNOSIS — Z7901 Long term (current) use of anticoagulants: Secondary | ICD-10-CM | POA: Diagnosis not present

## 2018-06-12 DIAGNOSIS — I482 Chronic atrial fibrillation: Secondary | ICD-10-CM | POA: Diagnosis not present

## 2018-06-12 DIAGNOSIS — I5022 Chronic systolic (congestive) heart failure: Secondary | ICD-10-CM | POA: Diagnosis not present

## 2018-06-12 DIAGNOSIS — Z48812 Encounter for surgical aftercare following surgery on the circulatory system: Secondary | ICD-10-CM | POA: Diagnosis not present

## 2018-06-12 DIAGNOSIS — J449 Chronic obstructive pulmonary disease, unspecified: Secondary | ICD-10-CM | POA: Diagnosis not present

## 2018-06-12 DIAGNOSIS — I110 Hypertensive heart disease with heart failure: Secondary | ICD-10-CM | POA: Diagnosis not present

## 2018-06-12 DIAGNOSIS — E785 Hyperlipidemia, unspecified: Secondary | ICD-10-CM | POA: Diagnosis not present

## 2018-06-12 DIAGNOSIS — I2510 Atherosclerotic heart disease of native coronary artery without angina pectoris: Secondary | ICD-10-CM | POA: Diagnosis not present

## 2018-06-12 DIAGNOSIS — Z951 Presence of aortocoronary bypass graft: Secondary | ICD-10-CM | POA: Diagnosis not present

## 2018-06-12 DIAGNOSIS — I255 Ischemic cardiomyopathy: Secondary | ICD-10-CM | POA: Diagnosis not present

## 2018-06-12 DIAGNOSIS — M129 Arthropathy, unspecified: Secondary | ICD-10-CM | POA: Diagnosis not present

## 2018-06-18 DIAGNOSIS — I2510 Atherosclerotic heart disease of native coronary artery without angina pectoris: Secondary | ICD-10-CM | POA: Diagnosis not present

## 2018-06-18 DIAGNOSIS — J449 Chronic obstructive pulmonary disease, unspecified: Secondary | ICD-10-CM | POA: Diagnosis not present

## 2018-06-18 DIAGNOSIS — I255 Ischemic cardiomyopathy: Secondary | ICD-10-CM | POA: Diagnosis not present

## 2018-06-18 DIAGNOSIS — M129 Arthropathy, unspecified: Secondary | ICD-10-CM | POA: Diagnosis not present

## 2018-06-18 DIAGNOSIS — I5022 Chronic systolic (congestive) heart failure: Secondary | ICD-10-CM | POA: Diagnosis not present

## 2018-06-18 DIAGNOSIS — Z7901 Long term (current) use of anticoagulants: Secondary | ICD-10-CM | POA: Diagnosis not present

## 2018-06-18 DIAGNOSIS — E785 Hyperlipidemia, unspecified: Secondary | ICD-10-CM | POA: Diagnosis not present

## 2018-06-18 DIAGNOSIS — Z48812 Encounter for surgical aftercare following surgery on the circulatory system: Secondary | ICD-10-CM | POA: Diagnosis not present

## 2018-06-18 DIAGNOSIS — I482 Chronic atrial fibrillation: Secondary | ICD-10-CM | POA: Diagnosis not present

## 2018-06-18 DIAGNOSIS — I110 Hypertensive heart disease with heart failure: Secondary | ICD-10-CM | POA: Diagnosis not present

## 2018-06-18 DIAGNOSIS — Z951 Presence of aortocoronary bypass graft: Secondary | ICD-10-CM | POA: Diagnosis not present

## 2018-07-10 ENCOUNTER — Other Ambulatory Visit: Payer: Self-pay | Admitting: Internal Medicine

## 2018-07-15 ENCOUNTER — Encounter: Payer: Self-pay | Admitting: Cardiothoracic Surgery

## 2018-07-15 ENCOUNTER — Other Ambulatory Visit: Payer: Self-pay

## 2018-07-15 ENCOUNTER — Ambulatory Visit: Payer: Medicare Other | Admitting: Internal Medicine

## 2018-07-15 ENCOUNTER — Ambulatory Visit (INDEPENDENT_AMBULATORY_CARE_PROVIDER_SITE_OTHER): Payer: Self-pay | Admitting: Cardiothoracic Surgery

## 2018-07-15 VITALS — BP 108/68 | HR 77 | Resp 18 | Ht 72.0 in | Wt 210.2 lb

## 2018-07-15 DIAGNOSIS — Z951 Presence of aortocoronary bypass graft: Secondary | ICD-10-CM

## 2018-07-15 NOTE — Progress Notes (Signed)
PCP is Jeremy Broker, MD Referring Provider is [name], MD  Chief Complaint  Patient presents with  . Routine Post Op    f/u, s/p CABG x4  . Coronary Artery Disease    HPI: Patient presents for final 52-month follow-up after multivessel CABG with combined Maze procedure for PAF.  Patient's recovery has been slow because of orthostatic dizziness and bradycardic arrhythmias.  He is now doing much better without shortness of breath, with good appetite and strength, and without orthostatic dizziness or presyncope.  His amiodarone has been reduced to 100 mg a day and he remains on Eliquis.  He also takes aspirin 81 mg and Lipitor 80 mg.  He has had no recurrent angina.  Surgical incision is well-healed.  Postop chest x-ray is clear. He is scheduled to see Dr. Rennis Golden in the next few days. Past Medical History:  Diagnosis Date  . Anxiety   . Back ache   . Coronary artery disease   . History of kidney stones   . Hypotension   . Kidney stones   . Paroxysmal atrial fibrillation (HCC)    chads2vasc score is at least 1  . Syncope    due to hypotensin/ complicated by RVR with afib    Past Surgical History:  Procedure Laterality Date  . CARDIAC CATHETERIZATION    . CARDIOVERSION N/A 12/30/2014   Procedure: CARDIOVERSION;  Surgeon: Chrystie Nose, MD;  Location: [facility] ENDOSCOPY;  Service: Cardiovascular;  Laterality: N/A;  . CARDIOVERSION N/A 02/16/2018   Procedure: CARDIOVERSION;  Surgeon: Chrystie Nose, MD;  Location: [facility] ENDOSCOPY;  Service: Cardiovascular;  Laterality: N/A;  . CLIPPING OF ATRIAL APPENDAGE Left 04/06/2018   Procedure: CLIPPING OF ATRIAL APPENDAGE USING ATRICURE ATRICLIP ACHV40;  Surgeon: Kerin Perna, MD;  Location: [facility] OR;  Service: Open Heart Surgery;  Laterality: Left;  . CORONARY ARTERY BYPASS GRAFT N/A 04/06/2018   Procedure: CORONARY ARTERY BYPASS GRAFTING (CABG) x 4 WITH ENDOSCOPIC HARVESTING OF RIGHT SAPHENOUS VEIN;  Surgeon: Kerin Perna, MD;  Location:  [facility] OR;  Service: Open Heart Surgery;  Laterality: N/A;  . LEFT HEART CATH AND CORONARY ANGIOGRAPHY N/A 03/12/2018   Procedure: LEFT HEART CATH AND CORONARY ANGIOGRAPHY;  Surgeon: [name], MD;  Location: [facility] INVASIVE CV LAB;  Service: Cardiovascular;  Laterality: N/A;  . LITHOTRIPSY    . MAZE N/A 04/06/2018   Procedure: MAZE;  Surgeon: Kerin Perna, MD;  Location[facility] OR;  Service: Open Heart Surgery;  Laterality: N/A;  . RIGHT ROTATOR CUFF    . TEE WITHOUT CARDIOVERSION N/A 12/30/2014   Procedure: TRANSESOPHAGEAL ECHOCARDIOGRAM (TEE);  Surgeon: Chrystie Nose, MD;  Location: [facility] ENDOSCOPY;  Service: Cardiovascular;  Laterality: N/A;  . TEE WITHOUT CARDIOVERSION N/A 04/06/2018   Procedure: TRANSESOPHAGEAL ECHOCARDIOGRAM (TEE);  Surgeon: Donata Clay, Theron Arista, MD;  Location: [facility] OR;  Service: Open Heart Surgery;  Laterality: N/A;    Family History  Problem Relation Age of Onset  . Heart failure Mother        mitral valve disease  . Diabetes Father   . Heart failure Father        MI  . Hypertension Father   . CAD Father   . Alcoholism Father   . Cancer Sister     Social History Social History   Tobacco Use  . Smoking status: Former Smoker    Packs/day: 1.00    Types: Cigarettes    Last attempt to quit: 04/02/2018    Years  since quitting: 0.2  . Smokeless tobacco: Former Neurosurgeon  . Tobacco comment: approx 1ppd - maybe a little less (04/18/15)  Substance Use Topics  . Alcohol use: No    Alcohol/week: 0.0 standard drinks  . Drug use: No    Current Outpatient Medications  Medication Sig Dispense Refill  . amiodarone (PACERONE) 200 MG tablet TAKE 1/2 TABLET(100 MG) BY MOUTH DAILY 45 tablet 0  . Ascorbic Acid (VITAMIN C) 1000 MG tablet Take 1,000 mg by mouth daily.    [name] aspirin EC 81 MG EC tablet Take 1 tablet (81 mg total) by mouth daily.    [name] atorvastatin (LIPITOR) 40 MG tablet Take 1 tablet (40 mg total) by mouth daily at 6 PM. 30 tablet 3  . clonazePAM (KLONOPIN) 1 MG tablet TAKE 1  TABLET(1 MG) BY MOUTH TWICE DAILY AS NEEDED FOR ANXIETY 60 tablet 5  . ELIQUIS 5 MG TABS tablet TAKE 1 TABLET BY MOUTH TWICE DAILY. SCHEDULE APPOINTMENT FOR FURTHER REFILLS 180 tablet 1  . fludrocortisone (FLORINEF) 0.1 MG tablet Take 1 tablet (0.1 mg total) by mouth daily. 30 tablet 2  . Garlic 1000 MG CAPS Take 1,000 mg by mouth daily.     . Hypromellose (ARTIFICIAL TEARS OP) Place 1 drop into both eyes 3 (three) times daily as needed (for dry eyes).     . Multiple Vitamin (MULTIVITAMIN WITH MINERALS) TABS tablet Take 1 tablet by mouth daily. One-A-Day Men's    . triamcinolone cream (KENALOG) 0.1 % Apply 1 application topically 2 (two) times daily. (Patient taking differently: Apply 1 application topically 2 (two) times daily as needed (for rough spots on ear). ) 30 g 0  . venlafaxine XR (EFFEXOR-XR) 150 MG 24 hr capsule TAKE 1 CAPSULE(150 MG) BY MOUTH DAILY WITH BREAKFAST 90 capsule 2   No current facility-administered medications for this visit.     Allergies  Allergen Reactions  . Sertraline Hcl Other (See Comments)    Extreme headaches  . Trazodone And Nefazodone Other (See Comments)    Irregular heart beat? Hypotension?    Review of Systems  Weight and appetite improved Strength and exercise tolerance improved No chest pain fever or shortness of breath  BP 108/68 (BP Location: Right Arm, Patient Position: Sitting, Cuff Size: Normal)   Pulse 77   Resp 18   Ht 6' (1.829 m)   Wt 210 lb 3.2 oz (95.3 kg)   SpO2 93% Comment: RA  BMI 28.51 kg/m  Physical Exam      Exam    General- alert and comfortable    Neck- no JVD, no cervical adenopathy palpable, no carotid bruit   Lungs- clear without rales, wheezes   Cor- regular rate and rhythm, no murmur , gallop   Abdomen- soft, non-tender   Extremities - warm, non-tender, minimal edema   Neuro- oriented, appropriate, no focal weakness   Diagnostic Tests: None  Impression: Excellent recovery after multivessel CABG Since it  has been 3 months he can go back to normal activities without restrictions.  Hopefully his amiodarone can be discontinued at his next visit in the Eliquis removed later.  Plan: Importance of heart healthy diet, heart healthy style patient.  He will return as needed.   Jeremy Bussing, MD [facility] [phone]

## 2018-07-16 ENCOUNTER — Ambulatory Visit: Payer: Medicare Other | Admitting: Internal Medicine

## 2018-07-16 ENCOUNTER — Encounter: Payer: Self-pay | Admitting: Internal Medicine

## 2018-07-16 VITALS — BP 109/68 | HR 68 | Ht 72.0 in | Wt 210.4 lb

## 2018-07-16 DIAGNOSIS — I480 Paroxysmal atrial fibrillation: Secondary | ICD-10-CM | POA: Diagnosis not present

## 2018-07-16 DIAGNOSIS — I5022 Chronic systolic (congestive) heart failure: Secondary | ICD-10-CM

## 2018-07-16 DIAGNOSIS — Z951 Presence of aortocoronary bypass graft: Secondary | ICD-10-CM

## 2018-07-16 MED ORDER — ATORVASTATIN CALCIUM 40 MG PO TABS: 40.0000 mg | ORAL_TABLET | Freq: Every day | ORAL | 3 refills | Status: DC

## 2018-07-16 NOTE — Patient Instructions (Signed)
Medication Instructions:  STOP amdiodaone If you need a refill on your cardiac medications before your next appointment, please call your pharmacy.   Follow-Up: At [facility], you and your health needs are our priority.  As part of our continuing mission to provide you with exceptional heart care, we have created designated Provider Care Teams.  These Care Teams include your primary Cardiologist (physician) and Advanced Practice Providers (APPs -  Physician Assistants and Nurse Practitioners) who all work together to provide you with the care you need, when you need it. You will need a follow up appointment in 6 months.  Please call our office 2 months in advance to schedule this appointment.  You may see Dr. Rennis Golden or one of the following Advanced Practice Providers on your designated Care Team: Azalee Course, [place] . Micah Flesher, PA-C  Any Other Special Instructions Will Be Listed Below (If Applicable).

## 2018-07-19 ENCOUNTER — Encounter: Payer: Self-pay | Admitting: Internal Medicine

## 2018-07-19 NOTE — Progress Notes (Signed)
OFFICE NOTE  Chief Complaint:  Hospital follow-up  Primary Care Physician: [name], MD  HPI:  Jeremy King is a pleasant 67 year old male with few medical problems. He has a history of anxiety as well as dyslipidemia. Unfortunately he's had a number of syncopal episodes and has known orthostatic hypotension. He tells me that couple years ago he was seen at the [facility] and syncopized. Blood pressure was noted to be 60 over palpation. He was hospitalized for this and treated but is had recurrent hypotension. He was previously on Flomax which was discontinued. He does have a history of kidney stones with what sounds like ureteral stenting and/or lithotripsy. He probably saw a cardiologist once at the [place] and he tells me that they did not recommend any different medications. His CHADSVASC score is 1 given his age of 54 and hypertension. He has been maintained on aspirin. Recently he was seen in [facility] after a syncopal episode. He underwent cardiac workup including echocardiogram which shows normal LV function. He was noted to be in A. fib at that time. He says that it comes and goes but is not clear whether it has become more persistent. He's never had a cardioversion before. He denies significant alcohol use but has been a long-term smoker. There also is a strong family history of coronary disease in both parents. Recently he's been having problems with his knee and is contemplating left knee surgery with Dr. Ciro Backer.  It should be noted in the office today that he became dizzy after sitting up for his EKG. Orthostatic blood pressures were taken and he had a 20 point drop in systolic blood pressure with change in position.  I saw Jeremy King back in the office today. Unfortunately his home monitor has been alarming as he's had frequent atrial fibrillation with rapid ventricular response. Heart rates have been up into the 180s. He also had a near-syncopal episode. He  unfortunately did not check his blood pressure at that time. He's been pretty much in persistent A. fib since we placed the monitor. I think this is at least contributing to his symptoms. 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 Exam is not consistent with heart failure. Blood pressure is slightly improved today at 100 systolic, however it is limited any AV nodal blocking medications. He's currently only on aspirin because of a low CHADSVASC score.  Jeremy King returns today for follow-up. He successfully cardioverted and is maintaining sinus bradycardia. He's worn a Holter monitor which demonstrates several episodes of significant bradycardia into the 30s. He's had some sinus pauses of 2-1/2-3 seconds, which appear mostly at night. Laboratory work is unremarkable and digoxin level is 1.7. The digoxin, again was started for heart rate control prior to cardioversion as his blood pressure was less than 90 systolic with significant orthostasis. I also discontinued his trazodone which seems to have significantly improved his orthostasis. Blood pressure remains low today at 98/50. He underwent a nuclear stress test which showed 2 small fixed defects which were thought to be either small areas of scar or artifact. His echo showed normal wall motion and preserved systolic function. He is not describing anginal symptoms.  Jeremy King returns today for follow-up. He has since seen Dr. Johney Frame who put him on multiecho for control of his atrial fibrillation. Overall he feels that he may be doing well with this although yesterday he thought his heart rate was irregular. From what he described it sounds like  it was regularly irregular, this may not be atrial fibrillation. He seems to be tolerating this and the Eliquis without any bleeding problems. He is come off of trazodone due to problems with orthostatic hypotension and syncope. He's not had any further episodes. He is having problems sleeping at night  though. He was on melatonin which was initially helpful but however since it has not been helpful. In the past we talked about the possibility of obstructive sleep apnea or other sleep related disorder. He was hesitant to get a sleep study but now seems to be interested in that.  09/19/2016  Jeremy King returns for follow-up. Overall he is doing well without recurrent atrial fibrillation. He is maintained on multiecho. She is EKG shows sinus rhythm with a PAC an incomplete right bundle branch block. QTC is 437 ms. He is on Eliquis and denies any significant bleeding problems. He uses melatonin for sleep at night and takes 15 mg. Is generally helps him get 5-6 hours of sleep. He denies any chest pain or worsening shortness of breath.  02/05/2018  Jeremy King was seen today in follow-up.  He says he is feeling well but unfortunately had a recent fall.  He said he was building a ramp for his kayak and somehow tripped and landed on the edge of the ramp.  He has a large area of ecchymosis over the right cheek which extends down into the right anterior neck and some suborbital ecchymosis.  He denies any pain or tenderness concerning for fracture.  He had no mental status changes or loss of consciousness.  He had no significant external bleeding.  He continues to take his Eliquis.  He says is been compliant with it and has not missed doses.  Of note, he is back in atrial fibrillation today.  He was very surprised to hear this since he is feeling well.  In the past he has been very symptomatic and has felt dizzy and passed out.  It may be because his rate is better controlled on [place] around.  This is the first episode of A. fib in the past several years.  02/18/2018  Jeremy King returns today for follow-up.  He underwent cardioversion on Monday and successfully converted back to sinus rhythm after single shock.  He says he actually felt a lot better with some more energy however yesterday felt somewhat fatigued.   He checked his blood pressure and his machine noted that he was out of rhythm.  He also felt somewhat fatigued.  Today's EKG shows she is back in A. fib at 74.  This represents breakthrough of his Multitak.  We discussed options for treatment today including washing out his Multitak and switching to an alternate antiarrhythmic medication or referral for A. fib ablation.  He would like to try medical therapy.  I mention if we were to start a 1C agent, he would need some ischemia evaluation since his last stress test was in 2016.  03/09/2018  Jeremy King returns for follow-up of stress test.  He underwent a nuclear stress test on 02/27/2018.  This showed a mildly reduced LVEF at 49%.  There was mild septal and inferior wall ischemia from the apex to base.  This was suggested as low risk however abnormal.  He denies any chest pain or worsening shortness of breath.  He does report some fatigue however this may be related to A. fib.  He noted to be have slightly more energy after converting back to sinus rhythm  for about a day and then went back into A. fib.  This is why proposed antiarrhythmic therapy.  Previously had been on Multaq for years and then broke through with recurrent AF.  The plan was to switch him to a different antiarrhythmic such as flecainide, but we needed to make sure that he had no coronary disease prior to that.  I discussed the stress test with him including the fact that it may represent a false positive.  Nonetheless a definitive coronary evaluation is warranted.  It could be also that if he has underlying coronary disease, that may be why he did not remain in normal rhythm after a successful cardioversion.  07/16/2018  Jeremy King returns for hospital follow-up.  Overall he is doing well.  He underwent four-vessel CABG as well as maze, clipping of the left atrial appendage and is back in sinus rhythm now on amiodarone.  He is also on Eliquis for anticoagulation.  He reports no significant  symptoms and said he had no real pain with the procedure.  Per Dr. Maren Beach, we are considering taking him off of amiodarone and eventually discontinuing his Eliquis.  PMHx:  Past Medical History:  Diagnosis Date  . Anxiety   . Back ache   . Coronary artery disease   . History of kidney stones   . Hypotension   . Kidney stones   . Paroxysmal atrial fibrillation (HCC)    chads2vasc score is at least 1  . Syncope    due to hypotensin/ complicated by RVR with afib    Past Surgical History:  Procedure Laterality Date  . CARDIAC CATHETERIZATION    . CARDIOVERSION N/A 12/30/2014   Procedure: CARDIOVERSION;  Surgeon: Chrystie Nose, MD;  Location: [facility] ENDOSCOPY;  Service: Cardiovascular;  Laterality: N/A;  . CARDIOVERSION N/A 02/16/2018   Procedure: CARDIOVERSION;  Surgeon: Chrystie Nose, MD;  Location: [facility] ENDOSCOPY;  Service: Cardiovascular;  Laterality: N/A;  . CLIPPING OF ATRIAL APPENDAGE Left 04/06/2018   Procedure: CLIPPING OF ATRIAL APPENDAGE USING ATRICURE ATRICLIP ACHV40;  Surgeon: Kerin Perna, MD;  Location: [facility] OR;  Service: Open Heart Surgery;  Laterality: Left;  . CORONARY ARTERY BYPASS GRAFT N/A 04/06/2018   Procedure: CORONARY ARTERY BYPASS GRAFTING (CABG) x 4 WITH ENDOSCOPIC HARVESTING OF RIGHT SAPHENOUS VEIN;  Surgeon: Kerin Perna, MD;  Location: [facility] OR;  Service: Open Heart Surgery;  Laterality: N/A;  . LEFT HEART CATH AND CORONARY ANGIOGRAPHY N/A 03/12/2018   Procedure: LEFT HEART CATH AND CORONARY ANGIOGRAPHY;  Surgeon: [name], MD;  Location: [facility] INVASIVE CV LAB;  Service: Cardiovascular;  Laterality: N/A;  . LITHOTRIPSY    . MAZE N/A 04/06/2018   Procedure: MAZE;  Surgeon: Kerin Perna, MD;  Location: [facility] OR;  Service: Open Heart Surgery;  Laterality: N/A;  . RIGHT ROTATOR CUFF    . TEE WITHOUT CARDIOVERSION N/A 12/30/2014   Procedure: TRANSESOPHAGEAL ECHOCARDIOGRAM (TEE);  Surgeon: Chrystie Nose, MD;  Location: [facility] ENDOSCOPY;  Service: Cardiovascular;   Laterality: N/A;  . TEE WITHOUT CARDIOVERSION N/A 04/06/2018   Procedure: TRANSESOPHAGEAL ECHOCARDIOGRAM (TEE);  Surgeon: Donata Clay, Theron Arista, MD;  Location: [facility] OR;  Service: Open Heart Surgery;  Laterality: N/A;    FAMHx:  Family History  Problem Relation Age of Onset  . Heart failure Mother        mitral valve disease  . Diabetes Father   . Heart failure Father        MI  . Hypertension Father   .  CAD Father   . Alcoholism Father   . Cancer Sister     SOCHx:   reports that he quit smoking about 3 months ago. His smoking use included cigarettes. He smoked 1.00 pack per day. He has quit using smokeless tobacco. He reports that he does not drink alcohol or use drugs.  ALLERGIES:  Allergies  Allergen Reactions  . Sertraline Hcl Other (See Comments)    Extreme headaches  . Trazodone And Nefazodone Other (See Comments)    Irregular heart beat? Hypotension?    ROS: Pertinent items noted in HPI and remainder of comprehensive ROS otherwise negative.  HOME MEDS: Current Outpatient Medications  Medication Sig Dispense Refill  . Ascorbic Acid (VITAMIN C) 1000 MG tablet Take 1,000 mg by mouth daily.    [name] aspirin EC 81 MG EC tablet Take 1 tablet (81 mg total) by mouth daily.    [name] atorvastatin (LIPITOR) 40 MG tablet Take 1 tablet (40 mg total) by mouth daily at 6 PM. 90 tablet 3  . clonazePAM (KLONOPIN) 1 MG tablet TAKE 1 TABLET(1 MG) BY MOUTH TWICE DAILY AS NEEDED FOR ANXIETY 60 tablet 5  . ELIQUIS 5 MG TABS tablet TAKE 1 TABLET BY MOUTH TWICE DAILY. SCHEDULE APPOINTMENT FOR FURTHER REFILLS 180 tablet 1  . fludrocortisone (FLORINEF) 0.1 MG tablet Take 1 tablet (0.1 mg total) by mouth daily. 30 tablet 2  . Garlic 1000 MG CAPS Take 1,000 mg by mouth daily.     . Hypromellose (ARTIFICIAL TEARS OP) Place 1 drop into both eyes 3 (three) times daily as needed (for dry eyes).     . Multiple Vitamin (MULTIVITAMIN WITH MINERALS) TABS tablet Take 1 tablet by mouth daily. One-A-Day Men's    .  triamcinolone cream (KENALOG) 0.1 % Apply 1 application topically 2 (two) times daily. (Patient taking differently: Apply 1 application topically 2 (two) times daily as needed (for rough spots on ear). ) 30 g 0  . venlafaxine XR (EFFEXOR-XR) 150 MG 24 hr capsule TAKE 1 CAPSULE(150 MG) BY MOUTH DAILY WITH BREAKFAST 90 capsule 2   No current facility-administered medications for this visit.     LABS/IMAGING: No results found for this or any previous visit (from the past 48 hour(s)). No results found.  VITALS: BP 109/68   Pulse 68   Ht 6' (1.829 m)   Wt 210 lb 6.4 oz (95.4 kg)   SpO2 94%   BMI 28.54 kg/m   EXAM: General appearance: alert and no distress Neck: no carotid bruit, no JVD and thyroid not enlarged, symmetric, no tenderness/mass/nodules Lungs: clear to auscultation bilaterally and Well-healed midline scar Heart: regular rate and rhythm, S1, S2 normal, no murmur, click, rub or gallop Abdomen: soft, non-tender; bowel sounds normal; no masses,  no organomegaly Extremities: extremities normal, atraumatic, no cyanosis or edema Pulses: 2+ and symmetric Skin: Skin color, texture, turgor normal. No rashes or lesions   EKG: Deferred  ASSESSMENT: 1. Coronary artery disease status post four-vessel CABG (LIMA to LAD, SVG to PDA, SVG to ramus, SVG to diagonal)-04/06/2018, modified maze procedure and clipping of the left atrial appendage with 40 mm atrial cure clip 2. Abnormal Myoview stress test-LVEF 49%, mild inferior ischemia (02/2018) 3. Recurrent atrial fibrillation -  on Multaq with recent cardioversion (CHADSVASC 2) 4. RBBB 5. Normal LV function by recent echocardiogram 6. Remote syncopal episodes 7. History of bradycardia with pauses 8. Fatigue, non-restorative sleep  PLAN: 1.   Jeremy King underwent successful coronary artery bypass grafting x4 in  July along with Maze procedure and clipping of the left atrial appendage with a 40 mm atrial cure clip.  Subsequently he had A.  fib but has returned to sinus rhythm.  He remains on Eliquis and low-dose amiodarone.  I advised him to discontinue the amiodarone today.  We will continue the Eliquis another 3 months and hopefully be able to discontinue it at that time.  Chrystie Nose, MD, [facility], FACP  [place]  [facility]  Medical Director of the [facility] Lipid [facility] &  Cardiovascular Risk Reduction Clinic Diplomate of the American Board of Clinical Lipidology Attending Cardiologist  Direct Dial: [phone]  Fax: [phone]  Website:  [name] Jeremy King 07/19/2018, 4:02 PM

## 2018-08-17 ENCOUNTER — Other Ambulatory Visit: Payer: Self-pay | Admitting: Physician Assistant

## 2018-08-20 ENCOUNTER — Other Ambulatory Visit: Payer: Self-pay | Admitting: Cardiology

## 2018-09-18 ENCOUNTER — Other Ambulatory Visit: Payer: Self-pay | Admitting: Cardiology

## 2018-09-22 ENCOUNTER — Other Ambulatory Visit: Payer: Self-pay | Admitting: Internal Medicine

## 2018-09-22 MED ORDER — FLUDROCORTISONE ACETATE 0.1 MG PO TABS: ORAL_TABLET | ORAL | 9 refills | Status: DC

## 2018-09-22 NOTE — Telephone Encounter (Signed)
Rx(s) sent to pharmacy electronically.  

## 2018-10-28 ENCOUNTER — Other Ambulatory Visit: Payer: Self-pay | Admitting: Internal Medicine

## 2018-11-29 ENCOUNTER — Other Ambulatory Visit: Payer: Self-pay | Admitting: Internal Medicine

## 2018-12-08 ENCOUNTER — Encounter: Payer: Self-pay | Admitting: Internal Medicine

## 2018-12-08 ENCOUNTER — Ambulatory Visit (INDEPENDENT_AMBULATORY_CARE_PROVIDER_SITE_OTHER): Payer: Medicare Other | Admitting: Internal Medicine

## 2018-12-08 ENCOUNTER — Other Ambulatory Visit (INDEPENDENT_AMBULATORY_CARE_PROVIDER_SITE_OTHER): Payer: Medicare Other

## 2018-12-08 VITALS — BP 110/80 | HR 61 | Temp 98.1°F | Ht 72.0 in | Wt 211.0 lb

## 2018-12-08 DIAGNOSIS — Z0000 Encounter for general adult medical examination without abnormal findings: Secondary | ICD-10-CM

## 2018-12-08 DIAGNOSIS — E7849 Other hyperlipidemia: Secondary | ICD-10-CM

## 2018-12-08 DIAGNOSIS — F411 Generalized anxiety disorder: Secondary | ICD-10-CM | POA: Diagnosis not present

## 2018-12-08 LAB — LIPID PANEL
Cholesterol: 124 mg/dL (ref 0–200)
HDL: 50.2 mg/dL (ref >39.00)
LDL Cholesterol: 57 mg/dL (ref 0–99)
NonHDL: 73.59
TRIGLYCERIDES: 84 mg/dL (ref 0.0–149.0)
Total CHOL/HDL Ratio: 2
VLDL: 16.8 mg/dL (ref 0.0–40.0)

## 2018-12-08 LAB — COMPREHENSIVE METABOLIC PANEL
ALT: 27 U/L (ref 0–53)
AST: 23 U/L (ref 0–37)
Albumin: 4.3 g/dL (ref 3.5–5.2)
Alkaline Phosphatase: 90 U/L (ref 39–117)
BUN: 12 mg/dL (ref 6–23)
CALCIUM: 9.1 mg/dL (ref 8.4–10.5)
CO2: 27 mEq/L (ref 19–32)
Chloride: 105 mEq/L (ref 96–112)
Creatinine, Ser: 0.97 mg/dL (ref 0.40–1.50)
GFR: 76.62 mL/min (ref >60.00)
Glucose, Bld: 108 mg/dL — ABNORMAL HIGH (ref 70–99)
Potassium: 4 mEq/L (ref 3.5–5.1)
Sodium: 139 mEq/L (ref 135–145)
Total Bilirubin: 0.9 mg/dL (ref 0.2–1.2)
Total Protein: 6.5 g/dL (ref 6.0–8.3)

## 2018-12-08 LAB — CBC
HCT: 45.2 % (ref 39.0–52.0)
HEMOGLOBIN: 15.4 g/dL (ref 13.0–17.0)
MCHC: 34.1 g/dL (ref 30.0–36.0)
MCV: 97.1 fl (ref 78.0–100.0)
Platelets: 189 10*3/uL (ref 150.0–400.0)
RBC: 4.66 Mil/uL (ref 4.22–5.81)
RDW: 13.5 % (ref 11.5–15.5)
WBC: 7.3 10*3/uL (ref 4.0–10.5)

## 2018-12-08 MED ORDER — TRAZODONE HCL 50 MG PO TABS: 25.0000 mg | ORAL_TABLET | Freq: Every evening | ORAL | 3 refills | Status: DC | PRN

## 2018-12-08 MED ORDER — VENLAFAXINE HCL ER 150 MG PO CP24: 150.0000 mg | ORAL_CAPSULE | Freq: Every day | ORAL | 3 refills | Status: DC

## 2018-12-08 NOTE — Assessment & Plan Note (Signed)
Flu shot up to date. [facility]. Shingrix counseled. Tetanus up to date. Colonoscopy counseled. Counseled about sun safety and mole surveillance. Counseled about the dangers of distracted driving. Given 10 year screening recommendations.

## 2018-12-08 NOTE — Patient Instructions (Addendum)
Health Maintenance After Age 70 After age 70, you are at a higher risk for certain long-term diseases and infections as well as injuries from falls. Falls are a major cause of broken bones and head injuries in people who are older than age 70. Getting regular preventive care can help to keep you healthy and well. Preventive care includes getting regular testing and making lifestyle changes as recommended by your health care provider. Talk with your health care provider about:  Which screenings and tests you should have. A screening is a test that checks for a disease when you have no symptoms.  A diet and exercise plan that is right for you. What should I know about screenings and tests to prevent falls? Screening and testing are the best ways to find a health problem early. Early diagnosis and treatment give you the best chance of managing medical conditions that are common after age 70. Certain conditions and lifestyle choices may make you more likely to have a fall. Your health care provider may recommend:  Regular vision checks. Poor vision and conditions such as cataracts can make you more likely to have a fall. If you wear glasses, make sure to get your prescription updated if your vision changes.  Medicine review. Work with your health care provider to regularly review all of the medicines you are taking, including over-the-counter medicines. Ask your health care provider about any side effects that may make you more likely to have a fall. Tell your health care provider if any medicines that you take make you feel dizzy or sleepy.  Osteoporosis screening. Osteoporosis is a condition that causes the bones to get weaker. This can make the bones weak and cause them to break more easily.  Blood pressure screening. Blood pressure changes and medicines to control blood pressure can make you feel dizzy.  Strength and balance checks. Your health care provider may recommend certain tests to check your  strength and balance while standing, walking, or changing positions.  Foot health exam. Foot pain and numbness, as well as not wearing proper footwear, can make you more likely to have a fall.  Depression screening. You may be more likely to have a fall if you have a fear of falling, feel emotionally low, or feel unable to do activities that you used to do.  Alcohol use screening. Using too much alcohol can affect your balance and may make you more likely to have a fall. What actions can I take to lower my risk of falls? General instructions  Talk with your health care provider about your risks for falling. Tell your health care provider if: ? You fall. Be sure to tell your health care provider about all falls, even ones that seem minor. ? You feel dizzy, sleepy, or off-balance.  Take over-the-counter and prescription medicines only as told by your health care provider. These include any supplements.  Eat a healthy diet and maintain a healthy weight. A healthy diet includes low-fat dairy products, low-fat (lean) meats, and fiber from whole grains, beans, and lots of fruits and vegetables. Home safety  Remove any tripping hazards, such as rugs, cords, and clutter.  Install safety equipment such as grab bars in bathrooms and safety rails on stairs.  Keep rooms and walkways well-lit. Activity   Follow a regular exercise program to stay fit. This will help you maintain your balance. Ask your health care provider what types of exercise are appropriate for you.  If you need a cane or   walker, use it as recommended by your health care provider.  Wear supportive shoes that have nonskid soles. Lifestyle  Do not drink alcohol if your health care provider tells you not to drink.  If you drink alcohol, limit how much you have: ? 0-1 drink a day for women. ? 0-2 drinks a day for men.  Be aware of how much alcohol is in your drink. In the [place] one drink equals one typical bottle of beer (12  oz), one-half glass of wine (5 oz), or one shot of hard liquor (1 oz).  Do not use any products that contain nicotine or tobacco, such as cigarettes and e-cigarettes. If you need help quitting, ask your health care provider. Summary  Having a healthy lifestyle and getting preventive care can help to protect your health and wellness after age 70.  Screening and testing are the best way to find a health problem early and help you avoid having a fall. Early diagnosis and treatment give you the best chance for managing medical conditions that are more common for people who are older than age 70.  Falls are a major cause of broken bones and head injuries in people who are older than age 70. Take precautions to prevent a fall at home.  Work with your health care provider to learn what changes you can make to improve your health and wellness and to prevent falls. This information is not intended to replace advice given to you by your health care provider. Make sure you discuss any questions you have with your health care provider. Document Released: 08/06/2017 Document Revised: 08/06/2017 Document Reviewed: 08/06/2017 Elsevier Interactive Patient Education  2019 Elsevier Inc.  

## 2018-12-08 NOTE — Assessment & Plan Note (Signed)
Review of [place] narcotic database with appropriate (rx tramadol after CABG only irregularity) fills. Can refill clonazepam 1 mg BID as needed. Reminded about risk of memory changes and falls.

## 2018-12-08 NOTE — Assessment & Plan Note (Signed)
Checking lipid panel and adjust lipitor 40 mg daily as needed. 

## 2018-12-08 NOTE — Progress Notes (Signed)
   Subjective:   Patient ID: Jeremy King, male    DOB: Sep 09, 1951, 68 y.o.   MRN: [id number]  HPI Here for medicare wellness and physical, no new complaints. Please see A/P for status and treatment of chronic medical problems.   Diet: heart healthy Physical activity: sedentary Depression/mood screen: negative, sleeping poorly Hearing: intact to whispered voice Visual acuity: grossly normal with lens, performs annual eye exam  ADLs: capable Fall risk: none Home safety: good Cognitive evaluation: intact to orientation, naming, recall and repetition EOL planning: adv directives discussed    Office Visit from 12/08/2018 in [facility]  PHQ-2 Total Score  0      I have personally reviewed and have noted 1. The patient's medical and social history - reviewed today no changes 2. Their use of alcohol, tobacco or illicit drugs 3. Their current medications and supplements 4. The patient's functional ability including ADL's, fall risks, home safety risks and hearing or visual impairment. 5. Diet and physical activities 6. Evidence for depression or mood disorders 7. Care team reviewed and updated (available in snapshot)  Review of Systems  Constitutional: Negative.   HENT: Negative.   Eyes: Negative.   Respiratory: Negative for cough, chest tightness and shortness of breath.   Cardiovascular: Negative for chest pain, palpitations and leg swelling.  Gastrointestinal: Negative for abdominal distention, abdominal pain, constipation, diarrhea, nausea and vomiting.  Musculoskeletal: Negative.   Skin: Negative.   Neurological: Negative.   Psychiatric/Behavioral: Negative.     Objective:  Physical Exam Constitutional:      Appearance: He is well-developed.  HENT:     Head: Normocephalic and atraumatic.  Neck:     Musculoskeletal: Normal range of motion.  Cardiovascular:     Rate and Rhythm: Normal rate and regular rhythm.  Pulmonary:     Effort:  Pulmonary effort is normal. No respiratory distress.     Breath sounds: Normal breath sounds. No wheezing or rales.  Abdominal:     General: Bowel sounds are normal. There is no distension.     Palpations: Abdomen is soft.     Tenderness: There is no abdominal tenderness. There is no rebound.  Skin:    General: Skin is warm and dry.  Neurological:     Mental Status: He is alert and oriented to person, place, and time.     Coordination: Coordination normal.     Vitals:   12/08/18 1306  BP: 110/80  Pulse: 61  Temp: 98.1 F (36.7 C)  TempSrc: Oral  SpO2: 98%  Weight: 211 lb (95.7 kg)  Height: 6' (1.829 m)    Assessment & Plan:

## 2018-12-11 ENCOUNTER — Other Ambulatory Visit: Payer: Self-pay | Admitting: Internal Medicine

## 2018-12-11 NOTE — Telephone Encounter (Signed)
Control database checked last refill: 11/12/2018  LOV: 12/08/2018 cpe MOL:MBEM

## 2018-12-14 ENCOUNTER — Ambulatory Visit (INDEPENDENT_AMBULATORY_CARE_PROVIDER_SITE_OTHER): Payer: Medicare Other | Admitting: Internal Medicine

## 2018-12-14 ENCOUNTER — Ambulatory Visit (INDEPENDENT_AMBULATORY_CARE_PROVIDER_SITE_OTHER)
Admission: RE | Admit: 2018-12-14 | Discharge: 2018-12-14 | Disposition: A | Discharge status: Home / Self Care | Payer: Medicare Other | Source: Ambulatory Visit | Attending: Internal Medicine | Admitting: Internal Medicine

## 2018-12-14 ENCOUNTER — Encounter: Payer: Self-pay | Admitting: Internal Medicine

## 2018-12-14 ENCOUNTER — Ambulatory Visit: Payer: Self-pay | Admitting: *Deleted

## 2018-12-14 VITALS — BP 110/62 | HR 69 | Temp 97.7°F | Ht 72.0 in | Wt 210.0 lb

## 2018-12-14 DIAGNOSIS — G4700 Insomnia, unspecified: Secondary | ICD-10-CM | POA: Insufficient documentation

## 2018-12-14 DIAGNOSIS — R042 Hemoptysis: Secondary | ICD-10-CM | POA: Insufficient documentation

## 2018-12-14 DIAGNOSIS — F5101 Primary insomnia: Secondary | ICD-10-CM

## 2018-12-14 DIAGNOSIS — Z720 Tobacco use: Secondary | ICD-10-CM | POA: Diagnosis not present

## 2018-12-14 MED ORDER — TRAZODONE HCL 100 MG PO TABS: 200.0000 mg | ORAL_TABLET | Freq: Every day | ORAL | 3 refills | Status: DC

## 2018-12-14 NOTE — Telephone Encounter (Signed)
FYI patient has appointment this afternoon

## 2018-12-14 NOTE — Patient Instructions (Addendum)
We have sent in the trazodone so that it is the right dose for when you run out.  We will check the chest x-ray today for the blood.   Let us know if this continues or increases on mychart or call us to let us know.   If you get dizziness or lightheaded seek care immediately.

## 2018-12-14 NOTE — Assessment & Plan Note (Signed)
Counseled to quit smoking which he does not feel able to do at this time.

## 2018-12-14 NOTE — Telephone Encounter (Signed)
Please advise 

## 2018-12-14 NOTE — Assessment & Plan Note (Signed)
Rx for trazodone 100 mg to take 200 mg total nightly. Asked him in the future to not take prescription in excess of what is prescribed without consultation with [place].

## 2018-12-14 NOTE — Assessment & Plan Note (Signed)
Checking CXR. We talked about risk of bleeding due to eliquis which is most likely. He is at risk for malignancy given past and current smoking. If CXR normal and resolves will monitor. If persistent needs pulmonary referral for possible further imaging or bronchoscopy.

## 2018-12-14 NOTE — Progress Notes (Signed)
   Subjective:   Patient ID: Jeremy King, male    DOB: [birth year], 68 y.o.   MRN: [id number]  HPI The patient is a 68 YO man coming in for several concerns including insomnia (started back on trazodone and took 1 pill nightly then 2 then 3 then 4 and 4 pills at night time is helping him to get 6 hours of sleep, wanted to know if we can change sig so he does not run out early, denies side effects) and coughing up blood (started yesterday, is a current smoker, about 10 cigarettes per day, had quit around CAD episode but is smoking again for some time, had a large coughing fit this weekend, has mild cough chronic, denies coughing up blood before, takes eliquis and denies missing doses, denies fevers or chills or flu like symptoms, coughing up 1/2 teaspoon several times yesterday, denies SOB, denies weight change).   Review of Systems  Constitutional: Negative.   HENT: Negative.   Eyes: Negative.   Respiratory: Positive for cough. Negative for chest tightness and shortness of breath.        Hemoptysis  Cardiovascular: Negative for chest pain, palpitations and leg swelling.  Gastrointestinal: Negative for abdominal distention, abdominal pain, constipation, diarrhea, nausea and vomiting.  Musculoskeletal: Negative.   Skin: Negative.   Neurological: Negative.   Psychiatric/Behavioral: Negative.     Objective:  Physical Exam Constitutional:      Appearance: He is well-developed.  HENT:     Head: Normocephalic and atraumatic.  Neck:     Musculoskeletal: Normal range of motion.  Cardiovascular:     Rate and Rhythm: Normal rate and regular rhythm.  Pulmonary:     Effort: Pulmonary effort is normal. No respiratory distress.     Breath sounds: Normal breath sounds. No wheezing or rales.  Abdominal:     General: Bowel sounds are normal. There is no distension.     Palpations: Abdomen is soft.     Tenderness: There is no abdominal tenderness. There is no rebound.  Skin:    General: Skin is  warm and dry.  Neurological:     Mental Status: He is alert and oriented to person, place, and time.     Coordination: Coordination normal.     Vitals:   12/14/18 1009  BP: 110/62  Pulse: 69  Temp: 97.7 F (36.5 C)  TempSrc: Oral  SpO2: 96%  Weight: 210 lb (95.3 kg)  Height: 6' (1.829 m)    Assessment & Plan:

## 2018-12-14 NOTE — Telephone Encounter (Signed)
Pt called with coughing up blood that started yesterday. He states about a teaspoon at times and it almost a clot at times. Not other c/o dizziness, fever or chest pain. He states feels a little soreness in his chest, no pain that has been going on for about a month.  He is on eliquis. No history of lung diseases. Appointment scheduled for this morning. Advised to call back for any concerns. Pt voiced understanding.  Reason for Disposition . [1] Coughed up blood AND [2] > 1 tablespoon (15 ml) (Exception: blood-tinged sputum)  Answer Assessment - Initial Assessment Questions 1. ONSET: "When did you start coughing up blood?"     Noticed yesterday  2. SEVERITY: "How many times?" "How much blood?" (e.g., flecks, streaks, tablespoons, etc)     A teaspoon, varies about 15 times 3. COUGHING SPASMS: "Did the blood appear after a coughing spell?"       no 4. RESPIRATORY DISTRESS: "Describe your breathing."      normal 5. FEVER: "Do you have a fever?" If so, ask: "What is your temperature, how was it measured, and when did it start?"     no 6. SPUTUM: "Describe the color of your sputum" (clear, white, yellow, green), "Has there been any change recently?"     Brownish now 7. CARDIAC HISTORY: "Do you have any history of heart disease?" (e.g., heart attack, congestive heart failure)      Quad bypass 8. LUNG HISTORY: "Do you have any history of lung disease?"  (e.g., pulmonary embolus, asthma, emphysema)     no 9. PE RISK FACTORS: "Do you have a history of blood clots?" (or: recent major surgery, recent prolonged travel, bedridden)     no 10. OTHER SYMPTOMS: "Do you have any other symptoms?" (e.g., nosebleed, chest pain, abdominal pain, vomiting)       Feels a difference now just lightly sore for over a month now 11. PREGNANCY: "Is there any chance you are pregnant?" "When was your last menstrual period?"       n/a 12. TRAVEL: "Have you traveled out of the country in the last month?" (e.g., travel  history, exposures)       no  Protocols used: COUGHING UP BLOOD-A-AH

## 2018-12-18 ENCOUNTER — Ambulatory Visit: Payer: Self-pay

## 2018-12-18 DIAGNOSIS — Z720 Tobacco use: Secondary | ICD-10-CM

## 2018-12-18 DIAGNOSIS — R042 Hemoptysis: Secondary | ICD-10-CM

## 2018-12-18 NOTE — Telephone Encounter (Signed)
Recommend monitoring at home

## 2018-12-18 NOTE — Telephone Encounter (Signed)
ret'd call to pt.  Reported 5-6 episodes of coughng up "clear mucus with streaks of red blood", that started about 12:00 PM today.  Estimated he coughed up about 1/2 tbsp. each time.  Questioned if he had any pain with deep breath; pt. denied.  Stated "I can't explain it, but I have a funny feeling in my lungs."  Denied shortness of breath.  Denied chest pain.  Denied having any coughing spells or coughing spasms.  Stated he reported having some difficulty swallowing when he called earlier today.  Also stated "the nodes under my chin are swollen, with right > left.  Pt. Is on Eliquis.  Advised will send note to Dr. Sharlet Salina re: recurrence of streaks of blood in his sputum.  Care advice given per protocol.  Pt. Knows to go to ER with increased signs of bleeding, dizziness associated with increased bleeding, shortness of breath, or any worsening symptoms.  Pt. verb. Understanding     and agrees with plan.  Notified FC of the above; will send Triage note to provider.           Reason for Disposition . Taking Coumadin (warfarin) or other strong blood thinner, or known bleeding disorder (e.g., thrombocytopenia)  Answer Assessment - Initial Assessment Questions 1. ONSET: "When did you start coughing up blood?"     Coughing up mucus with streaks of blood 2. SEVERITY: "How many times?" "How much blood?" (e.g., flecks, streaks, tablespoons, etc)     Approx. 1/2 tbsp. Per time; 5-6 times today  3. COUGHING SPASMS: "Did the blood appear after a coughing spell?"       Denied coughing spells or spasm 4. RESPIRATORY DISTRESS: "Describe your breathing."      Denied  5. FEVER: "Do you have a fever?" If so, ask: "What is your temperature, how was it measured, and when did it start?"     Denied  6. SPUTUM: "Describe the color of your sputum" (clear, white, yellow, green), "Has there been any change recently?"     Clear with streaks of blood ; started at 12:00 PM 7. CARDIAC HISTORY: "Do you have any history of  heart disease?" (e.g., heart attack, congestive heart failure)      *No Answer* 8. LUNG HISTORY: "Do you have any history of lung disease?"  (e.g., pulmonary embolus, asthma, emphysema)     *No Answer* 9. PE RISK FACTORS: "Do you have a history of blood clots?" (or: recent major surgery, recent prolonged travel, bedridden)     *No Answer* 10. OTHER SYMPTOMS: "Do you have any other symptoms?" (e.g., nosebleed, chest pain, abdominal pain, vomiting)       Can't explain, but "has funny feeling in lungs" ; denied shortness of breath; denied chest pain; able to take deep breath without pain.   11. PREGNANCY: "Is there any chance you are pregnant?" "When was your last menstrual period?"       N/a  12. TRAVEL: "Have you traveled out of the country in the last month?" (e.g., travel history, exposures)      N/a  Protocols used: COUGHING UP BLOOD-A-AH

## 2018-12-18 NOTE — Telephone Encounter (Signed)
LVM with MD response  

## 2018-12-18 NOTE — Telephone Encounter (Signed)
Patient called in with c/o "swallowing discomfort." He says "I noticed last night when I was swallowing food and liquid, my throat had very mild pain at a 2. This morning I felt under my chin to both sides of my esophagus and noticed some swelling. The right is larger than the left, right is about 1 inch in circumference, the left is swollen, but not able to tell how large." I asked him to look in the mirror to see if the swelling is visible and to look in his throat to see if the tonsils are swollen. He says "I don't see the swelling on my neck and my tonsils are fine, no redness, no swelling that I could see." I asked was there an effort to swallow or just a normal swallow, he says "I swallowed normal, it was just that pain that concerned me. Especially since I was seen earlier this week for coughing up blood. I wonder if this is all related." I asked about fever, cold symptoms, any other symptoms, he denies all. I advised the patient that since he was already in the office this week and he's not experiencing any other symptoms, my advice is to send to Dr. Okey Dupre for her recommendations, he agrees. I advised someone from the office will call back with Dr. Frutoso Chase recommendation. I advised to monitor the lymph nodes for increased swelling.   Reason for Disposition . [1] Mildly swollen lymph node AND [2] has cold symptoms (runny nose, cough, sore throat)  Answer Assessment - Initial Assessment Questions 1. LOCATION: "Where is the swollen node located?" "Is the matching node on the other side of the body also swollen?"      Under the chin, right larger than the right only when felt, not able to see in the mirror 2. SIZE: "How big is the node?" (Inches or centimeters) (or compare to common objects such as pea, bean, marble, golf ball)      Right is about 1 inch circumference; left side is swollen, but unable to compare the size 3. ONSET: "When did the swelling start?"      This morning when the  swallowing was different 4. NECK NODES: "Is there a sore throat, runny nose or other symptoms of a cold?"      No 5. GROIN OR ARMPIT NODES: "Is there a sore, scratch, cut or painful red area on that arm or leg?"      No 6. FEVER: "Do you have a fever?" If so, ask: "What is it, how was it measured, and when did it start?"      No 7. CAUSE: "What do you think is causing the swollen lymph nodes?"     No idea 8. OTHER SYMPTOMS: "Do you have any other symptoms?"     No 9. PREGNANCY: "Is there any chance you are pregnant?" "When was your last menstrual period?"     N/A  Protocols used: LYMPH NODES SWOLLEN-A-AH

## 2018-12-21 NOTE — Telephone Encounter (Signed)
LVM informing patient of MD response  

## 2018-12-21 NOTE — Telephone Encounter (Signed)
Referral to pulmonary placed as we discussed we would do if this happened again.

## 2018-12-21 NOTE — Addendum Note (Signed)
Addended by: Hillard Danker A on: 12/21/2018 09:51 AM   Modules accepted: Orders

## 2019-01-05 ENCOUNTER — Encounter: Payer: Self-pay | Admitting: Internal Medicine

## 2019-01-05 MED ORDER — RAMELTEON 8 MG PO TABS: 8.0000 mg | ORAL_TABLET | Freq: Every day | ORAL | 3 refills | Status: DC

## 2019-01-19 ENCOUNTER — Telehealth: Payer: Self-pay | Admitting: Internal Medicine

## 2019-01-19 NOTE — Telephone Encounter (Signed)
LMTCB to change visit to telephone or video for appointment with Dr. Rennis Golden on 4/16. Asked pt if ok to reschedule to 10:30 AM that day. Informed that I will also send MyChart message with this information so he can either call back or respond via MyChart.

## 2019-01-20 ENCOUNTER — Other Ambulatory Visit: Payer: Self-pay | Admitting: Internal Medicine

## 2019-01-20 ENCOUNTER — Encounter: Payer: Self-pay | Admitting: Internal Medicine

## 2019-01-21 ENCOUNTER — Ambulatory Visit: Payer: Medicare Other | Admitting: Internal Medicine

## 2019-03-30 ENCOUNTER — Encounter: Payer: Self-pay | Admitting: Internal Medicine

## 2019-04-01 ENCOUNTER — Encounter: Payer: Self-pay | Admitting: Internal Medicine

## 2019-04-01 ENCOUNTER — Other Ambulatory Visit: Payer: Self-pay

## 2019-04-01 ENCOUNTER — Ambulatory Visit (INDEPENDENT_AMBULATORY_CARE_PROVIDER_SITE_OTHER)
Admission: RE | Admit: 2019-04-01 | Discharge: 2019-04-01 | Disposition: A | Discharge status: Home / Self Care | Payer: Medicare Other | Source: Ambulatory Visit | Attending: Internal Medicine | Admitting: Internal Medicine

## 2019-04-01 ENCOUNTER — Ambulatory Visit (INDEPENDENT_AMBULATORY_CARE_PROVIDER_SITE_OTHER): Payer: Medicare Other | Admitting: Internal Medicine

## 2019-04-01 VITALS — BP 116/74 | HR 73 | Temp 98.2°F | Ht 72.0 in | Wt 206.0 lb

## 2019-04-01 DIAGNOSIS — M545 Low back pain, unspecified: Secondary | ICD-10-CM | POA: Insufficient documentation

## 2019-04-01 DIAGNOSIS — M47816 Spondylosis without myelopathy or radiculopathy, lumbar region: Secondary | ICD-10-CM | POA: Diagnosis not present

## 2019-04-01 DIAGNOSIS — G8929 Other chronic pain: Secondary | ICD-10-CM | POA: Diagnosis not present

## 2019-04-01 DIAGNOSIS — I700 Atherosclerosis of aorta: Secondary | ICD-10-CM | POA: Diagnosis not present

## 2019-04-01 MED ORDER — LIDOCAINE 5 % EX PTCH: 1.0000 | MEDICATED_PATCH | CUTANEOUS | 0 refills | Status: DC

## 2019-04-01 NOTE — Patient Instructions (Signed)
We will check the x-ray of the back today.   I have sent in lidocaine patches to use on the back for pain.  It is safe to take tylenol (acetaminophen) for the pain. Do not take aleve or ibuprofen because of the eliquis that you are taking as this will put you at higher risk for GI bleeding.

## 2019-04-01 NOTE — Assessment & Plan Note (Signed)
Checking x-ray lumbar to rule out occult fracture and grade arthritis if present. Counseled strongly against NSAIDS due to concurrent eliquis and aspirin. Can use tylenol for pain and rx for lidoderm patches to help also.

## 2019-04-01 NOTE — Progress Notes (Signed)
   Subjective:   Patient ID: Jeremy King, male    DOB: 1951/08/19, 68 y.o.   MRN: [id number]  HPI The patient is a 68 YO man coming in for back pain. Started months ago at least but possibly as long as a year ago. He thought initially it was just arthritis. Then he recalled that it started after fall which caused him to go back into a fib last summer. It is overall stable but mild worsening. Overall it is stable. Denies numbness or weakness in legs. Denies radiation of the pain. Denies injury or overuse recently. Pain is 5/10. Taking tylenol for pain which works for a couple hours then back to hurting. Not sore to touch. Not worse with bending or twisting much. Has tried tylenol for pain with relief. He did not take any pain medication yet today as he wanted [place] to see his symptoms normally.   Review of Systems  Constitutional: Negative for activity change, appetite change, fatigue, fever and unexpected weight change.  Respiratory: Negative.   Cardiovascular: Negative.   Musculoskeletal: Positive for back pain and myalgias. Negative for arthralgias.  Skin: Negative.   Neurological: Negative for syncope, weakness and numbness.    Objective:  Physical Exam Constitutional:      Appearance: He is well-developed.  HENT:     Head: Normocephalic and atraumatic.  Neck:     Musculoskeletal: Normal range of motion.  Cardiovascular:     Rate and Rhythm: Normal rate and regular rhythm.  Pulmonary:     Effort: Pulmonary effort is normal. No respiratory distress.     Breath sounds: Normal breath sounds. No wheezing or rales.  Abdominal:     General: Bowel sounds are normal. There is no distension.     Palpations: Abdomen is soft.     Tenderness: There is no abdominal tenderness. There is no rebound.  Musculoskeletal:        General: No tenderness.     Comments: No tenderness on exam to the low spine or lumbar region, no pain on flexion or twisting during exam  Skin:    General: Skin is warm  and dry.  Neurological:     Mental Status: He is alert and oriented to person, place, and time.     Coordination: Coordination normal.     Vitals:   04/01/19 1031  BP: 116/74  Pulse: 73  Temp: 98.2 F (36.8 C)  TempSrc: Oral  SpO2: 94%  Weight: 206 lb (93.4 kg)  Height: 6' (1.829 m)    Assessment & Plan:

## 2019-04-07 ENCOUNTER — Telehealth: Payer: Self-pay

## 2019-04-07 NOTE — Telephone Encounter (Addendum)
PA approved through 10/07/2019.  

## 2019-04-07 NOTE — Telephone Encounter (Signed)
PA started on CoverMyMeds KEY: [id number]

## 2019-05-24 ENCOUNTER — Ambulatory Visit (INDEPENDENT_AMBULATORY_CARE_PROVIDER_SITE_OTHER): Payer: Medicare Other | Admitting: Internal Medicine

## 2019-05-24 ENCOUNTER — Encounter: Payer: Self-pay | Admitting: Internal Medicine

## 2019-05-24 ENCOUNTER — Other Ambulatory Visit: Payer: Self-pay

## 2019-05-24 VITALS — BP 103/70 | HR 86 | Ht 72.0 in | Wt 206.4 lb

## 2019-05-24 DIAGNOSIS — E782 Mixed hyperlipidemia: Secondary | ICD-10-CM

## 2019-05-24 DIAGNOSIS — I480 Paroxysmal atrial fibrillation: Secondary | ICD-10-CM | POA: Diagnosis not present

## 2019-05-24 DIAGNOSIS — I951 Orthostatic hypotension: Secondary | ICD-10-CM

## 2019-05-24 DIAGNOSIS — Z951 Presence of aortocoronary bypass graft: Secondary | ICD-10-CM

## 2019-05-24 NOTE — Patient Instructions (Signed)
Medication Instructions:  STOP Eliquis If you need a refill on your cardiac medications before your next appointment, please call your pharmacy.   Lab work: FASTING lab work in 1 year (prior to next visit with MD) - we will mail you the lab order  Follow-Up: At [facility], you and your health needs are our priority.  As part of our continuing mission to provide you with exceptional heart care, we have created designated Provider Care Teams.  These Care Teams include your primary Cardiologist (physician) and Advanced Practice Providers (APPs -  Physician Assistants and Nurse Practitioners) who all work together to provide you with the care you need, when you need it. You will need a follow up appointment in 12 months.  Please call our office 2 months in advance to schedule this appointment.  You may see Pixie Casino, MD or one of the following Advanced Practice Providers on your designated Care Team: [place], [place] . Fabian Sharp, PA-C  Any Other Special Instructions Will Be Listed Below (If Applicable).

## 2019-05-24 NOTE — Progress Notes (Signed)
OFFICE NOTE  Chief Complaint:  Routine follow-up  Primary Care Physician: [name], MD  HPI:  Jeremy King is a pleasant 68 year old male with few medical problems. He has a history of anxiety as well as dyslipidemia. Unfortunately he's had a number of syncopal episodes and has known orthostatic hypotension. He tells me that couple years ago he was seen at the [facility] and syncopized. Blood pressure was noted to be 60 over palpation. He was hospitalized for this and treated but is had recurrent hypotension. He was previously on Flomax which was discontinued. He does have a history of kidney stones with what sounds like ureteral stenting and/or lithotripsy. He probably saw a cardiologist once at the [place] and he tells me that they did not recommend any different medications. His CHADSVASC score is 1 given his age of 70 and hypertension. He has been maintained on aspirin. Recently he was seen in [facility] after a syncopal episode. He underwent cardiac workup including echocardiogram which shows normal LV function. He was noted to be in A. fib at that time. He says that it comes and goes but is not clear whether it has become more persistent. He's never had a cardioversion before. He denies significant alcohol use but has been a long-term smoker. There also is a strong family history of coronary disease in both parents. Recently he's been having problems with his knee and is contemplating left knee surgery with Dr. Ciro Backer.  It should be noted in the office today that he became dizzy after sitting up for his EKG. Orthostatic blood pressures were taken and he had a 20 point drop in systolic blood pressure with change in position.  I saw Mr. Alviar back in the office today. Unfortunately his home monitor has been alarming as he's had frequent atrial fibrillation with rapid ventricular response. Heart rates have been up into the 180s. He also had a near-syncopal episode. He  unfortunately did not check his blood pressure at that time. He's been pretty much in persistent A. fib since we placed the monitor. I think this is at least contributing to his symptoms. 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 Exam is not consistent with heart failure. Blood pressure is slightly improved today at 100 systolic, however it is limited any AV nodal blocking medications. He's currently only on aspirin because of a low CHADSVASC score.  Mr. Mustin returns today for follow-up. He successfully cardioverted and is maintaining sinus bradycardia. He's worn a Holter monitor which demonstrates several episodes of significant bradycardia into the 30s. He's had some sinus pauses of 2-1/2-3 seconds, which appear mostly at night. Laboratory work is unremarkable and digoxin level is 1.7. The digoxin, again was started for heart rate control prior to cardioversion as his blood pressure was less than 90 systolic with significant orthostasis. I also discontinued his trazodone which seems to have significantly improved his orthostasis. Blood pressure remains low today at 98/50. He underwent a nuclear stress test which showed 2 small fixed defects which were thought to be either small areas of scar or artifact. His echo showed normal wall motion and preserved systolic function. He is not describing anginal symptoms.  Mr. Mcelreath returns today for follow-up. He has since seen Dr. Johney Frame who put him on multiecho for control of his atrial fibrillation. Overall he feels that he may be doing well with this although yesterday he thought his heart rate was irregular. From what he described it sounds like  it was regularly irregular, this may not be atrial fibrillation. He seems to be tolerating this and the Eliquis without any bleeding problems. He is come off of trazodone due to problems with orthostatic hypotension and syncope. He's not had any further episodes. He is having problems sleeping at night  though. He was on melatonin which was initially helpful but however since it has not been helpful. In the past we talked about the possibility of obstructive sleep apnea or other sleep related disorder. He was hesitant to get a sleep study but now seems to be interested in that.  09/19/2016  [name] returns for follow-up. Overall he is doing well without recurrent atrial fibrillation. He is maintained on multiecho. She is EKG shows sinus rhythm with a PAC an incomplete right bundle branch block. QTC is 437 ms. He is on Eliquis and denies any significant bleeding problems. He uses melatonin for sleep at night and takes 15 mg. Is generally helps him get 5-6 hours of sleep. He denies any chest pain or worsening shortness of breath.  02/05/2018  [name] was seen today in follow-up.  He says he is feeling well but unfortunately had a recent fall.  He said he was building a ramp for his kayak and somehow tripped and landed on the edge of the ramp.  He has a large area of ecchymosis over the right cheek which extends down into the right anterior neck and some suborbital ecchymosis.  He denies any pain or tenderness concerning for fracture.  He had no mental status changes or loss of consciousness.  He had no significant external bleeding.  He continues to take his Eliquis.  He says is been compliant with it and has not missed doses.  Of note, he is back in atrial fibrillation today.  He was very surprised to hear this since he is feeling well.  In the past he has been very symptomatic and has felt dizzy and passed out.  It may be because his rate is better controlled on Frost[place]annette around.  This is the first episode of A. fib in the past several years.  02/18/2018  [name] returns today for follow-up.  He underwent cardioversion on Monday and successfully converted back to sinus rhythm after single shock.  He says he actually felt a lot better with some more energy however yesterday felt somewhat fatigued.   He checked his blood pressure and his machine noted that he was out of rhythm.  He also felt somewhat fatigued.  Today's EKG shows she is back in A. fib at 74.  This represents breakthrough of his Multitak.  We discussed options for treatment today including washing out his Multitak and switching to an alternate antiarrhythmic medication or referral for A. fib ablation.  He would like to try medical therapy.  I mention if we were to start a 1C agent, he would need some ischemia evaluation since his last stress test was in 2016.  03/09/2018  [name] returns for follow-up of stress test.  He underwent a nuclear stress test on 02/27/2018.  This showed a mildly reduced LVEF at 49%.  There was mild septal and inferior wall ischemia from the apex to base.  This was suggested as low risk however abnormal.  He denies any chest pain or worsening shortness of breath.  He does report some fatigue however this may be related to A. fib.  He noted to be have slightly more energy after converting back to sinus rhythm  for about a day and then went back into A. fib.  This is why proposed antiarrhythmic therapy.  Previously had been on Multaq for years and then broke through with recurrent AF.  The plan was to switch him to a different antiarrhythmic such as flecainide, but we needed to make sure that he had no coronary disease prior to that.  I discussed the stress test with him including the fact that it may represent a false positive.  Nonetheless a definitive coronary evaluation is warranted.  It could be also that if he has underlying coronary disease, that may be why he did not remain in normal rhythm after a successful cardioversion.  07/16/2018  Mr. Frisch returns for hospital follow-up.  Overall he is doing well.  He underwent four-vessel CABG as well as maze, clipping of the left atrial appendage and is back in sinus rhythm now on amiodarone.  He is also on Eliquis for anticoagulation.  He reports no significant  symptoms and said he had no real pain with the procedure.  Per Dr. Darcey Nora, we are considering taking him off of amiodarone and eventually discontinuing his Eliquis.  05/24/2019  Mr. Fults is seen today in follow-up.  He continues to do very well post surgery.  He is now more than 3 months after maze and left atrial appendage closure.  We previously discontinued amiodarone and discussed stopping his Eliquis.  He denies any chest pain or worsening shortness of breath.  He has been on low-dose fludrocortisone due to some hypotension.  Otherwise labs are well controlled.  His lipid profile showed LDL at goal less than 70.  EKG today shows sinus rhythm with a right bundle branch block at 86 and is unchanged.  He denies any recurrent A. Fib.  PMHx:  Past Medical History:  Diagnosis Date  . Anxiety   . Back ache   . Coronary artery disease   . History of kidney stones   . Hypotension   . Kidney stones   . Paroxysmal atrial fibrillation (HCC)    chads2vasc score is at least 1  . Syncope    due to hypotensin/ complicated by RVR with afib    Past Surgical History:  Procedure Laterality Date  . CARDIAC CATHETERIZATION    . CARDIOVERSION N/A 12/30/2014   Procedure: CARDIOVERSION;  Surgeon: Pixie Casino, MD;  Location: [facility] ENDOSCOPY;  Service: Cardiovascular;  Laterality: N/A;  . CARDIOVERSION N/A 02/16/2018   Procedure: CARDIOVERSION;  Surgeon: Pixie Casino, MD;  Location: [facility] ENDOSCOPY;  Service: Cardiovascular;  Laterality: N/A;  . CLIPPING OF ATRIAL APPENDAGE Left 04/06/2018   Procedure: CLIPPING OF ATRIAL APPENDAGE USING ATRICURE ATRICLIP ACHV40;  Surgeon: Ivin Poot, MD;  Location: [place];  Service: Open Heart Surgery;  Laterality: Left;  . CORONARY ARTERY BYPASS GRAFT N/A 04/06/2018   Procedure: CORONARY ARTERY BYPASS GRAFTING (CABG) x 4 WITH ENDOSCOPIC HARVESTING OF RIGHT SAPHENOUS VEIN;  Surgeon: Ivin Poot, MD;  Location: [place];  Service: Open Heart Surgery;  Laterality: N/A;   . LEFT HEART CATH AND CORONARY ANGIOGRAPHY N/A 03/12/2018   Procedure: LEFT HEART CATH AND CORONARY ANGIOGRAPHY;  Surgeon: Belva Crome, MD;  Location: [place] CV LAB;  Service: Cardiovascular;  Laterality: N/A;  . LITHOTRIPSY    . MAZE N/A 04/06/2018   Procedure: MAZE;  Surgeon: Ivin Poot, MD;  Location: [place];  Service: Open Heart Surgery;  Laterality: N/A;  . RIGHT ROTATOR CUFF    . TEE WITHOUT CARDIOVERSION  N/A 12/30/2014   Procedure: TRANSESOPHAGEAL ECHOCARDIOGRAM (TEE);  Surgeon: Chrystie NoseKenneth C Hilty, MD;  [facility];  Service: Cardiovascular;  Laterality: N/A;  . TEE WITHOUT CARDIOVERSION N/A 04/06/2018   Procedure: TRANSESOPHAGEAL ECHOCARDIOGRAM (TEE);  Surgeon: Donata ClayVan Trigt, Theron AristaPeter, MD;  [facility];  Service: Open Heart Surgery;  Laterality: N/A;    FAMHx:  Family History  Problem Relation Age of Onset  . Heart failure Mother        mitral valve disease  . Diabetes Father   . Heart failure Father        MI  . Hypertension Father   . CAD Father   . Alcoholism Father   . Cancer Sister     SOCHx:   reports that he has been smoking cigarettes. He has been smoking about 1.00 pack per day. He has quit using smokeless tobacco. He reports that he does not drink alcohol or use drugs.  ALLERGIES:  Allergies  Allergen Reactions  . Sertraline Hcl Other (See Comments)    Extreme headaches  . Trazodone And Nefazodone Other (See Comments)    Irregular heart beat? Hypotension?    ROS: Pertinent items noted in HPI and remainder of comprehensive ROS otherwise negative.  HOME MEDS: Current Outpatient Medications  Medication Sig Dispense Refill  . Ascorbic Acid (VITAMIN C) 1000 MG tablet Take 1,000 mg by mouth daily.    [name]. aspirin EC 81 MG EC tablet Take 1 tablet (81 mg total) by mouth daily.    [name]. atorvastatin (LIPITOR) 40 MG tablet Take 1 tablet (40 mg total) by mouth daily at 6 PM. 90 tablet 3  . clonazePAM (KLONOPIN) 1 MG tablet TAKE 1 TABLET(1 MG) BY MOUTH TWICE  DAILY AS NEEDED FOR ANXIETY 60 tablet 5  . ELIQUIS 5 MG TABS tablet TAKE 1 TABLET BY MOUTH TWICE DAILY. SCHEDULE APPOINTMENT FOR FURTHER REFILLS 180 tablet 1  . fludrocortisone (FLORINEF) 0.1 MG tablet TAKE 1 TABLET (0.1 MG) BY MOUTH DAILY 30 tablet 9  . Garlic 1000 MG CAPS Take 1,000 mg by mouth daily.     . Hypromellose (ARTIFICIAL TEARS OP) Place 1 drop into both eyes 3 (three) times daily as needed (for dry eyes).     . Multiple Vitamin (MULTIVITAMIN WITH MINERALS) TABS tablet Take 1 tablet by mouth daily. One-A-Day Men's    . ramelteon (ROZEREM) 8 MG tablet Take 1 tablet (8 mg total) by mouth at bedtime. 30 tablet 3  . traZODone (DESYREL) 100 MG tablet     . traZODone (DESYREL) 50 MG tablet     . triamcinolone cream (KENALOG) 0.1 % Apply 1 application topically 2 (two) times daily. (Patient taking differently: Apply 1 application topically 2 (two) times daily as needed (for rough spots on ear). ) 30 g 0  . venlafaxine XR (EFFEXOR-XR) 150 MG 24 hr capsule Take 1 capsule (150 mg total) by mouth daily with breakfast. Need annual appointment for further refills 90 capsule 3   No current facility-administered medications for this visit.     LABS/IMAGING: No results found for this or any previous visit (from the past 48 hour(s)). No results found.  VITALS: BP 103/70   Pulse 86   Ht 6' (1.829 m)   Wt 206 lb 6.4 oz (93.6 kg)   SpO2 93%   BMI 27.99 kg/m   EXAM: General appearance: alert and no distress Neck: no carotid bruit, no JVD and thyroid not enlarged, symmetric, no tenderness/mass/nodules Lungs: clear to auscultation bilaterally and Well-healed midline  scar Heart: regular rate and rhythm, S1, S2 normal, no murmur, click, rub or gallop Abdomen: soft, non-tender; bowel sounds normal; no masses,  no organomegaly Extremities: extremities normal, atraumatic, no cyanosis or edema Pulses: 2+ and symmetric Skin: Skin color, texture, turgor normal. No rashes or lesions   EKG: Normal  sinus rhythm 86, right bundle branch block, inferior infarct pattern and lateral T wave changes-personally reviewed  ASSESSMENT: 1. Coronary artery disease status post four-vessel CABG (LIMA to LAD, SVG to PDA, SVG to ramus, SVG to diagonal)-04/06/2018, modified maze procedure and clipping of the left atrial appendage with 40 mm atrial cure clip 2. Abnormal Myoview stress test-LVEF 49%, mild inferior ischemia (02/2018) 3. Recurrent atrial fibrillation -  on Multaq with recent cardioversion (CHADSVASC 2) 4. RBBB 5. Normal LV function by recent echocardiogram 6. Remote syncopal episodes  PLAN: 1.   [name] continues to do well after four-vessel CABG in July 2019.  He had clipping of the left atrial appendage and maze procedure.  At this point he is well out from that procedure and can discontinue Eliquis.  He has had no recurrent atrial fibrillation.  He denies any chest pain.  He is very active and seems to be doing well.  Lipids are at goal with LDL less than 70.  No changes in his medications today.  He is not on any ACE inhibitor or beta-blocker due to his hypotension for which she is on low-dose fludrocortisone.  No changes to his medicines today.  Plan follow-up with me annually or sooner as necessary.  Chrystie NoseKenneth C. Hilty, MD, [facility] Hospital And Medical CenterFACC, FACP  [place]  [facility]  Medical Director of the [facility]  [facility] Diplomate of the American Board of Clinical Lipidology Attending Cardiologist  Direct Dial: [phone]  Fax: [phone]  Website:  [email or web address]  Kenneth C Hilty 05/24/2019, 8:31 AM

## 2019-06-21 ENCOUNTER — Telehealth: Payer: Self-pay | Admitting: Internal Medicine

## 2019-06-21 MED ORDER — CLONAZEPAM 1 MG PO TABS: 1.0000 mg | ORAL_TABLET | Freq: Two times a day (BID) | ORAL | 3 refills | Status: DC

## 2019-06-21 NOTE — Telephone Encounter (Signed)
Control database checked last refill: 05/17/2019 60 tabs TMY:TRZNB 04/01/2019, cpecpe VAP:OLID

## 2019-06-21 NOTE — Telephone Encounter (Signed)
pharmacy requested clonazePAM (KLONOPIN) 1 MG tablet on 06/17/2019 (chart does't reflect) patient completely out and would like request expedited with a follow up call today regarding the status, please advise. Informed patient please allow 48 to 72 hour business day turn around and always follow up with PCP to confirm request was received   [place], [place] [phone] (Phone) [phone] (Fax)

## 2019-06-21 NOTE — Telephone Encounter (Signed)
Sent in, in future if out or close to out call office. We typically require 2-3 business days to fill controlled scripts and it is his responsibility to call us to give [place] notice otherwise he does risk running out.

## 2019-06-21 NOTE — Telephone Encounter (Signed)
Patient states he has been out of this medication for 3 days.  States pharmacy has been requesting script since the 7th.   Is requesting this med to be sent in as soon as possible.

## 2019-07-15 NOTE — Telephone Encounter (Signed)
This encounter was created in error - please disregard.

## 2019-08-02 ENCOUNTER — Other Ambulatory Visit: Payer: Self-pay | Admitting: Internal Medicine

## 2019-08-03 IMAGING — CR DG CHEST 2V
2 series · 2 of 2 positions shown · non-contrast
Comparison: 03/10/2018

CLINICAL DATA: Preoperative evaluation for upcoming coronary bypass
grafting

EXAM:
CHEST - 2 VIEW

[w chest pa]
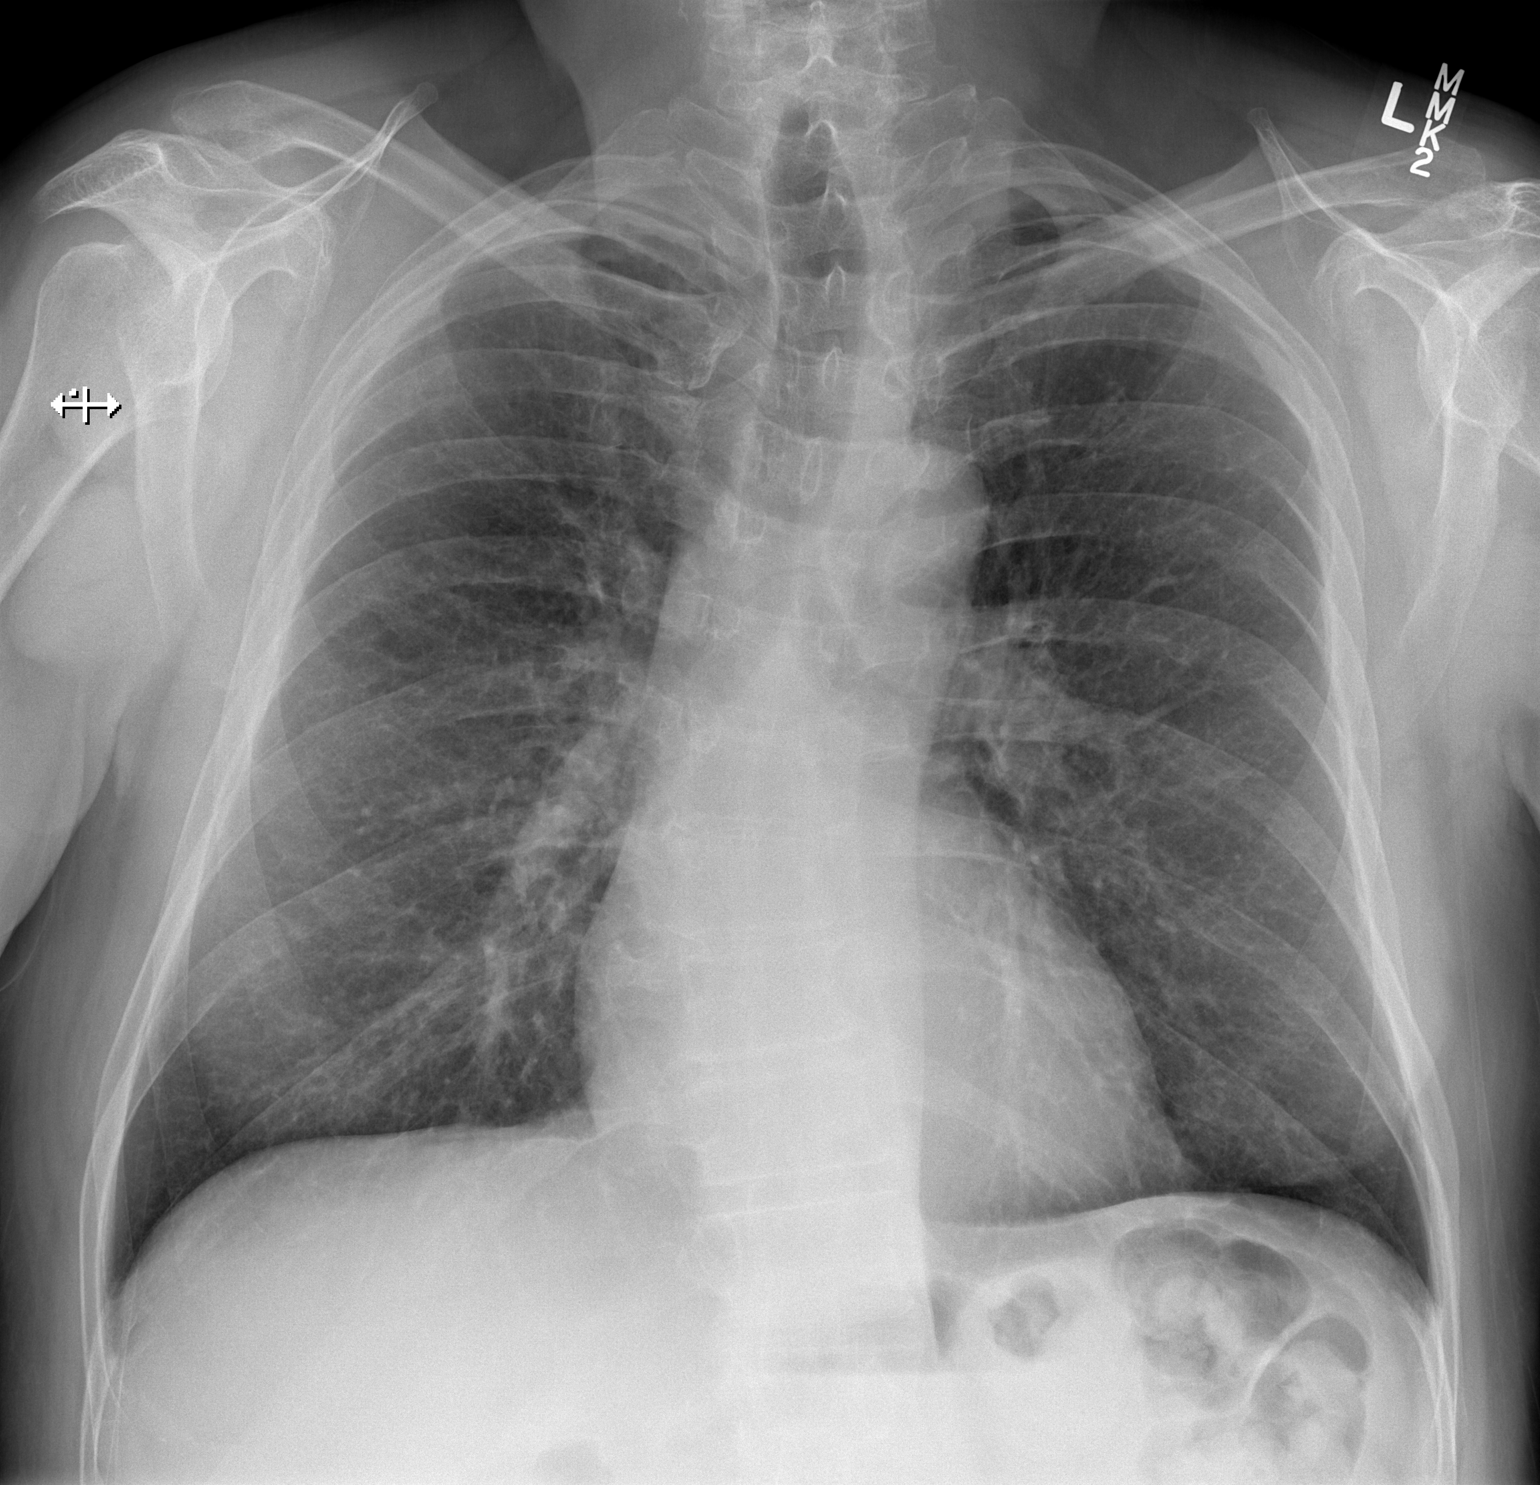

[w chest lat]
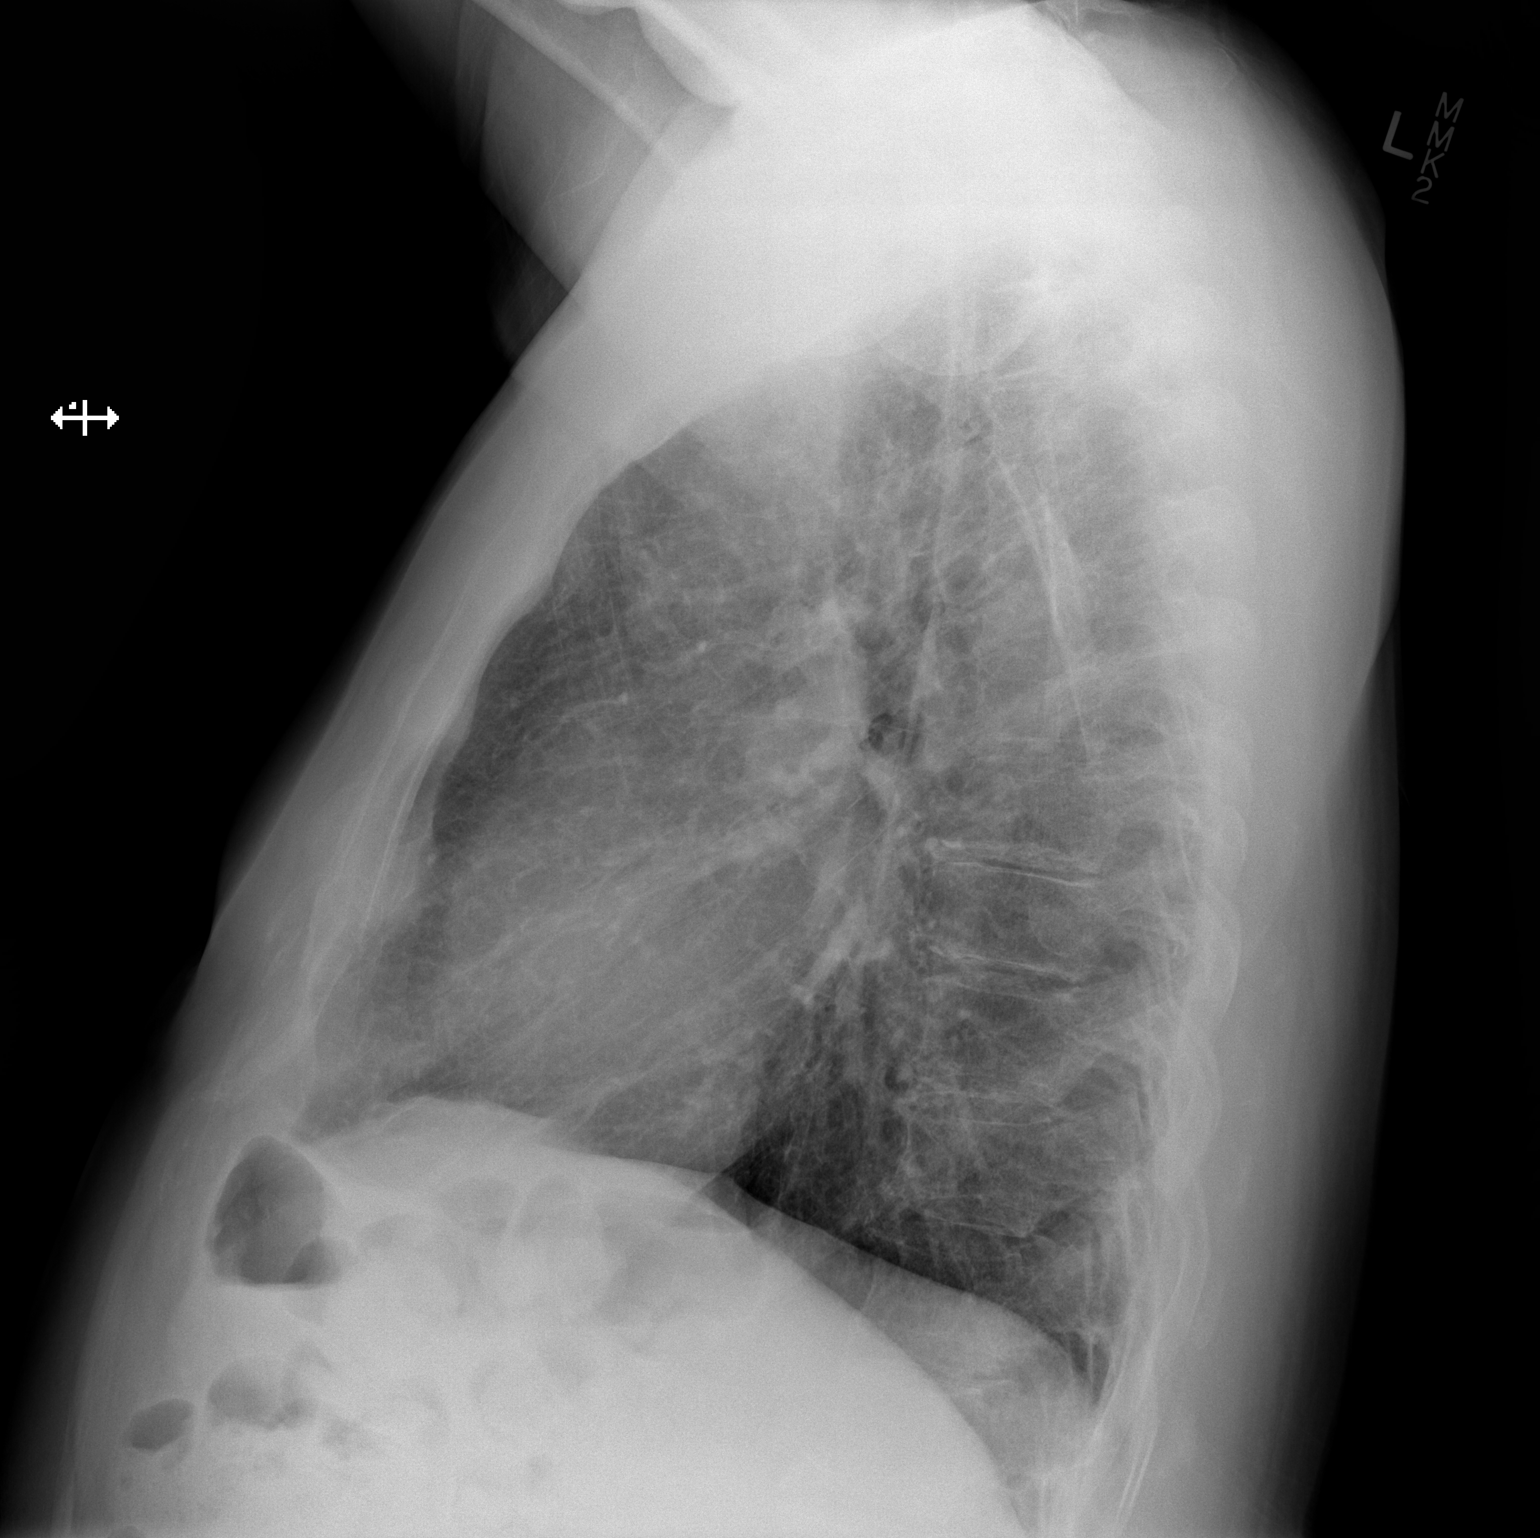

[2 of 2 positions shown; findings below may reference images not displayed]

FINDINGS: The heart size and mediastinal contours are within normal limits.
Both lungs are clear. The visualized skeletal structures are
unremarkable.
IMPRESSION: No active cardiopulmonary disease.

## 2019-08-07 IMAGING — DX DG CHEST 1V PORT
1 series · 1 of 1 positions shown · non-contrast
Comparison: Radiographs April 02, 2018.

CLINICAL DATA: Status post coronary artery bypass graft.

EXAM:
PORTABLE CHEST 1 VIEW

[chest ap]
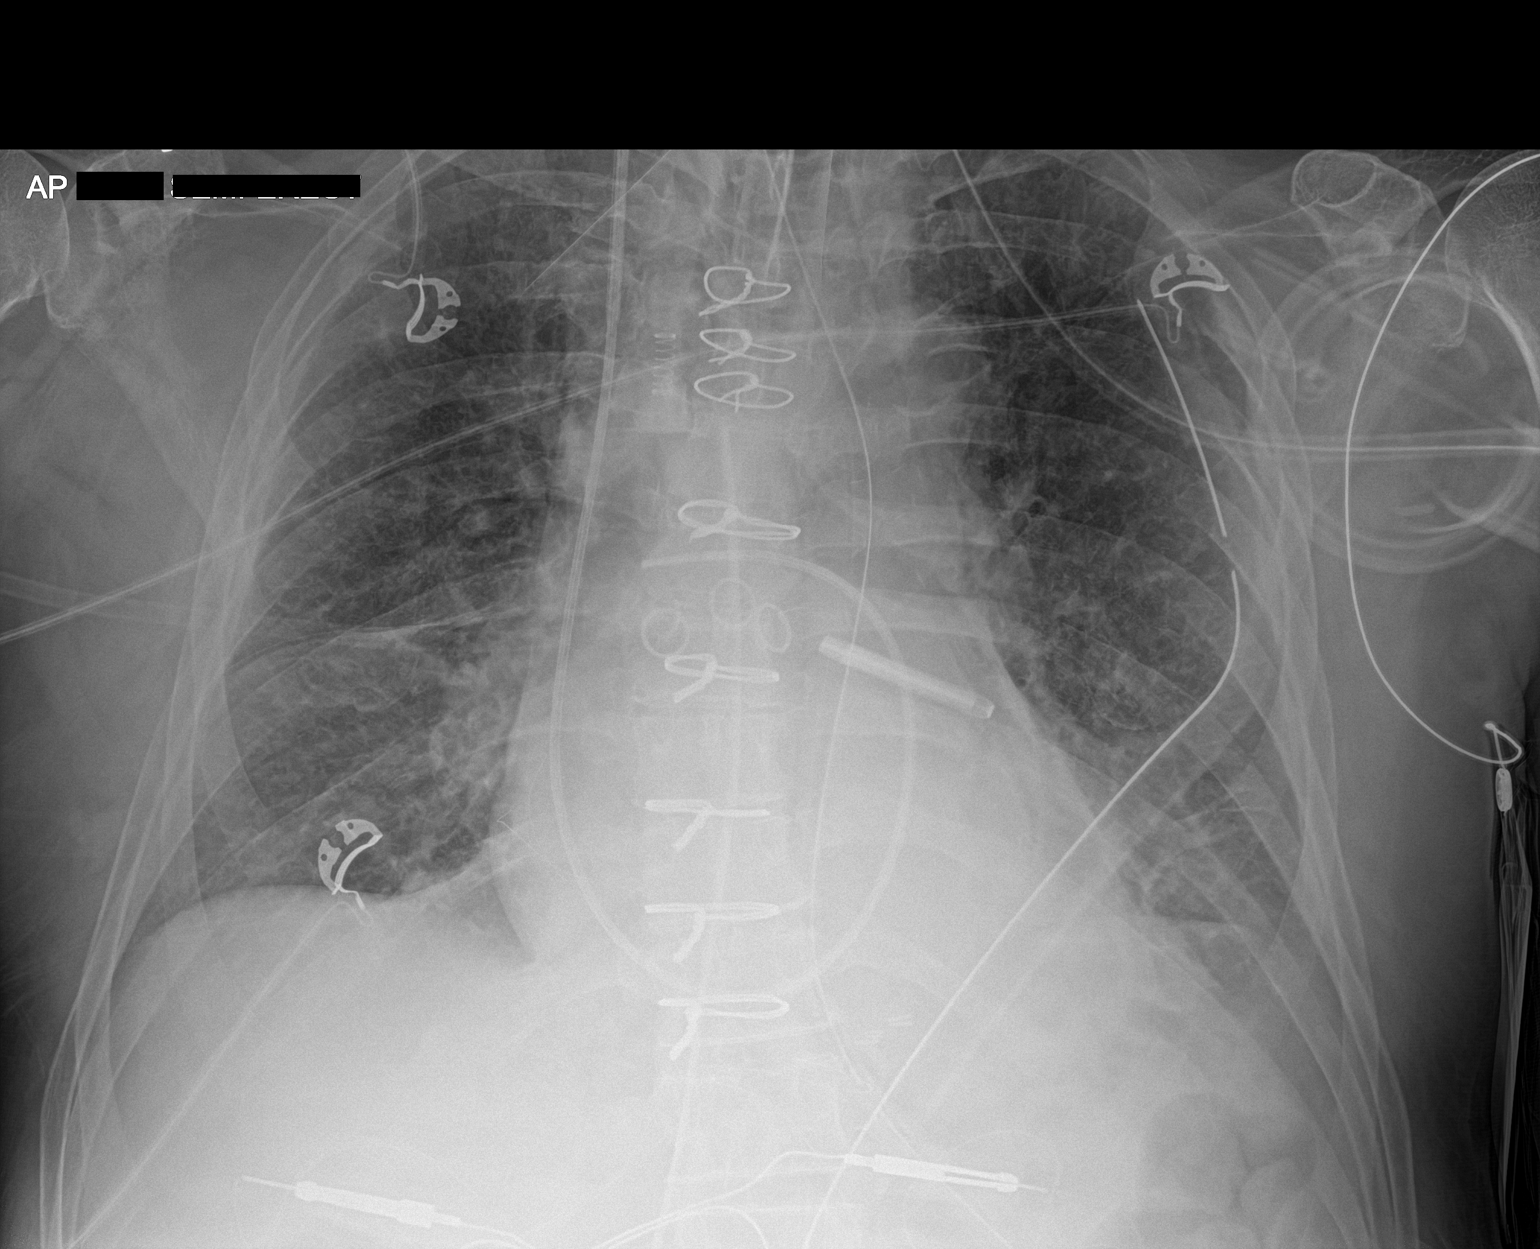

[1 of 1 positions shown; findings below may reference images not displayed]

FINDINGS: Stable cardiomediastinal silhouette. Status post coronary artery
bypass graft. Endotracheal tube and nasogastric tube are in good
position. Left-sided chest tube is noted without pneumothorax. Right
internal jugular Swan-Ganz catheter is seen with tip directed into
right pulmonary artery. Mild central pulmonary vascular congestion
is noted. No pneumothorax or significant pleural effusion is noted.
Mild bibasilar subsegmental atelectasis is noted. Bony thorax is
unremarkable.
IMPRESSION: Endotracheal and nasogastric tubes are in grossly good position.
Left-sided chest tube is noted without pneumothorax. Mild bibasilar
subsegmental atelectasis is noted.

## 2019-08-27 ENCOUNTER — Other Ambulatory Visit: Payer: Self-pay | Admitting: Internal Medicine

## 2019-10-12 ENCOUNTER — Other Ambulatory Visit: Payer: Self-pay | Admitting: Internal Medicine

## 2019-10-12 NOTE — Telephone Encounter (Signed)
Filled 09/14/2019 Clonazepam 1 Mg Tablet 60#  Last ov 04/01/19 Next ov n/s

## 2019-12-01 ENCOUNTER — Other Ambulatory Visit: Payer: Self-pay | Admitting: Internal Medicine

## 2019-12-06 ENCOUNTER — Encounter: Payer: Self-pay | Admitting: Internal Medicine

## 2019-12-10 DIAGNOSIS — L820 Inflamed seborrheic keratosis: Secondary | ICD-10-CM | POA: Diagnosis not present

## 2019-12-10 DIAGNOSIS — L723 Sebaceous cyst: Secondary | ICD-10-CM | POA: Diagnosis not present

## 2019-12-10 DIAGNOSIS — L821 Other seborrheic keratosis: Secondary | ICD-10-CM | POA: Diagnosis not present

## 2019-12-16 ENCOUNTER — Other Ambulatory Visit: Payer: Self-pay | Admitting: Internal Medicine

## 2020-02-22 ENCOUNTER — Other Ambulatory Visit: Payer: Self-pay | Admitting: Internal Medicine

## 2020-02-27 ENCOUNTER — Other Ambulatory Visit: Payer: Self-pay | Admitting: Internal Medicine

## 2020-03-01 ENCOUNTER — Ambulatory Visit (INDEPENDENT_AMBULATORY_CARE_PROVIDER_SITE_OTHER): Payer: Medicare Other | Admitting: Internal Medicine

## 2020-03-01 ENCOUNTER — Other Ambulatory Visit: Payer: Self-pay

## 2020-03-01 ENCOUNTER — Encounter: Payer: Self-pay | Admitting: Internal Medicine

## 2020-03-01 VITALS — BP 110/60 | HR 80 | Temp 98.9°F | Ht 72.0 in | Wt 224.8 lb

## 2020-03-01 DIAGNOSIS — I5022 Chronic systolic (congestive) heart failure: Secondary | ICD-10-CM

## 2020-03-01 DIAGNOSIS — E782 Mixed hyperlipidemia: Secondary | ICD-10-CM | POA: Diagnosis not present

## 2020-03-01 DIAGNOSIS — Z0000 Encounter for general adult medical examination without abnormal findings: Secondary | ICD-10-CM | POA: Diagnosis not present

## 2020-03-01 DIAGNOSIS — M545 Low back pain: Secondary | ICD-10-CM

## 2020-03-01 DIAGNOSIS — I480 Paroxysmal atrial fibrillation: Secondary | ICD-10-CM

## 2020-03-01 DIAGNOSIS — F411 Generalized anxiety disorder: Secondary | ICD-10-CM

## 2020-03-01 DIAGNOSIS — Z720 Tobacco use: Secondary | ICD-10-CM

## 2020-03-01 DIAGNOSIS — F5101 Primary insomnia: Secondary | ICD-10-CM

## 2020-03-01 DIAGNOSIS — G8929 Other chronic pain: Secondary | ICD-10-CM

## 2020-03-01 DIAGNOSIS — R7301 Impaired fasting glucose: Secondary | ICD-10-CM

## 2020-03-01 LAB — CBC
HCT: 41.4 % (ref 39.0–52.0)
Hemoglobin: 14.7 g/dL (ref 13.0–17.0)
MCHC: 35.5 g/dL (ref 30.0–36.0)
MCV: 97.2 fl (ref 78.0–100.0)
Platelets: 184 10*3/uL (ref 150.0–400.0)
RBC: 4.26 Mil/uL (ref 4.22–5.81)
RDW: 13 % (ref 11.5–15.5)
WBC: 7.8 10*3/uL (ref 4.0–10.5)

## 2020-03-01 LAB — LDL CHOLESTEROL, DIRECT: Direct LDL: 53 mg/dL

## 2020-03-01 LAB — COMPREHENSIVE METABOLIC PANEL
ALT: 31 U/L (ref 0–53)
AST: 26 U/L (ref 0–37)
Albumin: 4.4 g/dL (ref 3.5–5.2)
Alkaline Phosphatase: 60 U/L (ref 39–117)
BUN: 18 mg/dL (ref 6–23)
CO2: 28 mEq/L (ref 19–32)
Calcium: 9.3 mg/dL (ref 8.4–10.5)
Chloride: 106 mEq/L (ref 96–112)
Creatinine, Ser: 1.24 mg/dL (ref 0.40–1.50)
GFR: 57.51 mL/min — ABNORMAL LOW (ref >60.00)
Glucose, Bld: 91 mg/dL (ref 70–99)
Potassium: 3.8 mEq/L (ref 3.5–5.1)
Sodium: 140 mEq/L (ref 135–145)
Total Bilirubin: 0.9 mg/dL (ref 0.2–1.2)
Total Protein: 6.8 g/dL (ref 6.0–8.3)

## 2020-03-01 LAB — LIPID PANEL
Cholesterol: 105 mg/dL (ref 0–200)
HDL: 37.2 mg/dL — ABNORMAL LOW (ref >39.00)
NonHDL: 67.83
Total CHOL/HDL Ratio: 3
Triglycerides: 216 mg/dL — ABNORMAL HIGH (ref 0.0–149.0)
VLDL: 43.2 mg/dL — ABNORMAL HIGH (ref 0.0–40.0)

## 2020-03-01 LAB — HEMOGLOBIN A1C: Hgb A1c MFr Bld: 5.4 % (ref 4.6–6.5)

## 2020-03-01 NOTE — Assessment & Plan Note (Signed)
Stable on clonazepam and effexor. Denies worsening so will continue.

## 2020-03-01 NOTE — Assessment & Plan Note (Signed)
Flu shot due next season. Covid-19 counseled and declines for now. Pneumonia due declines. Shingrix due declines. Tetanus due 2026. Colonoscopy due declines thinks he got this at [facility] although we do not have records. Counseled about sun safety and mole surveillance. Counseled about the dangers of distracted driving. Given 10 year screening recommendations.

## 2020-03-01 NOTE — Assessment & Plan Note (Signed)
No A fib since MAZE per patient. Still seeing cardiology yearly. He is off eliquis due to persistent sinus and appears to be in sinus today.

## 2020-03-01 NOTE — Assessment & Plan Note (Signed)
Counseled to quit smoking.  

## 2020-03-01 NOTE — Assessment & Plan Note (Signed)
Taking trazodone for sleep which is working well.

## 2020-03-01 NOTE — Assessment & Plan Note (Signed)
Checking lipid panel and adjust lipitor 40 mg daily as needed. 

## 2020-03-01 NOTE — Progress Notes (Signed)
Subjective:   Patient ID: Jeremy King, male    DOB: 16-Oct-1950, 69 y.o.   MRN: [id number]  HPI Here for medicare wellness and physical, no new complaints. Please see A/P for status and treatment of chronic medical problems.   Diet: heart healthy Physical activity: sedentary Depression/mood screen: negative Hearing: moderate loss, aids bilateral but does not wear Visual acuity: grossly normal with lens, cataracts, performs annual eye exam  ADLs: capable Fall risk: none Home safety: good Cognitive evaluation: intact to orientation, naming, recall and repetition EOL planning: adv directives discussed    Office Visit from 03/01/2020 in [facility] at [facility] Total Score  0      I have personally reviewed and have noted 1. The patient's medical and social history - reviewed today no changes 2. Their use of alcohol, tobacco or illicit drugs 3. Their current medications and supplements 4. The patient's functional ability including ADL's, fall risks, home safety risks and hearing or visual impairment. 5. Diet and physical activities 6. Evidence for depression or mood disorders 7. Care team reviewed and updated  Patient Care Team: [name], MD as PCP - General (Internal Medicine) Rennis Golden Lisette Abu, MD as PCP - Cardiology (Cardiology) Patient, No Pcp Per (General Practice) Past Medical History:  Diagnosis Date  . Anxiety   . Back ache   . Coronary artery disease   . History of kidney stones   . Hypotension   . Kidney stones   . Paroxysmal atrial fibrillation (HCC)    chads2vasc score is at least 1  . Syncope    due to hypotensin/ complicated by RVR with afib   Past Surgical History:  Procedure Laterality Date  . CARDIAC CATHETERIZATION    . CARDIOVERSION N/A 12/30/2014   Procedure: CARDIOVERSION;  Surgeon: Chrystie Nose, MD;  Location: [facility] ENDOSCOPY;  Service: Cardiovascular;  Laterality: N/A;  . CARDIOVERSION N/A 02/16/2018   Procedure:  CARDIOVERSION;  Surgeon: Chrystie Nose, MD;  Location: [facility] ENDOSCOPY;  Service: Cardiovascular;  Laterality: N/A;  . CLIPPING OF ATRIAL APPENDAGE Left 04/06/2018   Procedure: CLIPPING OF ATRIAL APPENDAGE USING ATRICURE ATRICLIP ACHV40;  Surgeon: Kerin Perna, MD;  Location: [facility] OR;  Service: Open Heart Surgery;  Laterality: Left;  . CORONARY ARTERY BYPASS GRAFT N/A 04/06/2018   Procedure: CORONARY ARTERY BYPASS GRAFTING (CABG) x 4 WITH ENDOSCOPIC HARVESTING OF RIGHT SAPHENOUS VEIN;  Surgeon: Kerin Perna, MD;  Location: [facility] OR;  Service: Open Heart Surgery;  Laterality: N/A;  . LEFT HEART CATH AND CORONARY ANGIOGRAPHY N/A 03/12/2018   Procedure: LEFT HEART CATH AND CORONARY ANGIOGRAPHY;  Surgeon: [name], MD;  Location: [facility] INVASIVE CV LAB;  Service: Cardiovascular;  Laterality: N/A;  . LITHOTRIPSY    . MAZE N/A 04/06/2018   Procedure: MAZE;  Surgeon: Kerin Perna, MD;  Location: [facility] OR;  Service: Open Heart Surgery;  Laterality: N/A;  . RIGHT ROTATOR CUFF    . TEE WITHOUT CARDIOVERSION N/A 12/30/2014   Procedure: TRANSESOPHAGEAL ECHOCARDIOGRAM (TEE);  Surgeon: Chrystie Nose, MD;  Location: [facility] ENDOSCOPY;  Service: Cardiovascular;  Laterality: N/A;  . TEE WITHOUT CARDIOVERSION N/A 04/06/2018   Procedure: TRANSESOPHAGEAL ECHOCARDIOGRAM (TEE);  Surgeon: Donata Clay, Theron Arista, MD;  Location: [facility] OR;  Service: Open Heart Surgery;  Laterality: N/A;   Family History  Problem Relation Age of Onset  . Heart failure Mother        mitral valve disease  . Diabetes Father   . Heart  failure Father        MI  . Hypertension Father   . CAD Father   . Alcoholism Father   . Cancer Sister    Review of Systems  Constitutional: Negative.   HENT: Negative.   Eyes: Negative.   Respiratory: Negative for cough, chest tightness and shortness of breath.   Cardiovascular: Negative for chest pain, palpitations and leg swelling.  Gastrointestinal: Negative for abdominal distention, abdominal pain,  constipation, diarrhea, nausea and vomiting.  Musculoskeletal: Negative.   Skin: Negative.   Neurological: Negative.   Psychiatric/Behavioral: Negative.     Objective:  Physical Exam Constitutional:      Appearance: He is well-developed.  HENT:     Head: Normocephalic and atraumatic.  Cardiovascular:     Rate and Rhythm: Normal rate and regular rhythm.  Pulmonary:     Effort: Pulmonary effort is normal. No respiratory distress.     Breath sounds: Normal breath sounds. No wheezing or rales.  Abdominal:     General: Bowel sounds are normal. There is no distension.     Palpations: Abdomen is soft.     Tenderness: There is no abdominal tenderness. There is no rebound.  Musculoskeletal:     Cervical back: Normal range of motion.  Skin:    General: Skin is warm and dry.  Neurological:     Mental Status: He is alert and oriented to person, place, and time.     Coordination: Coordination normal.    Vitals:   03/01/20 0820  BP: 110/60  Pulse: 80  Temp: 98.9 F (37.2 C)  SpO2: 99%  Weight: 224 lb 12.8 oz (102 kg)  Height: 6' (1.829 m)   This visit occurred during the SARS-CoV-2 public health emergency.  Safety protocols were in place, including screening questions prior to the visit, additional usage of staff PPE, and extensive cleaning of exam room while observing appropriate contact time as indicated for disinfecting solutions.   Assessment & Plan:

## 2020-03-01 NOTE — Assessment & Plan Note (Signed)
Doing stretches daily now and less pain overall.

## 2020-03-01 NOTE — Patient Instructions (Signed)
We are checking the labs today.  Health Maintenance, Male Adopting a healthy lifestyle and getting preventive care are important in promoting health and wellness. Ask your health care provider about:  The right schedule for you to have regular tests and exams.  Things you can do on your own to prevent diseases and keep yourself healthy. What should I know about diet, weight, and exercise? Eat a healthy diet   Eat a diet that includes plenty of vegetables, fruits, low-fat dairy products, and lean protein.  Do not eat a lot of foods that are high in solid fats, added sugars, or sodium. Maintain a healthy weight Body mass index (BMI) is a measurement that can be used to identify possible weight problems. It estimates body fat based on height and weight. Your health care provider can help determine your BMI and help you achieve or maintain a healthy weight. Get regular exercise Get regular exercise. This is one of the most important things you can do for your health. Most adults should:  Exercise for at least 150 minutes each week. The exercise should increase your heart rate and make you sweat (moderate-intensity exercise).  Do strengthening exercises at least twice a week. This is in addition to the moderate-intensity exercise.  Spend less time sitting. Even light physical activity can be beneficial. Watch cholesterol and blood lipids Have your blood tested for lipids and cholesterol at 71 years of age, then have this test every 5 years. You may need to have your cholesterol levels checked more often if:  Your lipid or cholesterol levels are high.  You are older than 71 years of age.  You are at high risk for heart disease. What should I know about cancer screening? Many types of cancers can be detected early and may often be prevented. Depending on your health history and family history, you may need to have cancer screening at various ages. This may include screening  for:  Colorectal cancer.  Prostate cancer.  Skin cancer.  Lung cancer. What should I know about heart disease, diabetes, and high blood pressure? Blood pressure and heart disease  High blood pressure causes heart disease and increases the risk of stroke. This is more likely to develop in people who have high blood pressure readings, are of African descent, or are overweight.  Talk with your health care provider about your target blood pressure readings.  Have your blood pressure checked: ? Every 3-5 years if you are 83-57 years of age. ? Every year if you are 86 years old or older.  If you are between the ages of 60 and 82 and are a current or former smoker, ask your health care provider if you should have a one-time screening for abdominal aortic aneurysm (AAA). Diabetes Have regular diabetes screenings. This checks your fasting blood sugar level. Have the screening done:  Once every three years after age 29 if you are at a normal weight and have a low risk for diabetes.  More often and at a younger age if you are overweight or have a high risk for diabetes. What should I know about preventing infection? Hepatitis B If you have a higher risk for hepatitis B, you should be screened for this virus. Talk with your health care provider to find out if you are at risk for hepatitis B infection. Hepatitis C Blood testing is recommended for:  Everyone born from [birth year] through 1967.  Anyone with known risk factors for hepatitis C. Sexually transmitted infections (STIs)  You should be screened each year for STIs, including gonorrhea and chlamydia, if: ? You are sexually active and are younger than 71 years of age. ? You are older than 71 years of age and your health care provider tells you that you are at risk for this type of infection. ? Your sexual activity has changed since you were last screened, and you are at increased risk for chlamydia or gonorrhea. Ask your health care  provider if you are at risk.  Ask your health care provider about whether you are at high risk for HIV. Your health care provider may recommend a prescription medicine to help prevent HIV infection. If you choose to take medicine to prevent HIV, you should first get tested for HIV. You should then be tested every 3 months for as long as you are taking the medicine. Follow these instructions at home: Lifestyle  Do not use any products that contain nicotine or tobacco, such as cigarettes, e-cigarettes, and chewing tobacco. If you need help quitting, ask your health care provider.  Do not use street drugs.  Do not share needles.  Ask your health care provider for help if you need support or information about quitting drugs. Alcohol use  Do not drink alcohol if your health care provider tells you not to drink.  If you drink alcohol: ? Limit how much you have to 0-2 drinks a day. ? Be aware of how much alcohol is in your drink. In the [place] one drink equals one 12 oz bottle of beer (355 mL), one 5 oz glass of wine (148 mL), or one 1 oz glass of hard liquor (44 mL). General instructions  Schedule regular health, dental, and eye exams.  Stay current with your vaccines.  Tell your health care provider if: ? You often feel depressed. ? You have ever been abused or do not feel safe at home. Summary  Adopting a healthy lifestyle and getting preventive care are important in promoting health and wellness.  Follow your health care provider's instructions about healthy diet, exercising, and getting tested or screened for diseases.  Follow your health care provider's instructions on monitoring your cholesterol and blood pressure. This information is not intended to replace advice given to you by your health care provider. Make sure you discuss any questions you have with your health care provider. Document Revised: 09/16/2018 Document Reviewed: 09/16/2018 Elsevier Patient Education  2020  [employer].

## 2020-03-21 ENCOUNTER — Other Ambulatory Visit: Payer: Self-pay | Admitting: Internal Medicine

## 2020-03-28 ENCOUNTER — Other Ambulatory Visit: Payer: Self-pay | Admitting: Internal Medicine

## 2020-03-31 ENCOUNTER — Other Ambulatory Visit: Payer: Self-pay | Admitting: Internal Medicine

## 2020-04-01 ENCOUNTER — Encounter: Payer: Self-pay | Admitting: Internal Medicine

## 2020-04-03 MED ORDER — VENLAFAXINE HCL ER 150 MG PO CP24: ORAL_CAPSULE | ORAL | 3 refills | Status: DC

## 2020-04-12 ENCOUNTER — Encounter: Payer: Self-pay | Admitting: Internal Medicine

## 2020-04-13 MED ORDER — CLONAZEPAM 1 MG PO TABS: ORAL_TABLET | ORAL | 5 refills | Status: DC

## 2020-05-16 ENCOUNTER — Other Ambulatory Visit: Payer: Self-pay | Admitting: Internal Medicine

## 2020-05-16 ENCOUNTER — Other Ambulatory Visit: Payer: Self-pay | Admitting: Cardiovascular Disease

## 2020-05-22 ENCOUNTER — Telehealth: Payer: Self-pay | Admitting: Internal Medicine

## 2020-05-22 NOTE — Progress Notes (Signed)
  Chronic Care Management   Outreach Note  05/22/2020 Name: Jeremy King MRN: [id number] DOB: [birth year]  Referred by: [name], MD Reason for referral : No chief complaint on file.   An unsuccessful telephone outreach was attempted today. The patient was referred to the pharmacist for assistance with care management and care coordination.   Follow Up Plan:   Lynnae January [employer] Scheduler

## 2020-05-25 ENCOUNTER — Telehealth: Payer: Self-pay | Admitting: Internal Medicine

## 2020-05-25 NOTE — Progress Notes (Signed)
  Chronic Care Management   Outreach Note  05/25/2020 Name: ARTEMIS LOYAL MRN: [id number] DOB: 1951/05/22  Referred by: [name], MD Reason for referral : No chief complaint on file.   An unsuccessful telephone outreach was attempted today. The patient was referred to the pharmacist for assistance with care management and care coordination.   Follow Up Plan:   Lynnae January [employer]stream Scheduler

## 2020-05-31 ENCOUNTER — Other Ambulatory Visit: Payer: Self-pay

## 2020-05-31 ENCOUNTER — Ambulatory Visit (INDEPENDENT_AMBULATORY_CARE_PROVIDER_SITE_OTHER): Payer: Medicare Other | Admitting: Internal Medicine

## 2020-05-31 ENCOUNTER — Encounter: Payer: Self-pay | Admitting: Internal Medicine

## 2020-05-31 VITALS — BP 120/68 | HR 74 | Temp 97.1°F | Ht 73.0 in | Wt 206.0 lb

## 2020-05-31 DIAGNOSIS — I5043 Acute on chronic combined systolic (congestive) and diastolic (congestive) heart failure: Secondary | ICD-10-CM

## 2020-05-31 DIAGNOSIS — Z951 Presence of aortocoronary bypass graft: Secondary | ICD-10-CM | POA: Diagnosis not present

## 2020-05-31 DIAGNOSIS — I5022 Chronic systolic (congestive) heart failure: Secondary | ICD-10-CM | POA: Diagnosis not present

## 2020-05-31 DIAGNOSIS — I951 Orthostatic hypotension: Secondary | ICD-10-CM | POA: Diagnosis not present

## 2020-05-31 NOTE — Progress Notes (Addendum)
OFFICE NOTE  Chief Complaint:  Routine follow-up  Primary Care Physician: [name], MD  HPI:  Jeremy KEZIAH is a pleasant 69 year old male with few medical problems. He has a history of anxiety as well as dyslipidemia. Unfortunately he's had a number of syncopal episodes and has known orthostatic hypotension. He tells me that couple years ago he was seen at the [facility] and syncopized. Blood pressure was noted to be 60 over palpation. He was hospitalized for this and treated but is had recurrent hypotension. He was previously on Flomax which was discontinued. He does have a history of kidney stones with what sounds like ureteral stenting and/or lithotripsy. He probably saw a cardiologist once at the [place] and he tells me that they did not recommend any different medications. His CHADSVASC score is 1 given his age of 70 and hypertension. He has been maintained on aspirin. Recently he was seen in [facility] after a syncopal episode. He underwent cardiac workup including echocardiogram which shows normal LV function. He was noted to be in A. fib at that time. He says that it comes and goes but is not clear whether it has become more persistent. He's never had a cardioversion before. He denies significant alcohol use but has been a long-term smoker. There also is a strong family history of coronary disease in both parents. Recently he's been having problems with his knee and is contemplating left knee surgery with Dr. Ciro Backer.  It should be noted in the office today that he became dizzy after sitting up for his EKG. Orthostatic blood pressures were taken and he had a 20 point drop in systolic blood pressure with change in position.  I saw Mr. Alviar back in the office today. Unfortunately his home monitor has been alarming as he's had frequent atrial fibrillation with rapid ventricular response. Heart rates have been up into the 180s. He also had a near-syncopal episode. He  unfortunately did not check his blood pressure at that time. He's been pretty much in persistent A. fib since we placed the monitor. I think this is at least contributing to his symptoms. 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 Exam is not consistent with heart failure. Blood pressure is slightly improved today at 100 systolic, however it is limited any AV nodal blocking medications. He's currently only on aspirin because of a low CHADSVASC score.  Mr. Mustin returns today for follow-up. He successfully cardioverted and is maintaining sinus bradycardia. He's worn a Holter monitor which demonstrates several episodes of significant bradycardia into the 30s. He's had some sinus pauses of 2-1/2-3 seconds, which appear mostly at night. Laboratory work is unremarkable and digoxin level is 1.7. The digoxin, again was started for heart rate control prior to cardioversion as his blood pressure was less than 90 systolic with significant orthostasis. I also discontinued his trazodone which seems to have significantly improved his orthostasis. Blood pressure remains low today at 98/50. He underwent a nuclear stress test which showed 2 small fixed defects which were thought to be either small areas of scar or artifact. His echo showed normal wall motion and preserved systolic function. He is not describing anginal symptoms.  Mr. Mcelreath returns today for follow-up. He has since seen Dr. Johney Frame who put him on multiecho for control of his atrial fibrillation. Overall he feels that he may be doing well with this although yesterday he thought his heart rate was irregular. From what he described it sounds like  it was regularly irregular, this may not be atrial fibrillation. He seems to be tolerating this and the Eliquis without any bleeding problems. He is come off of trazodone due to problems with orthostatic hypotension and syncope. He's not had any further episodes. He is having problems sleeping at night  though. He was on melatonin which was initially helpful but however since it has not been helpful. In the past we talked about the possibility of obstructive sleep apnea or other sleep related disorder. He was hesitant to get a sleep study but now seems to be interested in that.  09/19/2016  [name] returns for follow-up. Overall he is doing well without recurrent atrial fibrillation. He is maintained on multiecho. She is EKG shows sinus rhythm with a PAC an incomplete right bundle branch block. QTC is 437 ms. He is on Eliquis and denies any significant bleeding problems. He uses melatonin for sleep at night and takes 15 mg. Is generally helps him get 5-6 hours of sleep. He denies any chest pain or worsening shortness of breath.  02/05/2018  [name] was seen today in follow-up.  He says he is feeling well but unfortunately had a recent fall.  He said he was building a ramp for his kayak and somehow tripped and landed on the edge of the ramp.  He has a large area of ecchymosis over the right cheek which extends down into the right anterior neck and some suborbital ecchymosis.  He denies any pain or tenderness concerning for fracture.  He had no mental status changes or loss of consciousness.  He had no significant external bleeding.  He continues to take his Eliquis.  He says is been compliant with it and has not missed doses.  Of note, he is back in atrial fibrillation today.  He was very surprised to hear this since he is feeling well.  In the past he has been very symptomatic and has felt dizzy and passed out.  It may be because his rate is better controlled on FrostJeannette around.  This is the first episode of A. fib in the past several years.  02/18/2018  [name] returns today for follow-up.  He underwent cardioversion on Monday and successfully converted back to sinus rhythm after single shock.  He says he actually felt a lot better with some more energy however yesterday felt somewhat fatigued.   He checked his blood pressure and his machine noted that he was out of rhythm.  He also felt somewhat fatigued.  Today's EKG shows she is back in A. fib at 74.  This represents breakthrough of his Multitak.  We discussed options for treatment today including washing out his Multitak and switching to an alternate antiarrhythmic medication or referral for A. fib ablation.  He would like to try medical therapy.  I mention if we were to start a 1C agent, he would need some ischemia evaluation since his last stress test was in 2016.  03/09/2018  [name] returns for follow-up of stress test.  He underwent a nuclear stress test on 02/27/2018.  This showed a mildly reduced LVEF at 49%.  There was mild septal and inferior wall ischemia from the apex to base.  This was suggested as low risk however abnormal.  He denies any chest pain or worsening shortness of breath.  He does report some fatigue however this may be related to A. fib.  He noted to be have slightly more energy after converting back to sinus rhythm  for about a day and then went back into A. fib.  This is why proposed antiarrhythmic therapy.  Previously had been on Multaq for years and then broke through with recurrent AF.  The plan was to switch him to a different antiarrhythmic such as flecainide, but we needed to make sure that he had no coronary disease prior to that.  I discussed the stress test with him including the fact that it may represent a false positive.  Nonetheless a definitive coronary evaluation is warranted.  It could be also that if he has underlying coronary disease, that may be why he did not remain in normal rhythm after a successful cardioversion.  07/16/2018  Mr. Corrales returns for hospital follow-up.  Overall he is doing well.  He underwent four-vessel CABG as well as maze, clipping of the left atrial appendage and is back in sinus rhythm now on amiodarone.  He is also on Eliquis for anticoagulation.  He reports no significant  symptoms and said he had no real pain with the procedure.  Per Dr. Maren Beach, we are considering taking him off of amiodarone and eventually discontinuing his Eliquis.  05/24/2019  Mr. Severt is seen today in follow-up.  He continues to do very well post surgery.  He is now more than 3 months after maze and left atrial appendage closure.  We previously discontinued amiodarone and discussed stopping his Eliquis.  He denies any chest pain or worsening shortness of breath.  He has been on low-dose fludrocortisone due to some hypotension.  Otherwise labs are well controlled.  His lipid profile showed LDL at goal less than 70.  EKG today shows sinus rhythm with a right bundle branch block at 86 and is unchanged.  He denies any recurrent A. Fib.  05/31/2020  Mr. Luz Brazen returns today for annual follow-up.  He continues to do well now couple years after surgery.  He denies any shortness of breath or chest pain.  He has had no recurrent atrial fibrillation.  Of note he had an issue with syncope shortly after his surgery which has not recurred.  This resolved after decreasing his beta-blocker and starting low-dose fludrocortisone.  Blood pressures were not low but he was described as being orthostatic, however I did not see clear orthostatic vital signs.  LDL most recently was 53.  PMHx:  Past Medical History:  Diagnosis Date  . Anxiety   . Back ache   . Coronary artery disease   . History of kidney stones   . Hypotension   . Kidney stones   . Paroxysmal atrial fibrillation (HCC)    chads2vasc score is at least 1  . Syncope    due to hypotensin/ complicated by RVR with afib    Past Surgical History:  Procedure Laterality Date  . CARDIAC CATHETERIZATION    . CARDIOVERSION N/A 12/30/2014   Procedure: CARDIOVERSION;  Surgeon: Chrystie Nose, MD;  Location: [facility] ENDOSCOPY;  Service: Cardiovascular;  Laterality: N/A;  . CARDIOVERSION N/A 02/16/2018   Procedure: CARDIOVERSION;  Surgeon: Chrystie Nose,  MD;  Location: [facility] ENDOSCOPY;  Service: Cardiovascular;  Laterality: N/A;  . CLIPPING OF ATRIAL APPENDAGE Left 04/06/2018   Procedure: CLIPPING OF ATRIAL APPENDAGE USING ATRICURE ATRICLIP ACHV40;  Surgeon: Kerin Perna, MD;  Location: [facility] OR;  Service: Open Heart Surgery;  Laterality: Left;  . CORONARY ARTERY BYPASS GRAFT N/A 04/06/2018   Procedure: CORONARY ARTERY BYPASS GRAFTING (CABG) x 4 WITH ENDOSCOPIC HARVESTING OF RIGHT SAPHENOUS VEIN;  Surgeon: Donata Clay,  Theron Arista, MD;  Location: [facility] OR;  Service: Open Heart Surgery;  Laterality: N/A;  . LEFT HEART CATH AND CORONARY ANGIOGRAPHY N/A 03/12/2018   Procedure: LEFT HEART CATH AND CORONARY ANGIOGRAPHY;  Surgeon: [name], MD;  Location: [facility] INVASIVE CV LAB;  Service: Cardiovascular;  Laterality: N/A;  . LITHOTRIPSY    . MAZE N/A 04/06/2018   Procedure: MAZE;  Surgeon: Kerin Perna, MD;  Location: [facility] OR;  Service: Open Heart Surgery;  Laterality: N/A;  . RIGHT ROTATOR CUFF    . TEE WITHOUT CARDIOVERSION N/A 12/30/2014   Procedure: TRANSESOPHAGEAL ECHOCARDIOGRAM (TEE);  Surgeon: Chrystie Nose, MD;  Location: [facility] ENDOSCOPY;  Service: Cardiovascular;  Laterality: N/A;  . TEE WITHOUT CARDIOVERSION N/A 04/06/2018   Procedure: TRANSESOPHAGEAL ECHOCARDIOGRAM (TEE);  Surgeon: Donata Clay, Theron Arista, MD;  Location: [facility] OR;  Service: Open Heart Surgery;  Laterality: N/A;    FAMHx:  Family History  Problem Relation Age of Onset  . Heart failure Mother        mitral valve disease  . Diabetes Father   . Heart failure Father        MI  . Hypertension Father   . CAD Father   . Alcoholism Father   . Cancer Sister     SOCHx:   reports that he has been smoking cigarettes. He has been smoking about 1.00 pack per day. He has quit using smokeless tobacco. He reports that he does not drink alcohol and does not use drugs.  ALLERGIES:  Allergies  Allergen Reactions  . Sertraline Hcl Other (See Comments)    Extreme headaches    ROS: Pertinent items noted in  HPI and remainder of comprehensive ROS otherwise negative.  HOME MEDS: Current Outpatient Medications  Medication Sig Dispense Refill  . Ascorbic Acid (VITAMIN C) 1000 MG tablet Take 1,000 mg by mouth daily.    [name] aspirin EC 81 MG EC tablet Take 1 tablet (81 mg total) by mouth daily.    [name] atorvastatin (LIPITOR) 40 MG tablet TAKE 1 TABLET(40 MG) BY MOUTH DAILY AT 6 PM 30 tablet 0  . clonazePAM (KLONOPIN) 1 MG tablet TAKE 1 TABLET(1 MG) BY MOUTH TWICE DAILY 60 tablet 5  . Cyanocobalamin-Methylcobalamin (B-12 ULTRA PO) Take by mouth.    . Garlic 1000 MG CAPS Take 1,000 mg by mouth daily.     . Hypromellose (ARTIFICIAL TEARS OP) Place 1 drop into both eyes 3 (three) times daily as needed (for dry eyes).     . Multiple Vitamin (MULTIVITAMIN WITH MINERALS) TABS tablet Take 1 tablet by mouth daily. One-A-Day Men's    . ramelteon (ROZEREM) 8 MG tablet Take 1 tablet (8 mg total) by mouth at bedtime. 30 tablet 3  . traZODone (DESYREL) 100 MG tablet TAKE 2 TABLETS(200 MG) BY MOUTH AT BEDTIME 180 tablet 1  . triamcinolone cream (KENALOG) 0.1 % Apply 1 application topically 2 (two) times daily. (Patient taking differently: Apply 1 application topically 2 (two) times daily as needed (for rough spots on ear). ) 30 g 0  . venlafaxine XR (EFFEXOR-XR) 150 MG 24 hr capsule TAKE 1 CAPSULE BY MOUTH EVERY MORNING WITH BREAKFAST 90 capsule 3   No current facility-administered medications for this visit.    LABS/IMAGING: No results found for this or any previous visit (from the past 48 hour(s)). No results found.  VITALS: BP 120/68   Pulse 74   Temp (!) 97.1 F (36.2 C)   Ht 6\' 1"  (1.854 m)   Wt  206 lb (93.4 kg)   SpO2 92%   BMI 27.18 kg/m   EXAM: General appearance: alert and no distress Neck: no carotid bruit, no JVD and thyroid not enlarged, symmetric, no tenderness/mass/nodules Lungs: clear to auscultation bilaterally and Well-healed midline scar Heart: regular rate and rhythm, S1, S2 normal, no  murmur, click, rub or gallop Abdomen: soft, non-tender; bowel sounds normal; no masses,  no organomegaly Extremities: extremities normal, atraumatic, no cyanosis or edema Pulses: 2+ and symmetric Skin: Skin color, texture, turgor normal. No rashes or lesions  EKG: Normal sinus rhythm, bifascicular block at 74-personally reviewed  ASSESSMENT: 1. Coronary artery disease status post four-vessel CABG (LIMA to LAD, SVG to PDA, SVG to ramus, SVG to diagonal)-04/06/2018, modified maze procedure and clipping of the left atrial appendage with 40 mm atrial cure clip 2. Abnormal Myoview stress test-LVEF 49%, mild inferior ischemia (02/2018) 3. Recurrent atrial fibrillation -  on Multaq with recent cardioversion (CHADSVASC 2) 4. RBBB 5. Normal LV function by recent echocardiogram 6. Remote syncopal episodes  PLAN: 1.   Mr. Geoghegan continues to do well without any new cardiac symptoms.  He has been on low-dose fludrocortisone with good blood pressure and no evidence for syncope.  Recently he has lost more weight.  Overall he feels well.  Not sure that he needs a fludrocortisone since he also had his blood pressure medications decreased at the time he was having syncope.  We will plan to discontinue it and see how he does.  He should monitor it and if he has any more episodes we may need to restart it.  Follow-up with me annually or sooner as necessary.  Chrystie Nose, MD, [facility], FACP  [place]  [facility]  Medical Director of the [facility] Lipid [facility] &  Cardiovascular Risk Reduction Clinic Diplomate of the American Board of Clinical Lipidology Attending Cardiologist  Direct Dial: [phone]  Fax: [phone]  Website:  [name] Mashonda Broski 05/31/2020, 10:29 PM

## 2020-05-31 NOTE — Patient Instructions (Signed)
Medication Instructions:  STOP fludrocortisone  -- monitor your BP and symptoms   *If you need a refill on your cardiac medications before your next appointment, please call your pharmacy*   Testing/Procedures: Echocardiogram August 2022   Follow-Up: At [facility], you and your health needs are our priority.  As part of our continuing mission to provide you with exceptional heart care, we have created designated Provider Care Teams.  These Care Teams include your primary Cardiologist (physician) and Advanced Practice Providers (APPs -  Physician Assistants and Nurse Practitioners) who all work together to provide you with the care you need, when you need it.  We recommend signing up for the patient portal called "MyChart".  Sign up information is provided on this After Visit Summary.  MyChart is used to connect with patients for Virtual Visits (Telemedicine).  Patients are able to view lab/test results, encounter notes, upcoming appointments, etc.  Non-urgent messages can be sent to your provider as well.   To learn more about what you can do with MyChart, go to [email or web address].    Your next appointment:   12 month(s)  The format for your next appointment:   In Person  Provider:   You may see Chrystie Nose, MD or one of the following Advanced Practice Providers on your designated Care Team:    Azalee Course, PA-C  Micah Flesher, PA-C or   Judy Pimple, [place]    Other Instructions

## 2020-06-06 MED ORDER — ATORVASTATIN CALCIUM 40 MG PO TABS: ORAL_TABLET | ORAL | 3 refills | Status: DC

## 2020-06-15 ENCOUNTER — Other Ambulatory Visit: Payer: Self-pay | Admitting: Cardiovascular Disease

## 2020-09-08 ENCOUNTER — Encounter: Payer: Self-pay | Admitting: Internal Medicine

## 2020-09-09 ENCOUNTER — Encounter: Payer: Self-pay | Admitting: Internal Medicine

## 2020-10-14 ENCOUNTER — Other Ambulatory Visit: Payer: Self-pay | Admitting: Internal Medicine

## 2020-10-17 NOTE — Telephone Encounter (Signed)
[place] Controlled Database Checked Last filled:  09/14/20  # 60 LOV w/you:  03/01/20  Next appt w/you:  none

## 2020-10-24 ENCOUNTER — Other Ambulatory Visit: Payer: Self-pay | Admitting: Internal Medicine

## 2021-03-19 ENCOUNTER — Other Ambulatory Visit: Payer: Self-pay

## 2021-03-19 ENCOUNTER — Ambulatory Visit (INDEPENDENT_AMBULATORY_CARE_PROVIDER_SITE_OTHER): Payer: Medicare Other

## 2021-03-19 VITALS — BP 102/60 | HR 68 | Temp 98.2°F | Ht 73.0 in | Wt 214.8 lb

## 2021-03-19 DIAGNOSIS — Z0000 Encounter for general adult medical examination without abnormal findings: Secondary | ICD-10-CM | POA: Diagnosis not present

## 2021-03-19 DIAGNOSIS — Z1210 Encounter for screening for malignant neoplasm of intestinal tract, unspecified: Secondary | ICD-10-CM

## 2021-03-19 NOTE — Patient Instructions (Signed)
Jeremy King , Thank you for taking time to come for your Medicare Wellness Visit. I appreciate your ongoing commitment to your health goals. Please review the following plan we discussed and let me know if I can assist you in the future.   Screening recommendations/referrals: Colonoscopy: last done 03/31/2006; due every 10 years (Overdue) Recommended yearly ophthalmology/optometry visit for glaucoma screening and checkup Recommended yearly dental visit for hygiene and checkup  Vaccinations: Influenza vaccine: 06/2020; due Fall 2022 Pneumococcal vaccine: 11/27/2014, 03/02/2015 Tdap vaccine: 03/17/2015; due every 10 years Shingles vaccine: never done   Covid-19: never done  Advanced directives: Please bring a copy of your health care power of attorney and living will to the office at your convenience.  Conditions/risks identified: Goals reviewed. Goals:  Recommend to drink at least 6-8 8oz glasses of water per day.   Recommend to exercise for at least 150 minutes per week.   Recommend to remove any items from the home that may cause slips or trips.   Recommend to decrease portion sizes by eating 3 small healthy meals and at least 2 healthy snacks per day.   Recommend to begin DASH diet as directed below   Recommend to continue efforts to reduce smoking habits until no longer smoking. Smoking Cessation literature is attached below.  Next appointment: Please schedule your next Medicare Wellness Visit with your Nurse Health Advisor in 1 year by calling [phone].  Preventive Care 72 Years and Older, Male Preventive care refers to lifestyle choices and visits with your health care provider that can promote health and wellness. What does preventive care include? A yearly physical exam. This is also called an annual well check. Dental exams once or twice a year. Routine eye exams. Ask your health care provider how often you should have your eyes checked. Personal lifestyle choices,  including: Daily care of your teeth and gums. Regular physical activity. Eating a healthy diet. Avoiding tobacco and drug use. Limiting alcohol use. Practicing safe sex. Taking low doses of aspirin every day. Taking vitamin and mineral supplements as recommended by your health care provider. What happens during an annual well check? The services and screenings done by your health care provider during your annual well check will depend on your age, overall health, lifestyle risk factors, and family history of disease. Counseling  Your health care provider may ask you questions about your: Alcohol use. Tobacco use. Drug use. Emotional well-being. Home and relationship well-being. Sexual activity. Eating habits. History of falls. Memory and ability to understand (cognition). Work and work Astronomer. Screening  You may have the following tests or measurements: Height, weight, and BMI. Blood pressure. Lipid and cholesterol levels. These may be checked every 5 years, or more frequently if you are over 30 years old. Skin check. Lung cancer screening. You may have this screening every year starting at age 72 if you have a 30-pack-year history of smoking and currently smoke or have quit within the past 15 years. Fecal occult blood test (FOBT) of the stool. You may have this test every year starting at age 72. Flexible sigmoidoscopy or colonoscopy. You may have a sigmoidoscopy every 5 years or a colonoscopy every 10 years starting at age 72. Prostate cancer screening. Recommendations will vary depending on your family history and other risks. Hepatitis C blood test. Hepatitis B blood test. Sexually transmitted disease (STD) testing. Diabetes screening. This is done by checking your blood sugar (glucose) after you have not eaten for a while (fasting). You may have  this done every 1-3 years. Abdominal aortic aneurysm (AAA) screening. You may need this if you are a current or former  smoker. Osteoporosis. You may be screened starting at age 72 if you are at high risk. Talk with your health care provider about your test results, treatment options, and if necessary, the need for more tests. Vaccines  Your health care provider may recommend certain vaccines, such as: Influenza vaccine. This is recommended every year. Tetanus, diphtheria, and acellular pertussis (Tdap, Td) vaccine. You may need a Td booster every 10 years. Zoster vaccine. You may need this after age 72. Pneumococcal 13-valent conjugate (PCV13) vaccine. One dose is recommended after age 72. Pneumococcal polysaccharide (PPSV23) vaccine. One dose is recommended after age 72. Talk to your health care provider about which screenings and vaccines you need and how often you need them. This information is not intended to replace advice given to you by your health care provider. Make sure you discuss any questions you have with your health care provider. Document Released: 10/20/2015 Document Revised: 06/12/2016 Document Reviewed: 07/25/2015 Elsevier Interactive Patient Education  2017 [employer].  Fall Prevention in the Home Falls can cause injuries. They can happen to people of all ages. There are many things you can do to make your home safe and to help prevent falls. What can I do on the outside of my home? Regularly fix the edges of walkways and driveways and fix any cracks. Remove anything that might make you trip as you walk through a door, such as a raised step or threshold. Trim any bushes or trees on the path to your home. Use bright outdoor lighting. Clear any walking paths of anything that might make someone trip, such as rocks or tools. Regularly check to see if handrails are loose or broken. Make sure that both sides of any steps have handrails. Any raised decks and porches should have guardrails on the edges. Have any leaves, snow, or ice cleared regularly. Use sand or salt on walking paths during  winter. Clean up any spills in your garage right away. This includes oil or grease spills. What can I do in the bathroom? Use night lights. Install grab bars by the toilet and in the tub and shower. Do not use towel bars as grab bars. Use non-skid mats or decals in the tub or shower. If you need to sit down in the shower, use a plastic, non-slip stool. Keep the floor dry. Clean up any water that spills on the floor as soon as it happens. Remove soap buildup in the tub or shower regularly. Attach bath mats securely with double-sided non-slip rug tape. Do not have throw rugs and other things on the floor that can make you trip. What can I do in the bedroom? Use night lights. Make sure that you have a light by your bed that is easy to reach. Do not use any sheets or blankets that are too big for your bed. They should not hang down onto the floor. Have a firm chair that has side arms. You can use this for support while you get dressed. Do not have throw rugs and other things on the floor that can make you trip. What can I do in the kitchen? Clean up any spills right away. Avoid walking on wet floors. Keep items that you use a lot in easy-to-reach places. If you need to reach something above you, use a strong step stool that has a grab bar. Keep electrical cords out  of the way. Do not use floor polish or wax that makes floors slippery. If you must use wax, use non-skid floor wax. Do not have throw rugs and other things on the floor that can make you trip. What can I do with my stairs? Do not leave any items on the stairs. Make sure that there are handrails on both sides of the stairs and use them. Fix handrails that are broken or loose. Make sure that handrails are as long as the stairways. Check any carpeting to make sure that it is firmly attached to the stairs. Fix any carpet that is loose or worn. Avoid having throw rugs at the top or bottom of the stairs. If you do have throw rugs,  attach them to the floor with carpet tape. Make sure that you have a light switch at the top of the stairs and the bottom of the stairs. If you do not have them, ask someone to add them for you. What else can I do to help prevent falls? Wear shoes that: Do not have high heels. Have rubber bottoms. Are comfortable and fit you well. Are closed at the toe. Do not wear sandals. If you use a stepladder: Make sure that it is fully opened. Do not climb a closed stepladder. Make sure that both sides of the stepladder are locked into place. Ask someone to hold it for you, if possible. Clearly mark and make sure that you can see: Any grab bars or handrails. First and last steps. Where the edge of each step is. Use tools that help you move around (mobility aids) if they are needed. These include: Canes. Walkers. Scooters. Crutches. Turn on the lights when you go into a dark area. Replace any light bulbs as soon as they burn out. Set up your furniture so you have a clear path. Avoid moving your furniture around. If any of your floors are uneven, fix them. If there are any pets around you, be aware of where they are. Review your medicines with your doctor. Some medicines can make you feel dizzy. This can increase your chance of falling. Ask your doctor what other things that you can do to help prevent falls. This information is not intended to replace advice given to you by your health care provider. Make sure you discuss any questions you have with your health care provider. Document Released: 07/20/2009 Document Revised: 02/29/2016 Document Reviewed: 10/28/2014 Elsevier Interactive Patient Education  2017 [employer].

## 2021-03-19 NOTE — Progress Notes (Signed)
Subjective:   Jeremy King is a 70 y.o. male who presents for Medicare Annual/Subsequent preventive examination.  Review of Systems     Cardiac Risk Factors include: advanced age (>1men, >72 women);dyslipidemia;family history of premature cardiovascular disease;male gender;smoking/ tobacco exposure     Objective:    Today's Vitals   03/19/21 1400 03/19/21 1408  BP:  102/60  Pulse:  68  Temp:  98.2 F (36.8 C)  TempSrc:  Temporal  SpO2:  95%  Weight:  214 lb 12.8 oz (97.4 kg)  Height:   (1.854 m)  PainSc: 0-No pain 0-No pain   Body mass index is 28.34 kg/m.  Advanced Directives 03/19/2021 04/07/2018 04/02/2018 03/12/2018 02/16/2018 11/26/2014 11/26/2014  Does Patient Have a Medical Advance Directive? Yes Yes Yes Yes No Yes Yes  Type of Advance Directive Living will;Healthcare Power of [employer] Power of [employer] Power of [employer] Power of [place];Living will - Healthcare Power of [employer] of [place];Living will  Does patient want to make changes to medical advance directive? No - Patient declined No - Patient declined - No - Patient declined - No - Patient declined -  Copy of Healthcare Power of Attorney in Chart? No - copy requested - - No - copy requested - No - copy requested -  Would patient like information on creating a medical advance directive? - - - - No - Patient declined - -    Current Medications (verified) Outpatient Encounter Medications as of 03/19/2021  Medication Sig   Ascorbic Acid (VITAMIN C) 1000 MG tablet Take 1,000 mg by mouth daily.   atorvastatin (LIPITOR) 40 MG tablet TAKE 1 TABLET(40 MG) BY MOUTH DAILY AT 6 PM   clonazePAM (KLONOPIN) 1 MG tablet TAKE 1 TABLET(1 MG) BY MOUTH TWICE DAILY.   Cyanocobalamin-Methylcobalamin (B-12 ULTRA PO) Take by mouth.   Garlic 1000 MG CAPS Take 1,000 mg by mouth daily.    Hypromellose (ARTIFICIAL TEARS OP) Place 1 drop into both eyes 3 (three) times daily as needed  (for dry eyes).    Multiple Vitamin (MULTIVITAMIN WITH MINERALS) TABS tablet Take 1 tablet by mouth daily. One-A-Day Men's   triamcinolone cream (KENALOG) 0.1 % Apply 1 application topically 2 (two) times daily. (Patient taking differently: Apply 1 application topically 2 (two) times daily as needed (for rough spots on ear).)   venlafaxine XR (EFFEXOR-XR) 150 MG 24 hr capsule TAKE 1 CAPSULE BY MOUTH EVERY MORNING WITH BREAKFAST   aspirin EC 81 MG EC tablet Take 1 tablet (81 mg total) by mouth daily. (Patient not taking: Reported on 03/19/2021)   ramelteon (ROZEREM) 8 MG tablet Take 1 tablet (8 mg total) by mouth at bedtime. (Patient not taking: Reported on 03/19/2021)   traZODone (DESYREL) 100 MG tablet TAKE 2 TABLETS(200 MG) BY MOUTH AT BEDTIME (Patient not taking: Reported on 03/19/2021)   No facility-administered encounter medications on file as of 03/19/2021.    Allergies (verified) Sertraline hcl   History: Past Medical History:  Diagnosis Date   Anxiety    Back ache    Coronary artery disease    History of kidney stones    Hypotension    Kidney stones    Paroxysmal atrial fibrillation (HCC)    chads2vasc score is at least 1   Syncope    due to hypotensin/ complicated by RVR with afib   Past Surgical History:  Procedure Laterality Date   CARDIAC CATHETERIZATION     CARDIOVERSION N/A 12/30/2014   Procedure:  CARDIOVERSION;  Surgeon: Chrystie Nose, MD;  Location: [facility] ENDOSCOPY;  Service: Cardiovascular;  Laterality: N/A;   CARDIOVERSION N/A 02/16/2018   Procedure: CARDIOVERSION;  Surgeon: Chrystie Nose, MD;  Location: [facility] ENDOSCOPY;  Service: Cardiovascular;  Laterality: N/A;   CLIPPING OF ATRIAL APPENDAGE Left 04/06/2018   Procedure: CLIPPING OF ATRIAL APPENDAGE USING ATRICURE ATRICLIP ACHV40;  Surgeon: Kerin Perna, MD;  Location: [facility] OR;  Service: Open Heart Surgery;  Laterality: Left;   CORONARY ARTERY BYPASS GRAFT N/A 04/06/2018   Procedure: CORONARY ARTERY BYPASS GRAFTING  (CABG) x 4 WITH ENDOSCOPIC HARVESTING OF RIGHT SAPHENOUS VEIN;  Surgeon: Kerin Perna, MD;  Location[facility] OR;  Service: Open Heart Surgery;  Laterality: N/A;   LEFT HEART CATH AND CORONARY ANGIOGRAPHY N/A 03/12/2018   Procedure: LEFT HEART CATH AND CORONARY ANGIOGRAPHY;  Surgeon: [name], MD;  Location: [facility] INVASIVE CV LAB;  Service: Cardiovascular;  Laterality: N/A;   LITHOTRIPSY     MAZE N/A 04/06/2018   Procedure: MAZE;  Surgeon: Kerin Perna, MD;  Location: [facility] OR;  Service: Open Heart Surgery;  Laterality: N/A;   RIGHT ROTATOR CUFF     TEE WITHOUT CARDIOVERSION N/A 12/30/2014   Procedure: TRANSESOPHAGEAL ECHOCARDIOGRAM (TEE);  Surgeon: Chrystie Nose, MD;  Location[facility] ENDOSCOPY;  Service: Cardiovascular;  Laterality: N/A;   TEE WITHOUT CARDIOVERSION N/A 04/06/2018   Procedure: TRANSESOPHAGEAL ECHOCARDIOGRAM (TEE);  Surgeon: Donata Clay, Theron Arista, MD;  Location: [facility] OR;  Service: Open Heart Surgery;  Laterality: N/A;   Family History  Problem Relation Age of Onset   Heart failure Mother        mitral valve disease   Diabetes Father    Heart failure Father        MI   Hypertension Father    CAD Father    Alcoholism Father    Cancer Sister    Social History   Socioeconomic History   Marital status: Divorced    Spouse name: Not on file   Number of children: 2   Years of education: 15   Highest education level: Not on file  Occupational History   Not on file  Tobacco Use   Smoking status: Every Day    Packs/day: 0.50    Pack years: 0.00    Types: Cigarettes    Last attempt to quit: 04/02/2018    Years since quitting: 2.9   Smokeless tobacco: Former   Tobacco comments:    Smokes half a pack per day.       Vaping Use   Vaping Use: Never used  Substance and Sexual Activity   Alcohol use: No    Alcohol/week: 0.0 standard drinks   Drug use: No   Sexual activity: Not on file  Other Topics Concern   Not on file  Social History Narrative   Not on file   Social  Determinants of Health   Financial Resource Strain: Low Risk    Difficulty of Paying Living Expenses: Not hard at all  Food Insecurity: No Food Insecurity   Worried About Programme researcher, broadcasting/film/video in the Last Year: Never true   Ran Out of Food in the Last Year: Never true  Transportation Needs: No Transportation Needs   Lack of Transportation (Medical): No   Lack of Transportation (Non-Medical): No  Physical Activity: Sufficiently Active   Days of Exercise per Week: 5 days   Minutes of Exercise per Session: 30 min  Stress: No Stress Concern Present   Feeling  of Stress : Not at all  Social Connections: Moderately Integrated   Frequency of Communication with Friends and Family: More than three times a week   Frequency of Social Gatherings with Friends and Family: More than three times a week   Attends Religious Services: More than 4 times per year   Active Member of [employer]ubs or Organizations: Yes   Attends Engineer, structuralClub or Organization Meetings: More than 4 times per year   Marital Status: Never married    Tobacco Counseling Ready to quit: No Counseling given: Yes Tobacco comments: Smokes half a pack per day.     Clinical Intake:  Pre-visit preparation completed: Yes  Pain : No/denies pain Pain Score: 0-No pain     BMI - recorded: 28.34 Nutritional Status: BMI 25 -29 Overweight Nutritional Risks: None Diabetes: No  How often do you need to have someone help you when you read instructions, pamphlets, or other written materials from your doctor or pharmacy?: 1 - Never What is the last grade level you completed in school?: Bachelor Degree from [employer]  Diabetic? no  Interpreter Needed?: No  Information entered by :: Susie CassetteShenika Palyn Scrima, LPN   Activities of Daily Living In your present state of health, do you have any difficulty performing the following activities: 03/19/2021  Hearing? Y  Vision? N  Difficulty concentrating or making decisions? N  Walking or climbing  stairs? N  Dressing or bathing? N  Doing errands, shopping? N  Preparing Food and eating ? N  Using the Toilet? N  In the past six months, have you accidently leaked urine? N  Do you have problems with loss of bowel control? N  Managing your Medications? N  Managing your Finances? N  Housekeeping or managing your Housekeeping? N  Some recent data might be hidden    Patient Care Team: Myrlene Brokerrawford, Elizabeth A, MD as PCP - General (Internal Medicine) Rennis GoldenHilty, Lisette AbuKenneth C, MD as PCP - Cardiology (Cardiology) Patient, No Pcp Per (Inactive) (General Practice)  Indicate any recent Medical Services you may have received from other than [facility] providers in the past year (date may be approximate).     Assessment:   This is a routine wellness examination for Omarian.  Hearing/Vision screen Hearing Screening - Comments:: Bilateral hearing loss; wears hearing aids. Vision Screening - Comments:: Issues with seeing; wears glasses.   Dietary issues and exercise activities discussed: Current Exercise Habits: Home exercise routine, Type of exercise: walking, Time (Minutes): 30, Frequency (Times/Week): 5, Weekly Exercise (Minutes/Week): 150, Intensity: Moderate, Exercise limited by: cardiac condition(s);orthopedic condition(s)   Goals Addressed             This Visit's Progress    Patient Stated       My goal is to stay healthy, independent and active.  I will continue to eat healthy and drink water to stay hydrated.       Depression Screen PHQ 2/9 Scores 03/19/2021 03/01/2020 12/08/2018 09/15/2015  PHQ - 2 Score 0 0 0 0    Fall Risk Fall Risk  03/19/2021 03/01/2020 12/08/2018 09/15/2015  Falls in the past year? 0 0 1 No  Number falls in past yr: 0 0 0 -  Injury with Fall? 0 0 - -  Risk for fall due to : No Fall Risks - - -  Follow up Falls evaluation completed - - -    FALL RISK PREVENTION PERTAINING TO THE HOME:  Any stairs in or around the home? No  If so, are there any  without handrails? No   Home free of loose throw rugs in walkways, pet beds, electrical cords, etc? Yes  Adequate lighting in your home to reduce risk of falls? Yes   ASSISTIVE DEVICES UTILIZED TO PREVENT FALLS:  Life alert? No  Use of a cane, walker or w/c? No  Grab bars in the bathroom? Yes  Shower chair or bench in shower? No  Elevated toilet seat or a handicapped toilet? No   TIMED UP AND GO:  Was the test performed? Yes .  Length of time to ambulate 10 feet: 8 sec.   Gait steady and fast without use of assistive device  Cognitive Function: Normal cognitive status assessed by direct observation by this Nurse Health Advisor. No abnormalities found.  No flowsheet data found.         Immunizations Immunization History  Administered Date(s) Administered   Influenza, High Dose Seasonal PF 07/31/2016   Influenza,inj,Quad PF,6+ Mos 11/27/2014   Influenza-Unspecified 07/21/2017, 07/21/2018, 06/09/2020   Pneumococcal Conjugate-13 03/02/2015   Pneumococcal Polysaccharide-23 11/27/2014   Tdap 03/17/2015    TDAP status: Up to date  Flu Vaccine status: Up to date  Pneumococcal vaccine status: Up to date  Covid-19 vaccine status: Declined, Education has been provided regarding the importance of this vaccine but patient still declined. Advised may receive this vaccine at local pharmacy or Health Dept.or vaccine clinic. Aware to provide a copy of the vaccination record if obtained from local pharmacy or Health Dept. Verbalized acceptance and understanding.  Qualifies for Shingles Vaccine? Yes   Zostavax completed No   Shingrix Completed?: No.    Education has been provided regarding the importance of this vaccine. Patient has been advised to call insurance company to determine out of pocket expense if they have not yet received this vaccine. Advised may also receive vaccine at local pharmacy or Health Dept. Verbalized acceptance and understanding.  Screening Tests Health Maintenance  Topic Date Due    COVID-19 Vaccine (1) Never done   Hepatitis C Screening  Never done   Zoster Vaccines- Shingrix (1 of 2) Never done   COLONOSCOPY (Pts 45-75yrs Insurance coverage will need to be confirmed)  03/31/2016   INFLUENZA VACCINE  05/07/2021   TETANUS/TDAP  03/16/2025   PNA vac Low Risk Adult  Completed   HPV VACCINES  Aged Out    Health Maintenance  Health Maintenance Due  Topic Date Due   COVID-19 Vaccine (1) Never done   Hepatitis C Screening  Never done   Zoster Vaccines- Shingrix (1 of 2) Never done   COLONOSCOPY (Pts 45-64yrs Insurance coverage will need to be confirmed)  03/31/2016    Colorectal cancer screening: Type of screening: Colonoscopy. Completed 03/31/2006. Repeat every 10 years  Lung Cancer Screening: (Low Dose CT Chest recommended if Age 37-80 years, 30 pack-year currently smoking OR have quit w/in 15years.) does qualify.   Lung Cancer Screening Referral: no  Additional Screening:  Hepatitis C Screening: does qualify; Completed no  Vision Screening: Recommended annual ophthalmology exams for early detection of glaucoma and other disorders of the eye. Is the patient up to date with their annual eye exam?  Yes  Who is the provider or what is the name of the office in which the patient attends annual eye exams? [facility] If pt is not established with a provider, would they like to be referred to a provider to establish care? No .   Dental Screening: Recommended annual dental exams for proper oral hygiene  [employer]  Referral / Chronic Care Management: CRR required this visit?  No   CCM required this visit?  No      Plan:     I have personally reviewed and noted the following in the patient's chart:   Medical and social history Use of alcohol, tobacco or illicit drugs  Current medications and supplements including opioid prescriptions. Patient is not currently taking opioid prescriptions. Functional ability and  status Nutritional status Physical activity Advanced directives List of other physicians Hospitalizations, surgeries, and ER visits in previous 12 months Vitals Screenings to include cognitive, depression, and falls Referrals and appointments  In addition, I have reviewed and discussed with patient certain preventive protocols, quality metrics, and best practice recommendations. A written personalized care plan for preventive services as well as general preventive health recommendations were provided to patient.     Mickeal Needy, LPN   02/07/5464   Nurse Notes: Order placed for Cologuard dx: Z121.1

## 2021-03-21 ENCOUNTER — Encounter: Payer: Self-pay | Admitting: Internal Medicine

## 2021-03-26 ENCOUNTER — Encounter: Payer: Self-pay | Admitting: Internal Medicine

## 2021-03-26 ENCOUNTER — Other Ambulatory Visit: Payer: Self-pay

## 2021-03-26 ENCOUNTER — Ambulatory Visit (INDEPENDENT_AMBULATORY_CARE_PROVIDER_SITE_OTHER): Payer: Medicare Other | Admitting: Internal Medicine

## 2021-03-26 VITALS — BP 116/74 | HR 58 | Temp 98.2°F | Ht 73.0 in | Wt 213.0 lb

## 2021-03-26 DIAGNOSIS — G8929 Other chronic pain: Secondary | ICD-10-CM

## 2021-03-26 DIAGNOSIS — F5101 Primary insomnia: Secondary | ICD-10-CM | POA: Diagnosis not present

## 2021-03-26 DIAGNOSIS — Z720 Tobacco use: Secondary | ICD-10-CM

## 2021-03-26 DIAGNOSIS — I480 Paroxysmal atrial fibrillation: Secondary | ICD-10-CM

## 2021-03-26 DIAGNOSIS — Z1211 Encounter for screening for malignant neoplasm of colon: Secondary | ICD-10-CM | POA: Diagnosis not present

## 2021-03-26 DIAGNOSIS — M545 Low back pain, unspecified: Secondary | ICD-10-CM | POA: Diagnosis not present

## 2021-03-26 DIAGNOSIS — E782 Mixed hyperlipidemia: Secondary | ICD-10-CM | POA: Diagnosis not present

## 2021-03-26 DIAGNOSIS — F411 Generalized anxiety disorder: Secondary | ICD-10-CM

## 2021-03-26 DIAGNOSIS — Z0000 Encounter for general adult medical examination without abnormal findings: Secondary | ICD-10-CM

## 2021-03-26 LAB — COMPREHENSIVE METABOLIC PANEL
ALT: 32 U/L (ref 0–53)
AST: 27 U/L (ref 0–37)
Albumin: 4.6 g/dL (ref 3.5–5.2)
Alkaline Phosphatase: 60 U/L (ref 39–117)
BUN: 13 mg/dL (ref 6–23)
CO2: 26 mEq/L (ref 19–32)
Calcium: 9.6 mg/dL (ref 8.4–10.5)
Chloride: 105 mEq/L (ref 96–112)
Creatinine, Ser: 1.32 mg/dL (ref 0.40–1.50)
GFR: 54.15 mL/min — ABNORMAL LOW (ref >60.00)
Glucose, Bld: 83 mg/dL (ref 70–99)
Potassium: 4.1 mEq/L (ref 3.5–5.1)
Sodium: 139 mEq/L (ref 135–145)
Total Bilirubin: 0.9 mg/dL (ref 0.2–1.2)
Total Protein: 7.2 g/dL (ref 6.0–8.3)

## 2021-03-26 LAB — LIPID PANEL
Cholesterol: 122 mg/dL (ref 0–200)
HDL: 39.7 mg/dL (ref >39.00)
LDL Cholesterol: 55 mg/dL (ref 0–99)
NonHDL: 82.42
Total CHOL/HDL Ratio: 3
Triglycerides: 135 mg/dL (ref 0.0–149.0)
VLDL: 27 mg/dL (ref 0.0–40.0)

## 2021-03-26 LAB — CBC
HCT: 41.5 % (ref 39.0–52.0)
Hemoglobin: 14.5 g/dL (ref 13.0–17.0)
MCHC: 35 g/dL (ref 30.0–36.0)
MCV: 95.9 fl (ref 78.0–100.0)
Platelets: 193 10*3/uL (ref 150.0–400.0)
RBC: 4.33 Mil/uL (ref 4.22–5.81)
RDW: 13.2 % (ref 11.5–15.5)
WBC: 9.1 10*3/uL (ref 4.0–10.5)

## 2021-03-26 LAB — COLOGUARD: Cologuard: NEGATIVE

## 2021-03-26 MED ORDER — CYCLOBENZAPRINE HCL 10 MG PO TABS: 10.0000 mg | ORAL_TABLET | Freq: Three times a day (TID) | ORAL | 3 refills | Status: DC | PRN

## 2021-03-26 NOTE — Patient Instructions (Signed)
We will check the labs today.   We have sent in the cyclobenzaprine (flexeril) to use for the back pain.

## 2021-03-26 NOTE — Progress Notes (Signed)
   Subjective:   Patient ID: Jeremy King, male    DOB: 01/05/1951, 70 y.o.   MRN: [id number]  HPI The patient is a 70 YO man coming in for physical.   PMH, FMH, social history reviewed and updated  Review of Systems  Constitutional:  Positive for activity change. Negative for appetite change, fatigue, fever and unexpected weight change.  HENT: Negative.    Eyes: Negative.   Respiratory: Negative.  Negative for cough, chest tightness and shortness of breath.   Cardiovascular: Negative.  Negative for chest pain, palpitations and leg swelling.  Gastrointestinal:  Negative for abdominal distention, abdominal pain, constipation, diarrhea, nausea and vomiting.  Musculoskeletal:  Positive for back pain. Negative for arthralgias.  Skin: Negative.   Neurological: Negative.  Negative for syncope, weakness and numbness.  Psychiatric/Behavioral:  Positive for sleep disturbance.    Objective:  Physical Exam Constitutional:      Appearance: He is well-developed.  HENT:     Head: Normocephalic and atraumatic.  Cardiovascular:     Rate and Rhythm: Normal rate and regular rhythm.  Pulmonary:     Effort: Pulmonary effort is normal. No respiratory distress.     Breath sounds: Normal breath sounds. No wheezing or rales.  Abdominal:     General: Bowel sounds are normal. There is no distension.     Palpations: Abdomen is soft.     Tenderness: There is no abdominal tenderness. There is no rebound.  Musculoskeletal:        General: Tenderness present.     Cervical back: Normal range of motion.  Skin:    General: Skin is warm and dry.  Neurological:     Mental Status: He is alert and oriented to person, place, and time.     Coordination: Coordination normal.    Vitals:   03/26/21 1508  BP: 116/74  Pulse: (!) 58  Temp: 98.2 F (36.8 C)  TempSrc: Oral  SpO2: 97%  Weight: 213 lb (96.6 kg)  Height: 6\' 1"  (1.854 m)    This visit occurred during the SARS-CoV-2 public health emergency.   Safety protocols were in place, including screening questions prior to the visit, additional usage of staff PPE, and extensive cleaning of exam room while observing appropriate contact time as indicated for disinfecting solutions.   Assessment & Plan:

## 2021-03-27 NOTE — Assessment & Plan Note (Signed)
Sounds regular today. Is taking no medication for this currently.

## 2021-03-27 NOTE — Assessment & Plan Note (Signed)
Still smoking about 1/2 PPD and is working on reducing that amount with goal of stopping. Advised of the risk of CV disease as well as lung disease.

## 2021-03-27 NOTE — Assessment & Plan Note (Signed)
Rx flexeril which he is using from [place] for pain in back with good relief.

## 2021-03-27 NOTE — Assessment & Plan Note (Signed)
Flu shot yearly. Covid-19 declines. Pneumonia complete. Shingrix counseled due to get at pharmacy. Tetanus due 2026. Colonoscopy does yearly stool cards through [place] we do not have records. Counseled about sun safety and mole surveillance. Counseled about the dangers of distracted driving. Given 10 year screening recommendations.

## 2021-03-27 NOTE — Assessment & Plan Note (Signed)
Uses trazodone and clonazepam for sleep and doing well overall. Can refill if needed.

## 2021-03-27 NOTE — Assessment & Plan Note (Addendum)
Taking clonazepam BID for this. No overuse or progression. Allows him to be functional and will refill when due. Is also on effexor which has helped significantly with his anxiety to reduce need for clonazepam.

## 2021-03-27 NOTE — Assessment & Plan Note (Signed)
Checking lipid panel and adjust lipitor 40 mg daily as needed. 

## 2021-04-01 LAB — COLOGUARD: COLOGUARD: NEGATIVE

## 2021-04-01 LAB — EXTERNAL GENERIC LAB PROCEDURE: COLOGUARD: NEGATIVE

## 2021-04-11 ENCOUNTER — Encounter: Payer: Self-pay | Admitting: Internal Medicine

## 2021-04-15 ENCOUNTER — Other Ambulatory Visit: Payer: Self-pay | Admitting: Internal Medicine

## 2021-04-17 ENCOUNTER — Other Ambulatory Visit: Payer: Self-pay | Admitting: Internal Medicine

## 2021-04-18 ENCOUNTER — Other Ambulatory Visit: Payer: Self-pay | Admitting: Internal Medicine

## 2021-04-19 ENCOUNTER — Other Ambulatory Visit: Payer: Self-pay | Admitting: Internal Medicine

## 2021-04-19 NOTE — Telephone Encounter (Signed)
Patient states pharmacy doesn't have order

## 2021-04-28 ENCOUNTER — Other Ambulatory Visit: Payer: Self-pay | Admitting: Internal Medicine

## 2021-05-31 ENCOUNTER — Other Ambulatory Visit: Payer: Self-pay

## 2021-05-31 ENCOUNTER — Ambulatory Visit (HOSPITAL_COMMUNITY): Payer: Medicare Other | Attending: Cardiovascular Disease

## 2021-05-31 DIAGNOSIS — I5022 Chronic systolic (congestive) heart failure: Secondary | ICD-10-CM | POA: Insufficient documentation

## 2021-05-31 LAB — ECHOCARDIOGRAM COMPLETE
AR max vel: 2.66 cm2
AV Area VTI: 2.66 cm2
AV Area mean vel: 2.53 cm2
AV Mean grad: 3.5 mmHg
AV Peak grad: 6.6 mmHg
Ao pk vel: 1.29 m/s
Area-P 1/2: 2.05 cm2
S' Lateral: 3.6 cm

## 2021-05-31 MED ORDER — PERFLUTREN LIPID MICROSPHERE
1.0000 mL | INTRAVENOUS | Status: AC | PRN
  Administered 2021-05-31: 2 mL via INTRAVENOUS

## 2021-06-22 ENCOUNTER — Ambulatory Visit: Payer: Medicare Other | Admitting: Internal Medicine

## 2021-06-22 ENCOUNTER — Encounter: Payer: Self-pay | Admitting: Internal Medicine

## 2021-06-22 ENCOUNTER — Other Ambulatory Visit: Payer: Self-pay

## 2021-06-22 VITALS — BP 118/68 | HR 65 | Ht 72.0 in | Wt 218.0 lb

## 2021-06-22 DIAGNOSIS — I5022 Chronic systolic (congestive) heart failure: Secondary | ICD-10-CM | POA: Diagnosis not present

## 2021-06-22 DIAGNOSIS — I255 Ischemic cardiomyopathy: Secondary | ICD-10-CM

## 2021-06-22 DIAGNOSIS — Z951 Presence of aortocoronary bypass graft: Secondary | ICD-10-CM | POA: Diagnosis not present

## 2021-06-22 NOTE — Patient Instructions (Signed)
Medication Instructions:  Your physician recommends that you continue on your current medications as directed. Please refer to the Current Medication list given to you today.  *If you need a refill on your cardiac medications before your next appointment, please call your pharmacy*   Testing/Procedures: Your physician has requested that you have an echocardiogram. Echocardiography is a painless test that uses sound waves to create images of your heart. It provides your doctor with information about the size and shape of your heart and how well your heart's chambers and valves are working. This procedure takes approximately one hour. There are no restrictions for this procedure. -- [address] -- due March 2023   Follow-Up: At [facility], you and your health needs are our priority.  As part of our continuing mission to provide you with exceptional heart care, we have created designated Provider Care Teams.  These Care Teams include your primary Cardiologist (physician) and Advanced Practice Providers (APPs -  Physician Assistants and Nurse Practitioners) who all work together to provide you with the care you need, when you need it.  We recommend signing up for the patient portal called "MyChart".  Sign up information is provided on this After Visit Summary.  MyChart is used to connect with patients for Virtual Visits (Telemedicine).  Patients are able to view lab/test results, encounter notes, upcoming appointments, etc.  Non-urgent messages can be sent to your provider as well.   To learn more about what you can do with MyChart, go to [email or web address].    Your next appointment:   6 month(s) - after echo  The format for your next appointment:   In Person  Provider:   You may see Chrystie Nose, MD or one of the following Advanced Practice Providers on your designated Care Team:   Azalee Course, PA-C Micah Flesher, PA-C or  Judy Pimple, [place]   Other  Instructions

## 2021-06-22 NOTE — Progress Notes (Signed)
OFFICE NOTE  Chief Complaint:  Routine follow-up  Primary Care Physician: [name], MD  HPI:  Jeremy King is a pleasant 70 year old male with few medical problems. He has a history of anxiety as well as dyslipidemia. Unfortunately he's had a number of syncopal episodes and has known orthostatic hypotension. He tells me that couple years ago he was seen at the [facility] and syncopized. Blood pressure was noted to be 60 over palpation. He was hospitalized for this and treated but is had recurrent hypotension. He was previously on Flomax which was discontinued. He does have a history of kidney stones with what sounds like ureteral stenting and/or lithotripsy. He probably saw a cardiologist once at the [place] and he tells me that they did not recommend any different medications. His CHADSVASC score is 1 given his age of 23 and hypertension. He has been maintained on aspirin. Recently he was seen in [facility] after a syncopal episode. He underwent cardiac workup including echocardiogram which shows normal LV function. He was noted to be in A. fib at that time. He says that it comes and goes but is not clear whether it has become more persistent. He's never had a cardioversion before. He denies significant alcohol use but has been a long-term smoker. There also is a strong family history of coronary disease in both parents. Recently he's been having problems with his knee and is contemplating left knee surgery with Dr. Ciro Backer.  It should be noted in the office today that he became dizzy after sitting up for his EKG. Orthostatic blood pressures were taken and he had a 20 point drop in systolic blood pressure with change in position.  I saw Jeremy King back in the office today. Unfortunately his home monitor has been alarming as he's had frequent atrial fibrillation with rapid ventricular response. Heart rates have been up into the 180s. He also had a near-syncopal episode. He  unfortunately did not check his blood pressure at that time. He's been pretty much in persistent A. fib since we placed the monitor. I think this is at least contributing to his symptoms. 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 Exam is not consistent with heart failure. Blood pressure is slightly improved today at 100 systolic, however it is limited any AV nodal blocking medications. He's currently only on aspirin because of a low CHADSVASC score.  Jeremy King returns today for follow-up. He successfully cardioverted and is maintaining sinus bradycardia. He's worn a Holter monitor which demonstrates several episodes of significant bradycardia into the 30s. He's had some sinus pauses of 2-1/2-3 seconds, which appear mostly at night. Laboratory work is unremarkable and digoxin level is 1.7. The digoxin, again was started for heart rate control prior to cardioversion as his blood pressure was less than 90 systolic with significant orthostasis. I also discontinued his trazodone which seems to have significantly improved his orthostasis. Blood pressure remains low today at 98/50. He underwent a nuclear stress test which showed 2 small fixed defects which were thought to be either small areas of scar or artifact. His echo showed normal wall motion and preserved systolic function. He is not describing anginal symptoms.  Jeremy King returns today for follow-up. He has since seen Dr. Johney Frame who put him on multiecho for control of his atrial fibrillation. Overall he feels that he may be doing well with this although yesterday he thought his heart rate was irregular. From what he described it sounds like  it was regularly irregular, this may not be atrial fibrillation. He seems to be tolerating this and the Eliquis without any bleeding problems. He is come off of trazodone due to problems with orthostatic hypotension and syncope. He's not had any further episodes. He is having problems sleeping at night  though. He was on melatonin which was initially helpful but however since it has not been helpful. In the past we talked about the possibility of obstructive sleep apnea or other sleep related disorder. He was hesitant to get a sleep study but now seems to be interested in that.  09/19/2016  Jeremy King returns for follow-up. Overall he is doing well without recurrent atrial fibrillation. He is maintained on multiecho. She is EKG shows sinus rhythm with a PAC an incomplete right bundle branch block. QTC is 437 ms. He is on Eliquis and denies any significant bleeding problems. He uses melatonin for sleep at night and takes 15 mg. Is generally helps him get 5-6 hours of sleep. He denies any chest pain or worsening shortness of breath.  02/05/2018  Jeremy King was seen today in follow-up.  He says he is feeling well but unfortunately had a recent fall.  He said he was building a ramp for his kayak and somehow tripped and landed on the edge of the ramp.  He has a large area of ecchymosis over the right cheek which extends down into the right anterior neck and some suborbital ecchymosis.  He denies any pain or tenderness concerning for fracture.  He had no mental status changes or loss of consciousness.  He had no significant external bleeding.  He continues to take his Eliquis.  He says is been compliant with it and has not missed doses.  Of note, he is back in atrial fibrillation today.  He was very surprised to hear this since he is feeling well.  In the past he has been very symptomatic and has felt dizzy and passed out.  It may be because his rate is better controlled on [place] around.  This is the first episode of A. fib in the past several years.  02/18/2018  Jeremy King returns today for follow-up.  He underwent cardioversion on Monday and successfully converted back to sinus rhythm after single shock.  He says he actually felt a lot better with some more energy however yesterday felt somewhat fatigued.   He checked his blood pressure and his machine noted that he was out of rhythm.  He also felt somewhat fatigued.  Today's EKG shows she is back in A. fib at 74.  This represents breakthrough of his Multitak.  We discussed options for treatment today including washing out his Multitak and switching to an alternate antiarrhythmic medication or referral for A. fib ablation.  He would like to try medical therapy.  I mention if we were to start a 1C agent, he would need some ischemia evaluation since his last stress test was in 2016.  03/09/2018  Jeremy King returns for follow-up of stress test.  He underwent a nuclear stress test on 02/27/2018.  This showed a mildly reduced LVEF at 49%.  There was mild septal and inferior wall ischemia from the apex to base.  This was suggested as low risk however abnormal.  He denies any chest pain or worsening shortness of breath.  He does report some fatigue however this may be related to A. fib.  He noted to be have slightly more energy after converting back to sinus rhythm  for about a day and then went back into A. fib.  This is why proposed antiarrhythmic therapy.  Previously had been on Multaq for years and then broke through with recurrent AF.  The plan was to switch him to a different antiarrhythmic such as flecainide, but we needed to make sure that he had no coronary disease prior to that.  I discussed the stress test with him including the fact that it may represent a false positive.  Nonetheless a definitive coronary evaluation is warranted.  It could be also that if he has underlying coronary disease, that may be why he did not remain in normal rhythm after a successful cardioversion.  07/16/2018  Jeremy King returns for hospital follow-up.  Overall he is doing well.  He underwent four-vessel CABG as well as maze, clipping of the left atrial appendage and is back in sinus rhythm now on amiodarone.  He is also on Eliquis for anticoagulation.  He reports no significant  symptoms and said he had no real pain with the procedure.  Per Dr. Maren Beach, we are considering taking him off of amiodarone and eventually discontinuing his Eliquis.  05/24/2019  Jeremy King is seen today in follow-up.  He continues to do very well post surgery.  He is now more than 3 months after maze and left atrial appendage closure.  We previously discontinued amiodarone and discussed stopping his Eliquis.  He denies any chest pain or worsening shortness of breath.  He has been on low-dose fludrocortisone due to some hypotension.  Otherwise labs are well controlled.  His lipid profile showed LDL at goal less than 70.  EKG today shows sinus rhythm with a right bundle branch block at 86 and is unchanged.  He denies any recurrent A. Fib.  05/31/2020  Jeremy King returns today for annual follow-up.  He continues to do well now couple years after surgery.  He denies any shortness of breath or chest pain.  He has had no recurrent atrial fibrillation.  Of note he had an issue with syncope shortly after his surgery which has not recurred.  This resolved after decreasing his beta-blocker and starting low-dose fludrocortisone.  Blood pressures were not low but he was described as being orthostatic, however I did not see clear orthostatic vital signs.  LDL most recently was 53.  06/22/2021  Jeremy King returns today for follow-up.  Overall he says he feels very well.  He denies any chest pain, shortness of breath, recurrent A. fib or other symptoms.  He has come off of the fludrocortisone but his blood pressure remains low to low normal with systolics between 90 and 100.  Today's blood pressure was slightly higher.  He gets some occasional positional dizziness.  He stopped his beta-blocker because of this.  He cannot be on additional medicines for heart failure primarily due to hypotension.  Unfortunately recent echo was repeated and shows that his EF was lower at 40 to 45% and suggested an apical regional wall  motion abnormality.  The study was considered technically difficult due to poor image quality.  PMHx:  Past Medical History:  Diagnosis Date   Anxiety    Back ache    Coronary artery disease    History of kidney stones    Hypotension    Kidney stones    Paroxysmal atrial fibrillation (HCC)    chads2vasc score is at least 1   Syncope    due to hypotensin/ complicated by RVR with afib    Past  Surgical History:  Procedure Laterality Date   CARDIAC CATHETERIZATION     CARDIOVERSION N/A 12/30/2014   Procedure: CARDIOVERSION;  Surgeon: Chrystie Nose, MD;  Location: [facility] ENDOSCOPY;  Service: Cardiovascular;  Laterality: N/A;   CARDIOVERSION N/A 02/16/2018   Procedure: CARDIOVERSION;  Surgeon: Chrystie Nose, MD;  Location: [facility] ENDOSCOPY;  Service: Cardiovascular;  Laterality: N/A;   CLIPPING OF ATRIAL APPENDAGE Left 04/06/2018   Procedure: CLIPPING OF ATRIAL APPENDAGE USING ATRICURE ATRICLIP ACHV40;  Surgeon: Kerin Perna, MD;  Location: [facility] OR;  Service: Open Heart Surgery;  Laterality: Left;   CORONARY ARTERY BYPASS GRAFT N/A 04/06/2018   Procedure: CORONARY ARTERY BYPASS GRAFTING (CABG) x 4 WITH ENDOSCOPIC HARVESTING OF RIGHT SAPHENOUS VEIN;  Surgeon: Kerin Perna, MD;  Location[facility] OR;  Service: Open Heart Surgery;  Laterality: N/A;   LEFT HEART CATH AND CORONARY ANGIOGRAPHY N/A 03/12/2018   Procedure: LEFT HEART CATH AND CORONARY ANGIOGRAPHY;  Surgeon: [name], MD;  Location: [facility] INVASIVE CV LAB;  Service: Cardiovascular;  Laterality: N/A;   LITHOTRIPSY     MAZE N/A 04/06/2018   Procedure: MAZE;  Surgeon: Kerin Perna, MD;  Location: [facility] OR;  Service: Open Heart Surgery;  Laterality: N/A;   RIGHT ROTATOR CUFF     TEE WITHOUT CARDIOVERSION N/A 12/30/2014   Procedure: TRANSESOPHAGEAL ECHOCARDIOGRAM (TEE);  Surgeon: Chrystie Nose, MD;  Location: [facility] ENDOSCOPY;  Service: Cardiovascular;  Laterality: N/A;   TEE WITHOUT CARDIOVERSION N/A 04/06/2018   Procedure: TRANSESOPHAGEAL  ECHOCARDIOGRAM (TEE);  Surgeon: Donata Clay, Theron Arista, MD;  Location: [facility] OR;  Service: Open Heart Surgery;  Laterality: N/A;    FAMHx:  Family History  Problem Relation Age of Onset   Heart failure Mother        mitral valve disease   Diabetes Father    Heart failure Father        MI   Hypertension Father    CAD Father    Alcoholism Father    Cancer Sister     SOCHx:   reports that he has been smoking cigarettes. He has been smoking an average of .5 packs per day. He has quit using smokeless tobacco. He reports that he does not drink alcohol and does not use drugs.  ALLERGIES:  Allergies  Allergen Reactions   Sertraline Hcl Other (See Comments)    Extreme headaches    ROS: Pertinent items noted in HPI and remainder of comprehensive ROS otherwise negative.  HOME MEDS: Current Outpatient Medications  Medication Sig Dispense Refill   Ascorbic Acid (VITAMIN C) 1000 MG tablet Take 1,000 mg by mouth daily.     aspirin EC 81 MG EC tablet Take 1 tablet (81 mg total) by mouth daily.     atorvastatin (LIPITOR) 40 MG tablet TAKE 1 TABLET(40 MG) BY MOUTH DAILY AT 6 PM 90 tablet 3   clonazePAM (KLONOPIN) 1 MG tablet TAKE 1 TABLET(1 MG) BY MOUTH TWICE DAILY 60 tablet 5   Cyanocobalamin-Methylcobalamin (B-12 ULTRA PO) Take by mouth.     cyclobenzaprine (FLEXERIL) 10 MG tablet Take 1 tablet (10 mg total) by mouth 3 (three) times daily as needed for muscle spasms. 270 tablet 3   Garlic 1000 MG CAPS Take 1,000 mg by mouth daily.      Hypromellose (ARTIFICIAL TEARS OP) Place 1 drop into both eyes 3 (three) times daily as needed (for dry eyes).      Multiple Vitamin (MULTIVITAMIN WITH MINERALS) TABS tablet Take 1 tablet by mouth daily.  One-A-Day Men's     traZODone (DESYREL) 100 MG tablet TAKE 2 TABLETS(200 MG) BY MOUTH AT BEDTIME 180 tablet 1   venlafaxine XR (EFFEXOR-XR) 150 MG 24 hr capsule TAKE 1 CAPSULE BY MOUTH EVERY MORNING WITH BREAKFAST 90 capsule 3   No current facility-administered  medications for this visit.    LABS/IMAGING: No results found for this or any previous visit (from the past 48 hour(s)). No results found.  VITALS: BP 118/68   Pulse 65   Ht 6' (1.829 m)   Wt 218 lb (98.9 kg)   SpO2 94%   BMI 29.57 kg/m   EXAM: General appearance: alert and no distress Neck: no carotid bruit, no JVD and thyroid not enlarged, symmetric, no tenderness/mass/nodules Lungs: clear to auscultation bilaterally and Well-healed midline scar Heart: regular rate and rhythm, S1, S2 normal, no murmur, click, rub or gallop Abdomen: soft, non-tender; bowel sounds normal; no masses,  no organomegaly Extremities: extremities normal, atraumatic, no cyanosis or edema Pulses: 2+ and symmetric Skin: Skin color, texture, turgor normal. No rashes or lesions  EKG: Normal sinus rhythm, RBBB at 64, lateral infarct -personally reviewed  ASSESSMENT: Coronary artery disease status post four-vessel CABG (LIMA to LAD, SVG to PDA, SVG to ramus, SVG to diagonal)-04/06/2018, modified maze procedure and clipping of the left atrial appendage with 40 mm atrial cure clip Abnormal Myoview stress test-LVEF 49%, mild inferior ischemia (02/2018) -recent echo with LVEF 40 to 45% (05/31/2021) Recurrent atrial fibrillation -  on Multaq with recent cardioversion (CHADSVASC 2) RBBB Normal LV function by recent echocardiogram Remote syncopal episodes  PLAN: 1.   Jeremy King continues to be asymptomatic in fact he says he feels great.  It would be unlikely that his EF has declined further with any new regional wall motion abnormalities unless he had a problem with the graft given his recent surgery however he is completely asymptomatic.  He is on very minimal medications because of low blood pressure in fact was requiring fludrocortisone and could not tolerate low-dose beta-blocker.  At this point there is little room to add any heart failure medications.  We discussed the possibility of an SGLT2 inhibitor but he  was not in favor of that.  Again since he is asymptomatic or NYHA class I, we will plan on a repeat echo in about 6 months to see if his LVEF may have improved.  The study was technically difficult and I wonder if perhaps the EF was read lower than it actually is.  Plan follow-up with me at that time.  Chrystie Nose, MD, [facility], FACP  [place]  [facility]  Medical Director of the Advanced Lipid Disorders &  Cardiovascular Risk Reduction Clinic Diplomate of the American Board of Clinical Lipidology Attending Cardiologist  Direct Dial: [phone]  Fax: [phone]  Website:  [name] Tanara Turvey 06/22/2021, 2:44 PM

## 2021-07-18 ENCOUNTER — Other Ambulatory Visit: Payer: Self-pay | Admitting: Internal Medicine

## 2021-10-13 ENCOUNTER — Other Ambulatory Visit: Payer: Self-pay | Admitting: Internal Medicine

## 2021-10-16 ENCOUNTER — Other Ambulatory Visit: Payer: Self-pay | Admitting: Internal Medicine

## 2021-12-12 ENCOUNTER — Other Ambulatory Visit: Payer: Self-pay

## 2021-12-12 ENCOUNTER — Ambulatory Visit (HOSPITAL_COMMUNITY): Payer: Medicare Other | Attending: Internal Medicine

## 2021-12-12 DIAGNOSIS — I255 Ischemic cardiomyopathy: Secondary | ICD-10-CM | POA: Diagnosis not present

## 2021-12-12 LAB — ECHOCARDIOGRAM COMPLETE
Area-P 1/2: 2.69 cm2
S' Lateral: 3.95 cm

## 2021-12-21 ENCOUNTER — Telehealth: Payer: Self-pay | Admitting: Internal Medicine

## 2021-12-21 NOTE — Telephone Encounter (Signed)
Patient returning call.

## 2021-12-21 NOTE — Telephone Encounter (Signed)
Attempted to call patient, left message for patient to call back to office.   

## 2021-12-21 NOTE — Telephone Encounter (Signed)
Spoke with patient regarding wanting to quit smoking. Patient states that he would really like to start on this as he is ready to quit. Advised patient that I can forward message to Renaee Munda to see what resources are available to him. Patient would like to do this, and would like to make Verdon Cummins, NP aware as well so they can discuss further at follow up. Advised patient I would forward message over. Patient verbalized understanding.  ?

## 2021-12-21 NOTE — Telephone Encounter (Signed)
?  Per MyChart scheduling message: ? ?Would you also note on my record, that I need help in quitting smoking.  ?I am not sure if there are any programs out there to help people do that, ?but at least to make [place] aware of that fact. ?  ?Thanks so very much ?  ?Best Regards, ?Jeremy King) ?

## 2021-12-24 NOTE — Telephone Encounter (Signed)
Hi Jenna! Yes, I can help this patient. I will give him a call today. Thanks!

## 2021-12-26 ENCOUNTER — Telehealth: Payer: Self-pay

## 2021-12-26 DIAGNOSIS — Z0000 Encounter for general adult medical examination without abnormal findings: Secondary | ICD-10-CM

## 2021-12-26 NOTE — Telephone Encounter (Signed)
Called patient per request for health coaching to quit smoking. Patient is interested in quitting and requested his initial appointment be in person on 4/3 after seeing Coletta Memos. Patient has been scheduled for his initial health coaching session.  ? ?Jeremy King, [place] ?[facility] ?Care Guide, Health Coach ?[address] ?[place] [address] ?Telephone: [phone] ?Email: [email or web address] ? ?

## 2022-01-01 NOTE — Progress Notes (Signed)
? ?Cardiology Clinic Note  ? ?Patient Name: Jeremy King ?Date of Encounter: 01/07/2022 ? ?Primary Care Provider:  Myrlene King, Jeremy A, MD ?Primary Cardiologist:  Jeremy NoseKenneth C Hilty, MD ? ?Patient Profile  ?  ?Jeremy King 71 year old male presents to the clinic today for evaluation of his hypotension. ? ?Past Medical History  ?  ?Past Medical History:  ?Diagnosis Date  ? Anxiety   ? Back ache   ? Coronary artery disease   ? History of kidney stones   ? Hypotension   ? Kidney stones   ? Paroxysmal atrial fibrillation (HCC)   ? chads2vasc score is at least 1  ? Syncope   ? due to hypotensin/ complicated by RVR with afib  ? ?Past Surgical History:  ?Procedure Laterality Date  ? CARDIAC CATHETERIZATION    ? CARDIOVERSION N/King 12/30/2014  ? Procedure: CARDIOVERSION;  Surgeon: Jeremy NoseKenneth C Hilty, MD;  [facility];  Service: Cardiovascular;  Laterality: N/King;  ? CARDIOVERSION N/King 02/16/2018  ? Procedure: CARDIOVERSION;  Surgeon: Jeremy NoseHilty, Kenneth C, MD;  [facility];  Service: Cardiovascular;  Laterality: N/King;  ? CLIPPING OF ATRIAL APPENDAGE Left 04/06/2018  ? Procedure: CLIPPING OF ATRIAL APPENDAGE USING ATRICURE ATRICLIP ACHV40;  Surgeon: Jeremy King, Peter, MD;  [facility];  Service: Open Heart Surgery;  Laterality: Left;  ? CORONARY ARTERY BYPASS GRAFT N/King 04/06/2018  ? Procedure: CORONARY ARTERY BYPASS GRAFTING (CABG) x 4 WITH ENDOSCOPIC HARVESTING OF RIGHT SAPHENOUS VEIN;  Surgeon: Jeremy King, Peter, MD;  [facility];  Service: Open Heart Surgery;  Laterality: N/King;  ? LEFT HEART CATH AND CORONARY ANGIOGRAPHY N/King 03/12/2018  ? Procedure: LEFT HEART CATH AND CORONARY ANGIOGRAPHY;  Surgeon: Lyn King, Jeremy W, MD;  [facility];  Service: Cardiovascular;  Laterality: N/King;  ? LITHOTRIPSY    ? MAZE N/King 04/06/2018  ? Procedure: MAZE;  Surgeon: Jeremy King, Peter, MD;  [facility];  Service: Open Heart Surgery;  Laterality: N/King;  ? RIGHT ROTATOR CUFF    ? TEE WITHOUT CARDIOVERSION N/King 12/30/2014  ?  Procedure: TRANSESOPHAGEAL ECHOCARDIOGRAM (TEE);  Surgeon: Jeremy NoseKenneth C Hilty, MD;  [facility];  Service: Cardiovascular;  Laterality: N/King;  ? TEE WITHOUT CARDIOVERSION N/King 04/06/2018  ? Procedure: TRANSESOPHAGEAL ECHOCARDIOGRAM (TEE);  Surgeon: Jeremy King, Jeremy AristaPeter, MD;  [facility];  Service: Open Heart Surgery;  Laterality: N/King;  ? ? ?Allergies ? ?Allergies  ?Allergen Reactions  ? Sertraline Hcl Other (See Comments)  ?  Extreme headaches  ? ? ?History of Present Illness  ?  ?Jeremy King has PMH of paroxysmal atrial fibrillation, CAD, chronic systolic CHF, hyperlipidemia, anxiety, coronary artery disease status post CABG x4 7/19 LIMA-LAD, SVG-PDA, SVG-RI, SVG-DX, insomnia, tobacco abuse, and chronic bilateral lower back pain. ? ?Follow-up with Dr. Rennis GoldenHilty on 06/22/2021.  During that time he continued to do well.  He denied chest pain shortness of breath and recurrent atrial fibrillation type symptoms.  He had come off of fludrocortisone when his systolic blood pressures between 90 and 100.  His blood pressure at that time was slightly higher.  He did report occasional positional dizziness.  Due to his dizziness he stopped his beta blocking medication.  He was not King candidate for other medication for his heart failure due to hypotension.  His echocardiogram was repeated and showed an EF of 40-45% with apical regional wall motion abnormality. ? ?He contacted the nurse triage line on 12/21/2021.  He indicated that he was ready to stop smoking.  He was put in contact with Jeremy King certified health coach for assistance. ? ?He presents to the clinic today for follow-up evaluation states he feels well.  He reports that his wife passed away last year.  Her health progressively failed.  She had diabetes, hypertension, and Alzheimer's.  He has been staying physically active working on his Jeremy King which he rebuilt the motor and repainted.  He then drove the car to [place].  He has been successfully reducing his  smoking.  We reviewed his low blood pressure.  He reports that he had 1 episode February 13 while he was at the [place].  They prescribed Florinef which she did not take.  His blood pressure today is 120/72.  He denies any episodes of presyncope, syncope or dizziness with the episode.  He also has an appointment with Jeremy King today to further discuss smoking cessation.  I will give him the salty 6 diet sheet, have him maintain his physical activity, continue his current medication, and follow-up in 1 year.  I have also given his smoking cessation information. ? ?Today he denies chest pain, shortness of breath, lower extremity edema, fatigue, palpitations, melena, hematuria, hemoptysis, diaphoresis, weakness, presyncope, syncope, orthopnea, and PND. ? ? ?Home Medications  ?  ?Prior to Admission medications   ?Medication Sig Start Date End Date Taking? Authorizing Provider  ?Ascorbic Acid (VITAMIN C) 1000 MG tablet Take 1,000 mg by mouth daily.    [provider]  ?aspirin EC 81 MG EC tablet Take 1 tablet (81 mg total) by mouth daily. 04/15/18   Jeremy King, Jeremy R, PA-C  ?atorvastatin (LIPITOR) 40 MG tablet TAKE 1 TABLET(40 MG) BY MOUTH DAILY AT 6 PM 07/18/21   King, Lisette Abu, MD  ?clonazePAM (KLONOPIN) 1 MG tablet TAKE 1 TABLET(1 MG) BY MOUTH TWICE DAILY 10/16/21   Jeremy Broker, MD  ?Cyanocobalamin-Methylcobalamin (B-12 ULTRA PO) Take by mouth.    [provider]  ?cyclobenzaprine (FLEXERIL) 10 MG tablet Take 1 tablet (10 mg total) by mouth 3 (three) times daily as needed for muscle spasms. 03/26/21   Jeremy Broker, MD  ?Garlic 1000 MG CAPS Take 1,000 mg by mouth daily.     [provider]  ?Hypromellose (ARTIFICIAL TEARS OP) Place 1 drop into both eyes 3 (three) times daily as needed (for dry eyes).     [provider]  ?Multiple Vitamin (MULTIVITAMIN WITH MINERALS) TABS tablet Take 1 tablet by mouth daily. One-King-Day Men's    [provider]  ?traZODone (DESYREL)  100 MG tablet TAKE 2 TABLETS(200 MG) BY MOUTH AT BEDTIME 10/15/21   Jeremy Broker, MD  ?venlafaxine XR (EFFEXOR-XR) 150 MG 24 hr capsule TAKE 1 CAPSULE BY MOUTH EVERY MORNING WITH BREAKFAST 04/30/21   Jeremy Broker, MD  ? ? ?Family History  ?  ?Family History  ?Problem Relation Age of Onset  ? Heart failure Mother   ?     mitral valve disease  ? Diabetes Father   ? Heart failure Father   ?     MI  ? Hypertension Father   ? CAD Father   ? Alcoholism Father   ? Cancer Sister   ? ?He indicated that his mother is deceased. He indicated that his father is deceased. He indicated that two of his three sisters are alive. He indicated that his maternal grandmother is deceased. He indicated that his maternal grandfather is deceased. He indicated that his paternal grandmother is deceased. He indicated that his  paternal grandfather is deceased. ? ?Social History  ?  ?Social History  ? ?Socioeconomic History  ? Marital status: Divorced  ?  Spouse name: Not on file  ? Number of children: 2  ? Years of education: 29  ? Highest education level: Not on file  ?Occupational History  ? Not on file  ?Tobacco Use  ? Smoking status: Every Day  ?  Packs/day: 0.50  ?  Types: Cigarettes  ?  Last attempt to quit: 04/02/2018  ?  Years since quitting: 3.7  ? Smokeless tobacco: Former  ? Tobacco comments:  ?  Smokes half King pack per day.  ?     ?Vaping Use  ? Vaping Use: Never used  ?Substance and Sexual Activity  ? Alcohol use: No  ?  Alcohol/week: 0.0 standard drinks  ? Drug use: No  ? Sexual activity: Not on file  ?Other Topics Concern  ? Not on file  ?Social History Narrative  ? Not on file  ? ?Social Determinants of Health  ? ?Financial Resource Strain: Low Risk   ? Difficulty of Paying Living Expenses: Not hard at all  ?Food Insecurity: No Food Insecurity  ? Worried About Programme researcher, broadcasting/film/video in the Last Year: Never true  ? Ran Out of Food in the Last Year: Never true  ?Transportation Needs: No Transportation Needs  ? Lack of  Transportation (Medical): No  ? Lack of Transportation (Non-Medical): No  ?Physical Activity: Sufficiently Active  ? Days of Exercise per Week: 5 days  ? Minutes of Exercise per Session: 30 min  ?Stress:

## 2022-01-07 ENCOUNTER — Ambulatory Visit (INDEPENDENT_AMBULATORY_CARE_PROVIDER_SITE_OTHER): Payer: Medicare Other

## 2022-01-07 ENCOUNTER — Encounter: Payer: Self-pay | Admitting: General Practice

## 2022-01-07 ENCOUNTER — Ambulatory Visit: Payer: Medicare Other | Admitting: General Practice

## 2022-01-07 VITALS — BP 120/72 | HR 78 | Ht 72.0 in | Wt 217.8 lb

## 2022-01-07 DIAGNOSIS — Z0000 Encounter for general adult medical examination without abnormal findings: Secondary | ICD-10-CM

## 2022-01-07 DIAGNOSIS — E782 Mixed hyperlipidemia: Secondary | ICD-10-CM | POA: Diagnosis not present

## 2022-01-07 DIAGNOSIS — I255 Ischemic cardiomyopathy: Secondary | ICD-10-CM

## 2022-01-07 DIAGNOSIS — Z720 Tobacco use: Secondary | ICD-10-CM | POA: Diagnosis not present

## 2022-01-07 DIAGNOSIS — I480 Paroxysmal atrial fibrillation: Secondary | ICD-10-CM | POA: Diagnosis not present

## 2022-01-07 DIAGNOSIS — Z951 Presence of aortocoronary bypass graft: Secondary | ICD-10-CM

## 2022-01-07 DIAGNOSIS — Z87898 Personal history of other specified conditions: Secondary | ICD-10-CM | POA: Diagnosis not present

## 2022-01-07 NOTE — Progress Notes (Signed)
Appointment Outcome: Completed, Session #: Initial  ?Start time: 9:45am   End time: 10:43am Total Mins: 58 minutes ? ? ?AGREEMENTS SECTION ? ? ? ?Overall Goal(s): ?Smoking cessation - Quit Date (TBD)                                        ? ?Agreement/Action Steps:  ?Aim to smoke 10-12 cigarettes per day ?Smoke 1/2 cigarette at a time ?Practice mini quits (track time between smoking) ?Continue woodwork project and begin planning garden project to deter smoking ?Read Quit4Life booklet ? ?Progress Notes:  ?Patient reported that he smokes between 10-15 cigarettes per day depending on his activity level. Patient stated that he prefers to gradually cut down over time without nicotine patches or lozenges. Patient shared that he tried these methods before, but they did not work for him. Patient mentioned that he was successful in quitting for 8 years before which was motivated by his faith but resumed smoking after the passing of his mother. ? ?Patient currently smokes 60mm Marlboro cigarettes. Patient stated that he cut off 1/4 to 3/8 of the cigarette before smoking. Patient stated that if he is engaging in various activities he smokes less and may go 2-3 hours before smoking again as a reward. Patient expressed that he does not smoke because of stress but out of routine. Patient typically have a cigarette in the morning during breakfast. Patient mentioned that he only smokes in the kitchen. Patient does not smoke in his car or in public.  ? ?Patient stated that he is currently working on a wood project that has helped him through the winter stay busy. Patient shared that he will be working on finishing this project to keep him busy to help him reduce his smoking. Patient is planning on planting a garden this spring as a project to keep him active outdoors and to work towards possibly winning garden of the year.  ? ? ? ?Coaching Outcomes: ?Patient was provided a copy of the Welcome Letter and Code of Ethics for his  records.  ? ?Patient was given a copy of the Quit4Life booklet to read and to complete page 28 (practice mini quits) over the next two weeks. ? ?Patient will think about other ways that he can reward himself for work/being active other than smoking.  ? ?Patient will continue to complete his woodwork project. Patient will start planning his garden project to help deter smoking. ? ?Patient will aim to smoking between 10-12 cigarettes per day.  ? ?Patient will continue to cut his cigarettes to reduce how much tobacco he is smoking. Then he will smoke half of that cigarette at a time. Patient will practice mini quits as long as possible between smoking each half of cigarette.  ? ?Will discuss with patient a quit date during next session. ? ? ? ?

## 2022-01-07 NOTE — Patient Instructions (Addendum)
Medication Instructions:  ?The current medical regimen is effective;  continue present plan and medications as directed. Please refer to the Current Medication list given to you today.  ? ?*If you need a refill on your cardiac medications before your next appointment, please call your pharmacy* ? ?Lab Work:   Testing/Procedures:  ?NONE    NONE ? ?Special Instructions ?PLEASE READ AND FOLLOW SALTY 6-ATTACHED-1,800mg  daily ? ?PLEASE USE CESSATION TIPS PROVIDED  ? ?Follow-Up: ?Your next appointment:  12 month(s) In Person with Pixie Casino, MD   ? ?Please call our office 2 months in advance to schedule this appointment  :1 ? ?At [facility], you and your health needs are our priority.  As part of our continuing mission to provide you with exceptional heart care, we have created designated Provider Care Teams.  These Care Teams include your primary Cardiologist (physician) and Advanced Practice Providers (APPs -  Physician Assistants and Nurse Practitioners) who all work together to provide you with the care you need, when you need it. ? ? ? ?        6 SALTY THINGS TO AVOID     1,800MG  DAILY ? ? ? ? ? ? ? ? ? ? ?Steps to Quit Smoking ?Smoking tobacco is the leading cause of preventable death. It can affect almost every organ in the body. Smoking puts you and people around you at risk for many serious, long-lasting (chronic) diseases. Quitting smoking can be hard, but it is one of the best things that you can do for your health. It is never too late to quit. ?How do I get ready to quit? ?When you decide to quit smoking, make a plan to help you succeed. Before you quit: ?Pick a date to quit. Set a date within the next 2 weeks to give you time to prepare. ?Write down the reasons why you are quitting. Keep this list in places where you will see it often. ?Tell your family, friends, and co-workers that you are quitting. Their support is important. ?Talk with your doctor about the choices that may help you quit. ?Find  out if your health insurance will pay for these treatments. ?Know the people, places, things, and activities that make you want to smoke (triggers). Avoid them. ?What first steps can I take to quit smoking? ?Throw away all cigarettes at home, at work, and in your car. ?Throw away the things that you use when you smoke, such as ashtrays and lighters. ?Clean your car. Make sure to empty the ashtray. ?Clean your home, including curtains and carpets. ?What can I do to help me quit smoking? ?Talk with your doctor about taking medicines and seeing a counselor at the same time. You are more likely to succeed when you do both. ?If you are pregnant or breastfeeding, talk with your doctor about counseling or other ways to quit smoking. Do not take medicine to help you quit smoking unless your doctor tells you to do so. ?To quit smoking: ?Quit right away ?Quit smoking totally, instead of slowly cutting back on how much you smoke over a period of time. ?Go to counseling. You are more likely to quit if you go to counseling sessions regularly. ?Take medicine ?You may take medicines to help you quit. Some medicines need a prescription, and some you can buy over-the-counter. Some medicines may contain a drug called nicotine to replace the nicotine in cigarettes. Medicines may: ?Help you to stop having the desire to smoke (cravings). ?Help to stop  the problems that come when you stop smoking (withdrawal symptoms). ?Your doctor may ask you to use: ?Nicotine patches, gum, or lozenges. ?Nicotine inhalers or sprays. ?Non-nicotine medicine that is taken by mouth. ?Find resources ?Find resources and other ways to help you quit smoking and remain smoke-free after you quit. These resources are most helpful when you use them often. They include: ?Online chats with a Social worker. ?Phone quitlines. ?Careers information officer. ?Support groups or group counseling. ?Text messaging programs. ?Mobile phone apps. Use apps on your mobile phone or  tablet that can help you stick to your quit plan. There are many free apps for mobile phones and tablets as well as websites. Examples include Quit Guide from the [employer] and smokefree.gov ? ?What things can I do to make it easier to quit? ? ?Talk to your family and friends. Ask them to support and encourage you. ?Call a phone quitline (1-800-QUIT-NOW), reach out to support groups, or work with a Social worker. ?Ask people who smoke to not smoke around you. ?Avoid places that make you want to smoke, such as: ?Bars. ?Parties. ?Smoke-break areas at work. ?Spend time with people who do not smoke. ?Lower the stress in your life. Stress can make you want to smoke. Try these things to help your stress: ?Getting regular exercise. ?Doing deep-breathing exercises. ?Doing yoga. ?Meditating. ?Doing a body scan. To do this, close your eyes, focus on one area of your body at a time from head to toe. Notice which parts of your body are tense. Try to relax the muscles in those areas. ?How will I feel when I quit smoking? ?Day 1 to 3 weeks ?Within the first 24 hours, you may start to have some problems that come from quitting tobacco. These problems are very bad 2-3 days after you quit, but they do not often last for more than 2-3 weeks. You may get these symptoms: ?Mood swings. ?Feeling restless, nervous, angry, or annoyed. ?Trouble concentrating. ?Dizziness. ?Strong desire for high-sugar foods and nicotine. ?Weight gain. ?Trouble pooping (constipation). ?Feeling like you may vomit (nausea). ?Coughing or a sore throat. ?Changes in how the medicines that you take for other issues work in your body. ?Depression. ?Trouble sleeping (insomnia). ?Week 3 and afterward ?After the first 2-3 weeks of quitting, you may start to notice more positive results, such as: ?Better sense of smell and taste. ?Less coughing and sore throat. ?Slower heart rate. ?Lower blood pressure. ?Clearer skin. ?Better breathing. ?Fewer sick days. ?Quitting smoking can be  hard. Do not give up if you fail the first time. Some people need to try a few times before they succeed. Do your best to stick to your quit plan, and talk with your doctor if you have any questions or concerns. ?Summary ?Smoking tobacco is the leading cause of preventable death. Quitting smoking can be hard, but it is one of the best things that you can do for your health. ?When you decide to quit smoking, make a plan to help you succeed. ?Quit smoking right away, not slowly over a period of time. ?When you start quitting, seek help from your doctor, family, or friends. ?This information is not intended to replace advice given to you by your health care provider. Make sure you discuss any questions you have with your health care provider. ?Document Revised: 06/01/2021 Document Reviewed: 12/12/2018 ?Elsevier Patient Education ? 2022 [place]. ? ? ? ? ?

## 2022-01-21 ENCOUNTER — Ambulatory Visit (INDEPENDENT_AMBULATORY_CARE_PROVIDER_SITE_OTHER): Payer: Medicare Other

## 2022-01-21 DIAGNOSIS — Z0000 Encounter for general adult medical examination without abnormal findings: Secondary | ICD-10-CM

## 2022-01-21 NOTE — Progress Notes (Signed)
Appointment Outcome: Completed, Session #: 1 ?Start time: 2:05pm   End time: 2:35pm   Total Mins: 30 minutes ? ?AGREEMENTS SECTION ? ? ? ?Overall Goal(s): ?Smoking cessation - Quit Date (TBD)                                        ?  ?Agreement/Action Steps:  ?Aim to smoke 10-12 cigarettes per day ?Smoke 1/2 cigarette at a time ?Practice mini quits (track time between smoking) ?Continue woodwork project and begin planning garden project to deter smoking ?Read Quit4Life booklet ? ?Progress Notes:  ?Patient started cutting 1/2 inch off each cigarette before smoking. Patient is smoking more than 10-12 cigarettes, but not in their entirety due to cutting the cigarettes before smoking. Patient is smoking half of the cigarette at a time. Patient uses a bowl to keep a visual of the cigarette clippings and the cigarettes he smokes to remind of his progress.  ? ?Patient has been able to practice mini quits and notice that he smokes more in the morning and less in the evenings. Patient smokes less when he is busy or in the evenings. Patient used the worksheet in the Quit4Life booklet to track his smoking habits and what helped him to deter smoking. Patient noticed that watching an entire movie instead of taking a smoke break and going outside after having breakfast and coffee to work on projects have been helpful strategies to help reduce smoking. Patient has been engaged in starting his garden project and creating other woodwork projects to help him be active and deter smoking.  ? ?Patient stated that he prays for help to quit, but also is disciplining himself to try to cut down on smoking. Patient wants to be under 10 cigarettes by this summer but has not set an official quit date. Patient stated that he will start taking out the number of cigarettes that he will allow himself to smoke each day to keep a better track of him cutting down his smoking. Patient stated that he learned that being idol contributed to his smoking  and will continue to engage in various projects to distract him from smoking.  ? ?Patient mentioned that when he can cut down his smoking to 10 cigarettes or less, he may consider using the patch.  ? ?Indicators of Success and Accountability:  Patient is cutting off 1/2 inch of the cigarette before smoking it, helping to reduce how much he is smoking. ?Readiness: Patient is in the action phase of smoking cessation. ?Strengths and Supports: Patient is being supported by family. Patient is relying in his faith and disciplining himself. ?Challenges and Barriers: Patient does not foresee any challenges to implementing his action steps over the next two weeks. ? ?Coaching Outcomes: ?Discussed with patient that he has reduced his smoking by cutting off more of the cigarette than he previously was and that due to not smoking a whole cigarette that he met his smoking range for the past two weeks.  ? ?Patient will start taking out the number of cigarettes that he is going to smoke that day and put away the pack of cigarettes to have a better control over how much he is smoking and that it will force him to check himself when he is smoking a lot or finds himself wanting to smoke more than the allotment. This approach will help the patient to start reducing the number  of cigarettes that he cut and smoke per day and not just cutting the cigarettes. ? ?Patient will continue to implement his action steps as outlined below over the next two weeks.  ? ?Agreement/Action Steps:  ?Aim to smoke 10-12 cigarettes per day  ?Take out the allotted number of cigarettes each day and put away the pack ?Smoke 1/2 cigarette at a time ?Practice mini quits (track time between smoking) ?Continue woodwork project and begin planning garden project to deter smoking ?Continue to read the Quit4Life booklet ? ? ?Attempted: ?Fulfilled - Patient was able to complete the weekly agreement in full and was able to meet the challenge over the past two  weeks. ? ?

## 2022-02-04 ENCOUNTER — Ambulatory Visit (INDEPENDENT_AMBULATORY_CARE_PROVIDER_SITE_OTHER): Payer: Medicare Other

## 2022-02-04 DIAGNOSIS — Z0000 Encounter for general adult medical examination without abnormal findings: Secondary | ICD-10-CM

## 2022-02-04 NOTE — Progress Notes (Signed)
Appointment Outcome: Completed, Session #: 2 ?Start time: 2:10pm   End time: 2:55pm   Total Mins: 45 minutes ? ?AGREEMENTS SECTION ? ? ? ?Overall Goal(s): ?Smoking cessation - Quit Date (Summer 2023)                                                ? ?Agreement/Action Steps:  ?Aim to smoke 10-12 cigarettes per day  ?Take out the allotted number of cigarettes each day and put away the pack ?Smoke 1/2 cigarette at a time ?Practice mini quits (track time between smoking) ?Continue woodwork project and begin planning garden project to deter smoking ?Continue to read the Quit4Life booklet ? ?Progress Notes:  ?Patient reported that he is lighting more than 10-12 cigarettes on some days. Patient forgot to bring his record of how many cigarettes he smoked daily. Patient stated that he has been cutting off approximately 5/8 inches of the cigarette to reduce the amount of nicotine that he consumes. Patient is not finishing the rest of the cut cigarette. Patient shared that he has had a hard time in the past two weeks with the grief of the passing of his wife.  ? ?Patient stated that his urge to smoke is worse in the morning. Patient stated that he typically prays, eat breakfast, smoke, then have his devotion time for an hour and then he will have the urge to smoke again. ? ?Patient stated that he has been practicing mini quits. Patient stated that he normally goes 1-2 hours at the minimum between smoking. Patient mentioned that he has been able practice mini quits longer by finishing his woodwork projects. Patient shared that he has started on another [employer]. Patient stated that he started his garden project, but the rain is not permitting him to plant flowers.  ? ?Patient stated that to help cut down on smoking he has switched his brand of cigarettes to Marlboro 72s, which are short cigarettes. Patient stated that they don't taste good, which helps him not smoke more, but when the urge arises, he still smokes.   ? ?Patient shared that he forgot to take out the target range of cigarettes each day and put the pack away to discipline himself from being tempted to smoke more than his target. Patient stated that this step is a great idea and will remember to put it into practice.  ? ?Patient stated that he is realizing that quitting smoking is going to be a process, but he is aiming to be smoke free by this summer. Patient stated that over the course of the next two weeks, he will aim to cut 3/4 inch off the cigarette before smoking.  ? ?Discussed with patient that by him reducing the amount of cigarette that he is smoking at a time by cutting them, he is minimizing how many cigarettes he is smoking per day because he is not smoking an entire cigarette at once or half a cigarette at a time.  ? ? ?Indicators of Success and Accountability:  Patient has switched brands of cigarettes to help deter his smoking and cutting off 5/8 cigarette prior to smoking to reduce how much he smokes at a time.  ?Readiness: Patient is in the action phase of smoking cessation. ?Strengths and Supports: Patient is being supported by his family. Patient is relying on his faith and self-discipline to work  towards his goal.  ?Challenges and Barriers: Patient not being able to get outside to work on projects can be a challenge to decreasing his smoking.  ? ? ?Coaching Outcomes: ?Patient will begin to work towards cutting 3/4 inch off each cigarette before smoking. Patient will start taking out the target number of cigarettes per day to control how may cigarettes he smokes. ? ?Will discuss with patient during next session on practicing a mini quit in the morning to lengthen the time before he has his first cigarette of the day. ? ?Patient will implement action steps as outlined below over the next two weeks.  ? ?Agreement/Action Steps:  ?Aim to smoke 10-12 cigarettes per day  ?Take out the allotted number of cigarettes each day and put away the pack ?Cut  of 5/8 to 3/4 inch cigarette before smoking ?Practice mini quits ?Track time between smoking ?Continue woodwork project and begin planning garden project to deter smoking ? ? ? ?Attempted: ?Fulfilled - Patient has been able to smoke 1/2 cigarette or less at a time. Patient is practicing mini quits. Patient continues to engage in his woodwork projects and has begun work on his garden project. Patient has read more through the [employer]. ?Partial - Patient smoked more than 10-12 cigarettes per day on a few occasions.  ?Not met - Patient did not take out the allotted number of cigarettes each day and put away the pack. ? ? ? ?

## 2022-02-18 ENCOUNTER — Ambulatory Visit (INDEPENDENT_AMBULATORY_CARE_PROVIDER_SITE_OTHER): Payer: Medicare Other

## 2022-02-18 DIAGNOSIS — Z0000 Encounter for general adult medical examination without abnormal findings: Secondary | ICD-10-CM

## 2022-02-21 NOTE — Progress Notes (Signed)
Appointment Outcome: Completed, Session #: 3 Start time: 2:10pm   End time: 2:55pm   Total Mins: 45 minutes  AGREEMENTS SECTION   Overall Goal(s): Smoking cessation - Quit Date (Tentative Summer 2023)                                               Agreement/Action Steps:  Aim to smoke 10-12 cigarettes per day  Take out the allotted number of cigarettes each day and put away the pack Cut off 5/8 to 3/4 inch cigarette before smoking Practice mini quits Track time between smoking Continue woodwork project and begin planning garden project to deter smoking  Progress Notes:  Patient stated that he is cutting each white part of the cigarette in half or a little more than half. Patient reported that he does not smoke the remaining cigarette down to the filter. Patient mentioned that he is lighting 14-15 cigarettes on some occasions rather than 10-12 cigarettes each day. Patient stated that he is aiming to cut down 1-2 cigarettes over the next two weeks, while continuing to cut off more of the cigarette before smoking.  Patient has been practicing mini quits and realized that the shortest time that he can go between smoking cigarettes is in the morning. Patient can prolong smoking when he is busy outdoors working on his wood projects. Patient stated that he does not smoke when he wakes up before he engages in his morning routine. Patient stated that it may be 1-1.5 hour before he smokes his first cigarette. Patient shared that he smokes from two cigarettes when he drinks about 1/2 cup of coffee after this time has passed. Patient stated that he then engages in devotional for 1 hour before smoking another cigarette then going outside.   Patient stated that he smokes more when he is on the phone with his sister while he is sitting in the kitchen. Patient stated that his sister is his support system, and she asks him how he is coming along with his progress. Asked patient how he could cut down on his  smoking when on the phone since the kitchen. Patient stated that he can go in the living room or outside on the porch where he doesn't smoke and is away from the cigarettes.  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 Patient stated that when he is working on his wood projects, he can go two hours without smoking. Patient has not bene able to make progress with his garden due to issues with drainage. However, patient is hopeful that after the gutters are cleaned and an inspection is completed, he will be able to start adding his soil, mulch, and plants. Patient mentioned that he can watch a movie for two hours and get on YouTube before bringing the dog in for the evening before smoking another cigarette.   Patient stated that he uses a bowl to put his cigarette clippings and butts in. Patient shared that this bowl serves as a visual to track his progress with reducing his smoking and uses it as a motivator. Patient stated that he is also motivated to quit because it is God's will. Patient stated that he is proud of the progress that he has made over the past two weeks.   Indicators of Success and Accountability:  Patient has continued to reduce how much  nicotine he is smoking by cutting each cigarette before smoking and not smoking the remaining cigarette completely to the filter.  Readiness: Patient is in the action phase of smoking cessation.  Strengths and Supports: Patient is relying on his faith and discipline to work towards smoking cessation. Challenges and Barriers: Patient does not foresee any challenges to implementing his action steps over the next two weeks.   Coaching Outcomes: After calculating the amount of cigarette that he cut off each cigarette, the patient realized that he is not smoking a total of 10-12 whole cigarettes when added up. Patient is smoking approximately 7-7.5 cigarettes per day.   Patient will continue to implement his action steps as outlined  above while trying to reduce the number of cigarettes he light and increasing the amount of the cigarette that he is cutting off before smoking.   Attempted: Fulfilled - Patient completed the bi-weekly agreement in full and was able to meet the challenge.

## 2022-03-05 ENCOUNTER — Ambulatory Visit (INDEPENDENT_AMBULATORY_CARE_PROVIDER_SITE_OTHER): Payer: Medicare Other

## 2022-03-05 DIAGNOSIS — Z0000 Encounter for general adult medical examination without abnormal findings: Secondary | ICD-10-CM

## 2022-03-05 NOTE — Progress Notes (Signed)
Appointment Outcome: Completed, Session #: 4 Start time: 2:10pm   End time: 2:45pm   Total Mins: 35 minutes  AGREEMENTS SECTION     Overall Goal(s): Smoking cessation - Quit Date (Tentative Summer 2023)                                                Agreement/Action Steps:  Aim to smoke 10-12 cigarettes per day  Take out the allotted number of cigarettes each day and put away the pack Cut off 5/8-to-3/4-inch cigarette before smoking Practice mini quits Track time between smoking Continue woodwork project and begin planning garden project to deter smoking   Progress Notes:  Patient showed a picture of him measuring the cigarette before cutting it, which measured approximately 2 inches. Patient then showed another picture when he cut the cigarette. The patient is cutting an inch off the cigarette. Patient mentioned that he is not smoking the remainder of the cigarette down to the filter. Patient realized that he is smoking less with this approach. Patient calculated that he is smoking between 5-6 whole cigarettes.   Patient mentioned that he informed his sister that the longer they talk over the phone, the more he smokes. Patient stated that she acknowledged this issue and limits the conversation from 3-4 hours to 1-1.5 hours when they talk. Patient stated that he has also switched to sitting in the living room or on his porch to reduce the urge to smoke while talking on the phone in the kitchen where he limits his smoking to. Patient shared that reducing the time on the phone and changing locations when he talks has helped him.  Patient expressed that he is having an easier time quitting now than he did over 30 years ago. Patient stated that being able to see the amount of cigarette that he is not smoking is motivating him even more and is encouraging him through the process of smoking cessation. Patient shared that although he is wasting money by not smoking the whole cigarette, he feels that  it is worth it because it shows him what he can do and helps him stay on track.   Patient mentioned that he is noticing from reducing how much he was previously smoking is helping him clear his lungs by coughing up phlegm. Patient stated that he will work towards cutting another 1/4 inch off his cigarette over the next two weeks. Patient stated that he is going to pay attention to how many puffs that he is currently taking from each cigarette and will work on reducing that as well.   Patient reported that he has been keeping himself busy with finishing wood projects. Patient has scheduled other projects to help him practice mini quits longer and to engage in physical activity. Patient shortest time in between smoking is about 2 hours. Patient mentioned that he does want to get to a point with achieving smoking cessation where he isn't smoking as much and decides to wear the nicotine patch.   Indicators of Success and Accountability:  Patient stated that cutting more cigarette off before smoking.  Readiness: Patient is in the action phase of smoking cessation.  Strengths and Supports: Patient is motivated and hopeful. Patient is also relying on his faith.  Challenges and Barriers: Patient does not foresee any challenges with implementing his action steps over the next two  weeks.   Coaching Outcomes: Patient will implement the following steps outlined below over the next two weeks. Patient will revisit the use of nicotine patches when he feels comfortable to start wearing them.   Agreement/Action Steps:  Aim to smoke 5 or less cigarettes in total per day  Take out the allotted number of cigarettes each day and put away the pack Cut off 1 inch to 1.25 inches from cigarette before smoking Practice mini quits Track time between smoking Continue woodwork project and begin planning garden project to deter smoking  Attempted: Fulfilled - Patient completed the weekly agreement in full and was able to  meet the challenge.

## 2022-03-12 DIAGNOSIS — S61310A Laceration without foreign body of right index finger with damage to nail, initial encounter: Secondary | ICD-10-CM | POA: Diagnosis not present

## 2022-03-15 ENCOUNTER — Emergency Department
Admission: EM | Admit: 2022-03-15 | Discharge: 2022-03-15 | Disposition: A | Discharge status: Home / Self Care | Payer: Medicare Other | Source: Home / Self Care

## 2022-03-15 ENCOUNTER — Encounter: Payer: Self-pay | Admitting: Emergency Medicine

## 2022-03-15 DIAGNOSIS — S61310D Laceration without foreign body of right index finger with damage to nail, subsequent encounter: Secondary | ICD-10-CM | POA: Diagnosis not present

## 2022-03-15 MED ORDER — MUPIROCIN 2 % EX OINT: 1.0000 "application " | TOPICAL_OINTMENT | Freq: Three times a day (TID) | CUTANEOUS | 0 refills | Status: AC

## 2022-03-15 NOTE — ED Provider Notes (Signed)
[facility]    CSN: [id number] Arrival date & time: 03/15/22  1311      History   Chief Complaint Chief Complaint  Patient presents with   Finger Injury    HPI Kasai Browder Bungert is a 71 y.o. male.   Pleasant 71yo male presents for an evaluation of his right index finger. Tuesday was working with a table saw, and instead of using a "pusher", he used his hand. The saw sliced the lateral aspect of his finger completely off. Pt stopped the bleeding at home with silver nitrate sticks that he had at his residence. Pt was seen at a different urgent care that evening. He had a xray confirming that the distal phalanx was completely intact. Pt personally applied two butterfly strips. He has full sensation. He has FROM. He denies any warmth, drainage or pain. He came today for a re-evaluation to make sure everything was okay. He states he was given no wound care advice at the other UC. He has only changed his bandage once. He is wearing a finger splint. His Tdap is UTD, less than one year ago.     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   Coronary artery disease involving native coronary artery of native heart without angina pectoris    Chronic systolic HF (heart failure) (HCC)    Rotator cuff arthropathy of left shoulder 09/03/2017   Bradycardia 09/19/2016   Other fatigue 04/18/2015   Excessive daytime sleepiness 04/18/2015   PAF (paroxysmal atrial fibrillation) ([place])  12/29/2014   Hyperlipidemia 04/21/2007   Anxiety state 04/21/2007    Past Surgical History:  Procedure Laterality Date   CARDIAC CATHETERIZATION     CARDIOVERSION N/A 12/30/2014   Procedure: CARDIOVERSION;  Surgeon: Pixie Casino, MD;  Location: [place];  Service: Cardiovascular;  Laterality: N/A;   CARDIOVERSION N/A 02/16/2018   Procedure: CARDIOVERSION;  Surgeon: Pixie Casino, MD;  Location: [facility] ENDOSCOPY;  Service: Cardiovascular;  Laterality: N/A;   CLIPPING OF ATRIAL APPENDAGE Left 04/06/2018   Procedure: CLIPPING OF ATRIAL APPENDAGE USING ATRICURE ATRICLIP ACHV40;  Surgeon: Ivin Poot, MD;  Location: [place];  Service: Open Heart Surgery;  Laterality: Left;   CORONARY ARTERY BYPASS GRAFT N/A 04/06/2018   Procedure: CORONARY ARTERY BYPASS GRAFTING (CABG) x 4 WITH ENDOSCOPIC HARVESTING OF RIGHT SAPHENOUS VEIN;  Surgeon: Ivin Poot, MD;  Location: [place];  Service: Open Heart Surgery;  Laterality: N/A;   LEFT HEART CATH AND CORONARY ANGIOGRAPHY N/A 03/12/2018   Procedure: LEFT HEART CATH AND CORONARY ANGIOGRAPHY;  Surgeon: Belva Crome, MD;  Location: [place] [facility];  Service: Cardiovascular;  Laterality: N/A;   LITHOTRIPSY     MAZE N/A 04/06/2018   Procedure: MAZE;  Surgeon: Ivin Poot, MD;  Location: [place];  Service: Open Heart Surgery;  Laterality: N/A;   RIGHT ROTATOR  CUFF     TEE WITHOUT CARDIOVERSION N/A 12/30/2014   Procedure: TRANSESOPHAGEAL ECHOCARDIOGRAM (TEE);  Surgeon: Pixie Casino, MD;  Location[facility] ENDOSCOPY;  Service: Cardiovascular;  Laterality: N/A;   TEE WITHOUT CARDIOVERSION N/A 04/06/2018   Procedure: TRANSESOPHAGEAL ECHOCARDIOGRAM (TEE);  Surgeon: Prescott Gum, Collier Salina, MD;  Location: [place];  Service: Open Heart Surgery;  Laterality: N/A;       Home Medications    Prior to Admission medications   Medication Sig Start Date End Date Taking? Authorizing Provider  Ascorbic Acid (VITAMIN C) 1000 MG tablet Take 1,000 mg by mouth daily.   Yes  [provider]  aspirin EC 81 MG EC tablet Take 1 tablet (81 mg total) by mouth daily. 04/15/18  Yes Barrett, Erin R, PA-C  atorvastatin (LIPITOR) 40 MG tablet TAKE 1 TABLET(40 MG) BY MOUTH DAILY AT 6 PM 07/18/21  Yes Hilty, Nadean Corwin, MD  clonazePAM (KLONOPIN) 1 MG tablet TAKE 1 TABLET(1 MG) BY MOUTH TWICE DAILY 10/16/21  Yes Hoyt Koch, MD  Cyanocobalamin-Methylcobalamin (B-12 ULTRA PO) Take by mouth.   Yes [provider]  cyclobenzaprine (FLEXERIL) 10 MG tablet Take 1 tablet (10 mg total) by mouth 3 (three) times daily as needed for muscle spasms. 03/26/21  Yes Hoyt Koch, MD  Garlic [id number] MG CAPS Take 1,000 mg by mouth daily.    Yes [provider]  Hypromellose (ARTIFICIAL TEARS OP) Place 1 drop into both eyes 3 (three) times daily as needed (for dry eyes).    Yes [provider]  Multiple Vitamin (MULTIVITAMIN WITH MINERALS) TABS tablet Take 1 tablet by mouth daily. One-A-Day Men's   Yes [provider]  mupirocin ointment (BACTROBAN) 2 % Apply 1 application  topically 3 (three) times daily. 03/15/22  Yes Dreyton Roessner L, PA  traZODone (DESYREL) 100 MG tablet TAKE 2 TABLETS(200 MG) BY MOUTH AT BEDTIME 10/15/21  Yes Hoyt Koch, MD  venlafaxine XR (EFFEXOR-XR) 150 MG 24 hr capsule TAKE 1 CAPSULE BY MOUTH EVERY MORNING WITH BREAKFAST 04/30/21  Yes Hoyt Koch, MD    Family History Family History  Problem Relation Age of Onset   Heart failure Mother        mitral valve disease   Diabetes Father    Heart failure Father        MI   Hypertension Father    CAD Father    Alcoholism Father    Cancer Sister     Social History Social History   Tobacco Use   Smoking status: Every Day    Packs/day: 0.50    Types: Cigarettes    Last attempt to quit: 04/02/2018    Years since quitting: 3.9   Smokeless tobacco: Former   Tobacco comments:    Smokes half a pack per day.       Vaping Use   Vaping Use: Never  used  Substance Use Topics   Alcohol use: No    Alcohol/week: 0.0 standard drinks of alcohol   Drug use: No     Allergies   Sertraline hcl   Review of Systems Review of Systems  Skin:  Positive for wound.  All other systems reviewed and are negative.    Physical Exam Triage Vital Signs ED Triage Vitals  Enc Vitals Group     BP 03/15/22 1331 115/70     Pulse Rate 03/15/22 1331 78     Resp 03/15/22 1331 18     Temp 03/15/22 1331 98.3 F (  36.8 C)     Temp Source 03/15/22 1331 Oral     SpO2 03/15/22 1331 95 %     Weight 03/15/22 1332 215 lb (97.5 kg)     Height 03/15/22 1332 6' (1.829 m)     Head Circumference --      Peak Flow --      Pain Score 03/15/22 1332 0     Pain Loc --      Pain Edu? --      Excl. in [employer]? --    No data found.  Updated Vital Signs BP 115/70 (BP Location: Left Arm)   Pulse 78   Temp 98.3 F (36.8 C) (Oral)   Resp 18   Ht 6' (1.829 m)   Wt 215 lb (97.5 kg)   SpO2 95%   BMI 29.16 kg/m   Visual Acuity Right Eye Distance:   Left Eye Distance:   Bilateral Distance:    Right Eye Near:   Left Eye Near:    Bilateral Near:     Physical Exam Vitals and nursing note reviewed.  Constitutional:      General: He is not in acute distress.    Appearance: Normal appearance. He is normal weight. He is not ill-appearing or toxic-appearing.  HENT:     Head: Normocephalic and atraumatic.  Musculoskeletal:        General: Signs of injury present. No swelling or tenderness.     Right hand: Deformity and laceration present. No swelling, tenderness or bony tenderness. Normal range of motion. Normal sensation. Normal capillary refill. Normal pulse.     Comments: Distal lateral tip of R index finger lacerated obliquely, dark eschar from silver nitrate still present at wound edge. Loose approximation of tissue noted. No surrounding warmth, drainage or erythema. Minimal developing maceration to pad of finger. NO FB. Neurovascularly intact.  Neurological:      Mental Status: He is alert.      UC Treatments / Results  Labs (all labs ordered are listed, but only abnormal results are displayed) Labs Reviewed - No data to display  EKG   Radiology No results found.  Procedures Procedures (including critical care time)  Medications Ordered in UC Medications - No data to display  Initial Impression / Assessment and Plan / UC Course  I have reviewed the triage vital signs and the nursing notes.  Pertinent labs & imaging results that were available during my care of the patient were reviewed by me and considered in my medical decision making (see chart for details).     R index finger laceration - appears to be healing well without any clinical sequelae. I did recommend pt start leaving the wound open for at least 8-12 hours daily to prevent maceration and skin breakdown. Topical mupirocin prescribed. Re-wrapped laceration, finger splint use encouraged. Wound care instructions discussed and printed. F/U with PCP in 1 week. RTC precautions discussed  Final Clinical Impressions(s) / UC Diagnoses   Final diagnoses:  Laceration of right index finger without foreign body with damage to nail, subsequent encounter     Discharge Instructions      This appears to be healing well. Please apply mupirocen ointment 2-3x/ day for the next week. Keep it closed when out and about, open while at home or sleeping. Signs of infection are redness, swelling, drainage or pain. Wear the splint to prevent repeat injury. Follow up with PCP in 1 week for a finger recheck.   ED Prescriptions  Medication Sig Dispense Auth. Provider   mupirocin ointment (BACTROBAN) 2 % Apply 1 application  topically 3 (three) times daily. 22 g Antonia Jicha L, [place]      PDMP not reviewed this encounter.   Chaney Malling, [place] 03/15/22 1901

## 2022-03-15 NOTE — ED Triage Notes (Signed)
Patient states that he cut the tip of his right index finger off on Tuesday.  Seen at an UC, area was assessed, dressed and a splint placed on his finger.  Patient would like to be revaluated.

## 2022-03-15 NOTE — Discharge Instructions (Addendum)
This appears to be healing well. Please apply mupirocen ointment 2-3x/ day for the next week. Keep it closed when out and about, open while at home or sleeping. Signs of infection are redness, swelling, drainage or pain. Wear the splint to prevent repeat injury. Follow up with PCP in 1 week for a finger recheck.

## 2022-03-19 ENCOUNTER — Ambulatory Visit (INDEPENDENT_AMBULATORY_CARE_PROVIDER_SITE_OTHER): Payer: Medicare Other

## 2022-03-19 DIAGNOSIS — Z0000 Encounter for general adult medical examination without abnormal findings: Secondary | ICD-10-CM

## 2022-03-19 NOTE — Progress Notes (Signed)
Appointment Outcome: Completed, Session #: 5 Start time: 2:16pm   End time: 2:44pm   Total Mins: 28 minutes   AGREEMENTS SECTION   Overall Goal(s): Smoking cessation - Quit Date (Tentative Summer 2023)               Agreement/Action Steps:  Aim to smoke 5 or less cigarettes in total per day  Take out the allotted number of cigarettes each day and put away the pack Cut off 1 inch to 1.25 inches from cigarette before smoking Practice mini quits Track time between smoking Continue woodwork project and begin planning garden project to deter smoking    Progress Notes:  Patient stated that he has been smoking more due to injuring a finger on his right hand while working on a [employer]. Patient shared that the increase of smoking is due to being bored and sitting in the house. Patient stated that he has being trying to sit outside on his porch or visit a neighbor while sitting outside to avoid smoking because he smokes in his kitchen only.   Patient reported that he continues to cut off 1-inch of each cigarette before smoking. Patient stated that he is lighting on average 15 cigarettes per day after cutting them, resulting in him smoking approximately 7.5 cigarettes daily on some occasions since his injury on 6/9. Patient was able to adhere to smoking approximately 5 cigarettes in total prior to the injury while implementing his action steps.   Patient has continued to practice mini quits and was able to extend times between smoking a short as 1.5-2 hours at a time upward to 4 hours at a time based on what he was doing. Patient mentioned that he is not able to write in his journal during devotional time, which was helpful in practicing a mini quit because he did not smoke and write. Patient stated that he continues to read during this time.  Patient expressed that he is disappointed because he injured himself and his smoking increased. Patient shared that he has engaged in negative  self-talk as a result. Discussed strategies that patient can implement in place of engaging in woodwork projects while he heals to help him deter smoking. Patient stated that he can walk to help him deter smoking and that he needs to stay moving.   Reminded patient of his action step of taking the number of allotted cigarettes for the day to help deter him from smoking more. Suggested that the patient engage in more positive self-talk that he has done previously to help encourage himself that he can stick to his steps and achieve the goal for the next two weeks.   Patient is interested in starting to use nicotine patches to assist him with smoking cessation. Patient questioned what the cost of the patches would be and if his Medicare would cover the cost.     Indicators of Success and Accountability:  Patient has been trying to find ways to distract himself to aid in practice mini quits.  Readiness: Patient is in the action phase of smoking cessation. Strengths and Supports: Patient stated that his faith is his main strength, along with being adaptable and forward-thinking. Challenges and Barriers: Patient cannot engage in his woodwork projects due to injury and will have to engage in alternate ways to distract himself from smoking while being bored.     Coaching Outcomes: Sent message to Coletta Memos and [place] regarding the patient's concern of the coverage of nicotine patches by his  Medicare and to determine what dosage he would start with based on the number of cigarettes he smokes daily.   Patient will implement the following revised action steps over the next two weeks as outlined below.   Agreement/Action Steps:  Aim to smoke 5 or less cigarettes in total per day  Take out the allotted number of cigarettes each day and put away the pack Cut off 1 inch to 1.25 inches from cigarette before smoking Practice mini quits Track time between smoking Implement positive self-talk Sit on  porch or visit neighbor to deter smoking Walk twice daily for 15-20 minutes   Attempted: Fulfilled - Patient was engaged in his woodwork projects over the past two weeks. Patient is cutting off 1 inch from each cigarette before smoking, continues to practice mini quits and tracking time between cigarettes.  Partial - Patient smoked on occasions 7.5 cigarettes per day instead of 5 or less.  Not met - Patient did not take out the number of allotted cigarettes per day.

## 2022-03-27 ENCOUNTER — Telehealth: Payer: Self-pay

## 2022-03-27 NOTE — Telephone Encounter (Signed)
Spoke with Renaee Munda she will call pt and have him call the 1800-Quit now for patches to obtain their free patches program. She will have him call back with result of this call.

## 2022-03-27 NOTE — Telephone Encounter (Signed)
-----   Message from Ronney Asters, NP sent at 03/22/2022  9:42 AM EDT ----- Regarding: RE: Cost of Nicotine Patches per his Medicare Coverage Marcelino Duster, please reach out to pharmacy to inquire about patient cost.  Thank you for your help.  Thomasene Ripple. Cleaver NP-C 03/22/2022, 9:43 AM [facility] [address] Office [phone] Fax [phone]   ----- Message ----- From: Renaee Munda Sent: 03/22/2022   9:32 AM EDT To: Ronney Asters, NP; Alyson Ingles, LPN Subject: Cost of Nicotine Patches per his Medicare Co#  Hi Verdon Cummins and [place],  I am working with this patient on smoking cessation. We are at a point where he is ready to try using the nicotine patches. He lights between 10-15 cigarettes per day after cutting off an inch of the cigarette first. Therefore, he is smoking approximately 5-7.5 whole cigarettes daily.   Patient wanted to know first if his Medicare would cover the entire cost of the patches before he makes his decision to ask for a prescription. Would either of you be able to help with that? I'm assuming he would not need the 21 mg patches, so that would make a difference.  Thanks, Amy

## 2022-03-29 ENCOUNTER — Ambulatory Visit: Payer: Medicare Other

## 2022-04-02 ENCOUNTER — Ambulatory Visit (INDEPENDENT_AMBULATORY_CARE_PROVIDER_SITE_OTHER): Payer: Medicare Other

## 2022-04-02 DIAGNOSIS — Z0000 Encounter for general adult medical examination without abnormal findings: Secondary | ICD-10-CM

## 2022-04-03 NOTE — Progress Notes (Signed)
Appointment Outcome: Completed, Session #: 6 Start time: 2:10pm   End time: 2:40pm   Total Mins: 30 minutes  AGREEMENTS SECTION   Overall Goal(s): Smoking cessation - Quit Date (August 2023)                                           Agreement/Action Steps:  Aim to smoke 5 or less cigarettes in total per day  Take out the allotted number of cigarettes each day and put away the pack Cut off 1 inch to 1.25 inches from cigarette before smoking Practice mini quits Track time between smoking Implement positive self-talk Sit on porch or visit neighbor to deter smoking Walk twice daily for 15-20 minutes    Progress Notes:  Patient stated that he is struggling with reducing his smoking. Patient shared that cutting his finger while doing woodwork was the worst thing that could have happened because he is used to working on projects for extended periods of time. Patient found that woodwork helped him practice mini quits for longer time frames. Patient mentioned that he has been able to paint and engage in his garden project, which helps distract him from smoking because he doesn't stop to take smoke breaks.   Patient stated that he has been taking out the allotted number of cigarettes per day but find himself at times taking additional cigarettes out the pack in the evening. Patient shared that the number of cigarettes that he lights per day ranges from 12-15. Patient stated that in the past few days his smoking has come down due to him being outside working on his garden project. Patient smokes on average 6-7.5 whole cigarettes in total per day since he is cutting off 1-inch from each cigarette before smoking.   Patient has spent time outdoors with neighbors sitting at the picnic table or spends time sitting on his porch to help him with increasing the time between smoking. Patient stated that he has not walked because he is used to walking with others. Patient stated that he knows at his age he  should be walking, especially since he has concerns with his heart.  Discussed with patient an alternative to getting nicotine patches due to concerns with insurance covering the cost of the patches. Patient stated that he wants to cut down some more before trying them. Patient feels that the less he smokes before going on the patch would reduce chances of withdrawal symptoms when coming off the patch. Patient stated that he will start cutting off more of the cigarette when he smokes.   Patient reflected on previous time that he was successful at quitting smoking and removing peanuts from his diet. Asked patient how he can apply the previous efforts to stop a behavior to smoking cessation again. Patient stated that he is implementing the same approach as he has previously. Patient shared that his mindset now is "I'm going to do it!" Patient stated that he also prays for guidance in doing what he needs to do so he can quit successfully.    Indicators of Success and Accountability:  Patient is finding alternative activities to engage in to help him practice mini quits and has cut back his smoking from the previous two weeks. Readiness: Patient is in the action phase of smoking cessation.  Strengths and Supports: Patient is relying on his faith and commitment to quit smoking. Challenges  and Barriers: Patient does not foresee any challenges/barriers to implementing his action steps over the next two weeks.     Coaching Outcomes: Provided patient with flyer to [place] (1-800-QUIT-NOW) as another option to acquire nicotine patches. Patient wants to reduce his smoking before trying the patches. Patient stated that he wants to be wearing the patch and have stopped smoking cigarettes by his next visit with Dr. Debara Pickett.  Patient will implement the following action steps over the next two weeks as outlined below.  Overall Goal(s): Smoking cessation - Quit Date (August 2023)                                            Agreement/Action Steps:  Aim to smoke 5 or less cigarettes in total per day  Take out 10 cigarettes per day and refrain from taking out more cigarettes Cut off 1 inch to 1.25 inches from cigarette before smoking Practice mini quits Track time between smoking Implement positive self-talk Sit on porch or visit neighbor to deter smoking Drink water when the urge arises to smoke Work on garden project  Attempted: Fulfilled - Patient continues to cut off 1-inch from each cigarette before smoking. Patient continues to practice mini quits and tracks time. Patient is implementing positive self-talk. Patient has been sitting outside or visiting neighbors to deter smoking. Partial - Patient did take out his allotted number of cigarettes per day but took out additional cigarettes on occasions.  Not met - Patient did not smoke 5 or less cigarettes in total per day. Patient has not gone for walks twice daily over the past two weeks.

## 2022-04-06 ENCOUNTER — Other Ambulatory Visit: Payer: Self-pay | Admitting: Internal Medicine

## 2022-04-16 ENCOUNTER — Ambulatory Visit (INDEPENDENT_AMBULATORY_CARE_PROVIDER_SITE_OTHER): Payer: Medicare Other

## 2022-04-16 DIAGNOSIS — Z0000 Encounter for general adult medical examination without abnormal findings: Secondary | ICD-10-CM

## 2022-04-16 NOTE — Progress Notes (Signed)
Appointment Outcome: Completed, Session #: 7 Start time: 2:00pm   End time: 2:38pm   Total Mins: 38 minutes   AGREEMENTS SECTION   Overall Goal(s): Smoking cessation - Quit Date (August 2023)                                            Agreement/Action Steps:  Aim to smoke 5 or less cigarettes in total per day  Take out 10 cigarettes per day and refrain from taking out more cigarettes Cut off 1 inch to 1.25 inches from cigarette before smoking Practice mini quits Track time between smoking Implement positive self-talk Sit on porch or visit neighbor to deter smoking Drink water when the urge arises to smoke Work on garden project  Progress Notes:  Patient stated that the number of cigarettes that he smoked per day varied. Patient reported that on some days he would smoke a total of 7.5 cigarettes by cutting 15 cigarettes in half before smoking. Patient sometimes was able to meet the goal of smoking a total of 5 cigarettes by taking out the 10 allotted cigarettes for the day and continuing to cut off half of the cigarette. Patient shared that he is happy that he has been able to get back under 10 cigarettes per day since his woodcutting accident. Patient expressed that he is feeling more confident in his ability to quit smoking.   Patient stated that he was surprised in his progress over the past two weeks because he was stuck at smoking an average of 10 whole cigarettes per day. Patient shared that the challenge to being consistent with smoking a total of 5 cigarettes per day depended on certain things that happened. Patient stated that he has learned not to make excuses for himself when he has an off day with his steps. Patient mentioned that he uses those moments to analyze his behavior and see it as a sign that he needs to work on getting his smoking back down.   Patient shared that he has continued to work on his garden project and engaged in some woodcutting for the project. Patient  expressed excitement that he won the yard/garden of the month for the first time. Patient stated that being able to get outside and engage in his project helped to take his mind off smoking and prolong practicing his mini quits. Patient stated that he also sits on his porch or go visit a neighbor on their porch to help with practicing mini quits. Patient mentioned that talking to his sister on the phone is not much of a challenge now because they limit how long their calls are and he sits on his porch for a portion of the call to get away from the kitchen where he smokes.   Patient mentioned that he has continued to engage in his morning devotionals although his remains unable to write in his journal. Patient stated that he finds himself saying scriptures and praying at time when he has urges to help him manage his thoughts about smoking. Patient shared that he has not tried drinking water when the urge arises to smoke due to drinking [employer] because he doesn't like the taste of the cigarette in his mouth after smoking. Patient stated that he will be trying to use this step moving forward along with engaging in an activity to get his mind off smoking. Patient  stated that keeping a written list of his steps in front of the toaster has help remind him of what he supposed to be doing.   Patient stated that he wants to work on cutting his smoking down to 6.5 cigarettes over the next week and reduce it by one cigarette the following week. Patient feels that he will not have much of an issue when he is ready to start using the nicotine patch. Patient mentioned that he is not having any nervous or anxious feelings due to the decrease in the amount of nicotine he is ingesting. Discuss with patient about calling 1800QUITNOW to start the process of receiving nicotine patches so that he will have them available to use when he is ready to move from smoking to using the patch. Patient agreed that was a good idea.      Indicators of Success and Accountability:  Patient has been able to reduce his smoking since experiencing an increase in the number of cigarettes after his woodcutting accident. Readiness: Patient is in the action phase of smoking cessation.  Strengths and Supports: Patient is being supported by his sister. Patient faith and confidence has increased along with adopting a positive mindset that he can become smoke free.  Challenges and Barriers: Patient does not foresee any challenges/barriers to implementing his action steps over the next two weeks.    Coaching Outcomes: Patient will call 1800QUITNOW to begin the process of getting nicotine patches to aid with smoking cessation.   Patient will continue to reduce his smoking over the next two weeks to obtain smoking no more than a total of 5 whole cigarettes by implementing his action steps as outlined above.    Attempted: Fulfilled - Patient was able to cut off 1 inch of each cigarette before smoking. Patient continues to practice mini quits and track time in between. Patient is implementing positive self-talk, sitting on his porch, or visiting neighbors, working on his garden project. Partial - Patient was able to smoke between 5-7.5 total cigarettes per day. Patient sometimes went back for additional cigarettes after taking out the allotted 10.  Not met - Patient did not drink water over the past two weeks to help manage urges to smoke.

## 2022-04-19 ENCOUNTER — Ambulatory Visit (INDEPENDENT_AMBULATORY_CARE_PROVIDER_SITE_OTHER): Payer: Medicare Other

## 2022-04-19 DIAGNOSIS — Z0000 Encounter for general adult medical examination without abnormal findings: Secondary | ICD-10-CM

## 2022-04-19 NOTE — Patient Instructions (Signed)
Jeremy King , Thank you for taking time to come for your Medicare Wellness Visit. I appreciate your ongoing commitment to your health goals. Please review the following plan we discussed and let me know if I can assist you in the future.   Screening recommendations/referrals: Colonoscopy: Cologuard 03/26/2021 Recommended yearly ophthalmology/optometry visit for glaucoma screening and checkup Recommended yearly dental visit for hygiene and checkup  Vaccinations: Influenza vaccine: completed  Pneumococcal vaccine: completed  Tdap vaccine: 03/17/2015 Shingles vaccine: will consider     Advanced directives: yes   Conditions/risks identified: none   Next appointment: none   Preventive Care 65 Years and Older, Male Preventive care refers to lifestyle choices and visits with your health care provider that can promote health and wellness. What does preventive care include? A yearly physical exam. This is also called an annual well check. Dental exams once or twice a year. Routine eye exams. Ask your health care provider how often you should have your eyes checked. Personal lifestyle choices, including: Daily care of your teeth and gums. Regular physical activity. Eating a healthy diet. Avoiding tobacco and drug use. Limiting alcohol use. Practicing safe sex. Taking low doses of aspirin every day. Taking vitamin and mineral supplements as recommended by your health care provider. What happens during an annual well check? The services and screenings done by your health care provider during your annual well check will depend on your age, overall health, lifestyle risk factors, and family history of disease. Counseling  Your health care provider may ask you questions about your: Alcohol use. Tobacco use. Drug use. Emotional well-being. Home and relationship well-being. Sexual activity. Eating habits. History of falls. Memory and ability to understand (cognition). Work and work  Astronomer. Screening  You may have the following tests or measurements: Height, weight, and BMI. Blood pressure. Lipid and cholesterol levels. These may be checked every 5 years, or more frequently if you are over 31 years old. Skin check. Lung cancer screening. You may have this screening every year starting at age 15 if you have a 30-pack-year history of smoking and currently smoke or have quit within the past 15 years. Fecal occult blood test (FOBT) of the stool. You may have this test every year starting at age 80. Flexible sigmoidoscopy or colonoscopy. You may have a sigmoidoscopy every 5 years or a colonoscopy every 10 years starting at age 86. Prostate cancer screening. Recommendations will vary depending on your family history and other risks. Hepatitis C blood test. Hepatitis B blood test. Sexually transmitted disease (STD) testing. Diabetes screening. This is done by checking your blood sugar (glucose) after you have not eaten for a while (fasting). You may have this done every 1-3 years. Abdominal aortic aneurysm (AAA) screening. You may need this if you are a current or former smoker. Osteoporosis. You may be screened starting at age 24 if you are at high risk. Talk with your health care provider about your test results, treatment options, and if necessary, the need for more tests. Vaccines  Your health care provider may recommend certain vaccines, such as: Influenza vaccine. This is recommended every year. Tetanus, diphtheria, and acellular pertussis (Tdap, Td) vaccine. You may need a Td booster every 10 years. Zoster vaccine. You may need this after age 64. Pneumococcal 13-valent conjugate (PCV13) vaccine. One dose is recommended after age 28. Pneumococcal polysaccharide (PPSV23) vaccine. One dose is recommended after age 62. Talk to your health care provider about which screenings and vaccines you need and how often  you need them. This information is not intended to replace  advice given to you by your health care provider. Make sure you discuss any questions you have with your health care provider. Document Released: 10/20/2015 Document Revised: 06/12/2016 Document Reviewed: 07/25/2015 Elsevier Interactive Patient Education  2017 [place] Prevention in the Home Falls can cause injuries. They can happen to people of all ages. There are many things you can do to make your home safe and to help prevent falls. What can I do on the outside of my home? Regularly fix the edges of walkways and driveways and fix any cracks. Remove anything that might make you trip as you walk through a door, such as a raised step or threshold. Trim any bushes or trees on the path to your home. Use bright outdoor lighting. Clear any walking paths of anything that might make someone trip, such as rocks or tools. Regularly check to see if handrails are loose or broken. Make sure that both sides of any steps have handrails. Any raised decks and porches should have guardrails on the edges. Have any leaves, snow, or ice cleared regularly. Use sand or salt on walking paths during winter. Clean up any spills in your garage right away. This includes oil or grease spills. What can I do in the bathroom? Use night lights. Install grab bars by the toilet and in the tub and shower. Do not use towel bars as grab bars. Use non-skid mats or decals in the tub or shower. If you need to sit down in the shower, use a plastic, non-slip stool. Keep the floor dry. Clean up any water that spills on the floor as soon as it happens. Remove soap buildup in the tub or shower regularly. Attach bath mats securely with double-sided non-slip rug tape. Do not have throw rugs and other things on the floor that can make you trip. What can I do in the bedroom? Use night lights. Make sure that you have a light by your bed that is easy to reach. Do not use any sheets or blankets that are too big for your bed.  They should not hang down onto the floor. Have a firm chair that has side arms. You can use this for support while you get dressed. Do not have throw rugs and other things on the floor that can make you trip. What can I do in the kitchen? Clean up any spills right away. Avoid walking on wet floors. Keep items that you use a lot in easy-to-reach places. If you need to reach something above you, use a strong step stool that has a grab bar. Keep electrical cords out of the way. Do not use floor polish or wax that makes floors slippery. If you must use wax, use non-skid floor wax. Do not have throw rugs and other things on the floor that can make you trip. What can I do with my stairs? Do not leave any items on the stairs. Make sure that there are handrails on both sides of the stairs and use them. Fix handrails that are broken or loose. Make sure that handrails are as long as the stairways. Check any carpeting to make sure that it is firmly attached to the stairs. Fix any carpet that is loose or worn. Avoid having throw rugs at the top or bottom of the stairs. If you do have throw rugs, attach them to the floor with carpet tape. Make sure that you have a light  switch at the top of the stairs and the bottom of the stairs. If you do not have them, ask someone to add them for you. What else can I do to help prevent falls? Wear shoes that: Do not have high heels. Have rubber bottoms. Are comfortable and fit you well. Are closed at the toe. Do not wear sandals. If you use a stepladder: Make sure that it is fully opened. Do not climb a closed stepladder. Make sure that both sides of the stepladder are locked into place. Ask someone to hold it for you, if possible. Clearly mark and make sure that you can see: Any grab bars or handrails. First and last steps. Where the edge of each step is. Use tools that help you move around (mobility aids) if they are needed. These  include: Canes. Walkers. Scooters. Crutches. Turn on the lights when you go into a dark area. Replace any light bulbs as soon as they burn out. Set up your furniture so you have a clear path. Avoid moving your furniture around. If any of your floors are uneven, fix them. If there are any pets around you, be aware of where they are. Review your medicines with your doctor. Some medicines can make you feel dizzy. This can increase your chance of falling. Ask your doctor what other things that you can do to help prevent falls. This information is not intended to replace advice given to you by your health care provider. Make sure you discuss any questions you have with your health care provider. Document Released: 07/20/2009 Document Revised: 02/29/2016 Document Reviewed: 10/28/2014 Elsevier Interactive Patient Education  2017 [employer].

## 2022-04-19 NOTE — Progress Notes (Signed)
Subjective:   Jeremy King is a 71 y.o. male who presents for an subsequent Medicare Annual Wellness Visit.   I connected with Hall Busing today by telephone and verified that I am speaking with the correct person using two identifiers. Location patient: home Location provider: work Persons participating in the virtual visit: patient, provider.   I discussed the limitations, risks, security and privacy concerns of performing an evaluation and management service by telephone and the availability of in person appointments. I also discussed with the patient that there may be a patient responsible charge related to this service. The patient expressed understanding and verbally consented to this telephonic visit.    Interactive audio and video telecommunications were attempted between this provider and patient, however failed, due to patient having technical difficulties OR patient did not have access to video capability.  We continued and completed visit with audio only.    Review of Systems     Cardiac Risk Factors include: advanced age (>57men, >40 women);male gender     Objective:    Today's Vitals   There is no height or weight on file to calculate BMI.     04/19/2022    2:00 PM 03/19/2021    2:42 PM 04/07/2018   12:30 PM 04/02/2018    8:05 AM 03/12/2018    5:45 AM 02/16/2018    9:09 AM 11/26/2014   11:45 PM  Advanced Directives  Does Patient Have a Medical Advance Directive? Yes Yes Yes Yes Yes No Yes  Type of Estate agent of [place];Living will Living will;Healthcare Power of [employer] Power of [employer] Power of [employer] Power of [place];Living will  Healthcare Power of Attorney  Does patient want to make changes to medical advance directive?  No - Patient declined No - Patient declined  No - Patient declined  No - Patient declined  Copy of Healthcare Power of Attorney in Chart? No - copy requested No - copy requested   No -  copy requested  No - copy requested  Would patient like information on creating a medical advance directive?      No - Patient declined     Current Medications (verified) Outpatient Encounter Medications as of 04/19/2022  Medication Sig   Ascorbic Acid (VITAMIN C) 1000 MG tablet Take 1,000 mg by mouth daily.   aspirin EC 81 MG EC tablet Take 1 tablet (81 mg total) by mouth daily.   atorvastatin (LIPITOR) 40 MG tablet TAKE 1 TABLET(40 MG) BY MOUTH DAILY AT 6 PM   clonazePAM (KLONOPIN) 1 MG tablet TAKE 1 TABLET(1 MG) BY MOUTH TWICE DAILY   Cyanocobalamin-Methylcobalamin (B-12 ULTRA PO) Take by mouth.   cyclobenzaprine (FLEXERIL) 10 MG tablet Take 1 tablet (10 mg total) by mouth 3 (three) times daily as needed for muscle spasms.   Garlic 1000 MG CAPS Take 1,000 mg by mouth daily.    Hypromellose (ARTIFICIAL TEARS OP) Place 1 drop into both eyes 3 (three) times daily as needed (for dry eyes).    Multiple Vitamin (MULTIVITAMIN WITH MINERALS) TABS tablet Take 1 tablet by mouth daily. One-A-Day Men's   mupirocin ointment (BACTROBAN) 2 % Apply 1 application  topically 3 (three) times daily.   traZODone (DESYREL) 100 MG tablet TAKE 2 TABLETS(200 MG) BY MOUTH AT BEDTIME   venlafaxine XR (EFFEXOR-XR) 150 MG 24 hr capsule TAKE 1 CAPSULE BY MOUTH EVERY MORNING WITH BREAKFAST   No facility-administered encounter medications on file as of 04/19/2022.  Allergies (verified) Sertraline hcl   History: Past Medical History:  Diagnosis Date   Anxiety    Back ache    Coronary artery disease    History of kidney stones    Hypotension    Kidney stones    Paroxysmal atrial fibrillation (HCC)    chads2vasc score is at least 1   Syncope    due to hypotensin/ complicated by RVR with afib   Past Surgical History:  Procedure Laterality Date   CARDIAC CATHETERIZATION     CARDIOVERSION N/A 12/30/2014   Procedure: CARDIOVERSION;  Surgeon: Chrystie Nose, MD;  Location: [facility] ENDOSCOPY;  Service:  Cardiovascular;  Laterality: N/A;   CARDIOVERSION N/A 02/16/2018   Procedure: CARDIOVERSION;  Surgeon: Chrystie Nose, MD;  Location: [facility] ENDOSCOPY;  Service: Cardiovascular;  Laterality: N/A;   CLIPPING OF ATRIAL APPENDAGE Left 04/06/2018   Procedure: CLIPPING OF ATRIAL APPENDAGE USING ATRICURE ATRICLIP ACHV40;  Surgeon: Kerin Perna, MD;  Location: [facility] OR;  Service: Open Heart Surgery;  Laterality: Left;   CORONARY ARTERY BYPASS GRAFT N/A 04/06/2018   Procedure: CORONARY ARTERY BYPASS GRAFTING (CABG) x 4 WITH ENDOSCOPIC HARVESTING OF RIGHT SAPHENOUS VEIN;  Surgeon: Kerin Perna, MD;  Location: [facility] OR;  Service: Open Heart Surgery;  Laterality: N/A;   LEFT HEART CATH AND CORONARY ANGIOGRAPHY N/A 03/12/2018   Procedure: LEFT HEART CATH AND CORONARY ANGIOGRAPHY;  Surgeon: [name], MD;  Location: [facility] INVASIVE CV LAB;  Service: Cardiovascular;  Laterality: N/A;   LITHOTRIPSY     MAZE N/A 04/06/2018   Procedure: MAZE;  Surgeon: Kerin Perna, MD;  Location: [facility] OR;  Service: Open Heart Surgery;  Laterality: N/A;   RIGHT ROTATOR CUFF     TEE WITHOUT CARDIOVERSION N/A 12/30/2014   Procedure: TRANSESOPHAGEAL ECHOCARDIOGRAM (TEE);  Surgeon: Chrystie Nose, MD;  Location: [facility] ENDOSCOPY;  Service: Cardiovascular;  Laterality: N/A;   TEE WITHOUT CARDIOVERSION N/A 04/06/2018   Procedure: TRANSESOPHAGEAL ECHOCARDIOGRAM (TEE);  Surgeon: Donata Clay, Theron Arista, MD;  Location: [facility] OR;  Service: Open Heart Surgery;  Laterality: N/A;   Family History  Problem Relation Age of Onset   Heart failure Mother        mitral valve disease   Diabetes Father    Heart failure Father        MI   Hypertension Father    CAD Father    Alcoholism Father    Cancer Sister    Social History   Socioeconomic History   Marital status: Divorced    Spouse name: Not on file   Number of children: 2   Years of education: 15   Highest education level: Not on file  Occupational History   Not on file  Tobacco Use   Smoking  status: Every Day    Packs/day: 0.50    Types: Cigarettes    Last attempt to quit: 04/02/2018    Years since quitting: 4.0   Smokeless tobacco: Former   Tobacco comments:    Smokes half a pack per day.       Vaping Use   Vaping Use: Never used  Substance and Sexual Activity   Alcohol use: No    Alcohol/week: 0.0 standard drinks of alcohol   Drug use: No   Sexual activity: Not on file  Other Topics Concern   Not on file  Social History Narrative   Not on file   Social Determinants of Health   Financial Resource Strain: Low Risk  (04/19/2022)  Overall Financial Resource Strain (CARDIA)    Difficulty of Paying Living Expenses: Not hard at all  Food Insecurity: No Food Insecurity (04/19/2022)   Hunger Vital Sign    Worried About Running Out of Food in the Last Year: Never true    Ran Out of Food in the Last Year: Never true  Transportation Needs: No Transportation Needs (04/19/2022)   PRAPARE - Administrator, Civil ServiceTransportation    Lack of Transportation (Medical): No    Lack of Transportation (Non-Medical): No  Physical Activity: Insufficiently Active (04/19/2022)   Exercise Vital Sign    Days of Exercise per Week: 3 days    Minutes of Exercise per Session: 30 min  Stress: No Stress Concern Present (04/19/2022)   [employer] - Occupational Stress Questionnaire    Feeling of Stress : Not at all  Social Connections: Moderately Isolated (04/19/2022)   Social Connection and Isolation Panel [NHANES]    Frequency of Communication with Friends and Family: Three times a week    Frequency of Social Gatherings with Friends and Family: Three times a week    Attends Religious Services: More than 4 times per year    Active Member of Clubs or Organizations: No    Attends BankerClub or Organization Meetings: Never    Marital Status: Divorced    Tobacco Counseling Ready to quit: Not Answered Counseling given: Not Answered Tobacco comments: Smokes half a pack per day.     Clinical  Intake:  Pre-visit preparation completed: Yes  Pain : No/denies pain     Nutritional Risks: None Diabetes: No  How often do you need to have someone help you when you read instructions, pamphlets, or other written materials from your doctor or pharmacy?: 1 - Never What is the last grade level you completed in school?: college  Diabetic?no   Interpreter Needed?: No  Information entered by :: L.Reet Scharrer,LPN   Activities of Daily Living    04/19/2022    2:03 PM  In your present state of health, do you have any difficulty performing the following activities:  Hearing? 0  Vision? 0  Difficulty concentrating or making decisions? 0  Walking or climbing stairs? 0  Dressing or bathing? 0  Doing errands, shopping? 0  Preparing Food and eating ? N  Using the Toilet? N  In the past six months, have you accidently leaked urine? N  Do you have problems with loss of bowel control? N  Managing your Medications? N  Managing your Finances? N  Housekeeping or managing your Housekeeping? N    Patient Care Team: Myrlene Brokerrawford, Elizabeth A, MD as PCP - General (Internal Medicine) Rennis GoldenHilty, Lisette AbuKenneth C, MD as PCP - Cardiology (Cardiology) Patient, No Pcp Per (General Practice) Renaee MundaLee, Amy (Cardiology)  Indicate any recent Medical Services you may have received from other than [facility] providers in the past year (date may be approximate).     Assessment:   This is a routine wellness examination for Chris.  Hearing/Vision screen Vision Screening - Comments:: Annual eye exams wears glasses   Dietary issues and exercise activities discussed: Current Exercise Habits: Home exercise routine, Type of exercise: walking, Time (Minutes): 30, Frequency (Times/Week): 3, Weekly Exercise (Minutes/Week): 90, Intensity: Mild, Exercise limited by: None identified   Goals Addressed   None    Depression Screen    04/19/2022    2:01 PM 04/19/2022    1:59 PM 03/19/2021    2:41 PM 03/01/2020    8:37 AM 12/08/2018     1:10  PM 09/15/2015    8:48 AM  PHQ 2/9 Scores  PHQ - 2 Score 0 0 0 0 0 0    Fall Risk    04/19/2022    2:01 PM 03/19/2021    2:44 PM 03/01/2020    8:15 AM 12/08/2018    1:10 PM 09/15/2015    8:48 AM  Fall Risk   Falls in the past year? 0 0 0 1 No  Number falls in past yr: 0 0 0 0   Injury with Fall? 0 0 0    Risk for fall due to :  No Fall Risks     Follow up Falls evaluation completed;Education provided Falls evaluation completed       FALL RISK PREVENTION PERTAINING TO THE HOME:  Any stairs in or around the home? No  If so, are there any without handrails? No  Home free of loose throw rugs in walkways, pet beds, electrical cords, etc? Yes  Adequate lighting in your home to reduce risk of falls? Yes   ASSISTIVE DEVICES UTILIZED TO PREVENT FALLS:  Life alert? No  Use of a cane, walker or w/c? No  Grab bars in the bathroom? Yes  Shower chair or bench in shower? No  Elevated toilet seat or a handicapped toilet? No     Cognitive Function:  Normal cognitive status assessed by telephone conversation  by this Nurse Health Advisor. No abnormalities found.        Immunizations Immunization History  Administered Date(s) Administered   Influenza, High Dose Seasonal PF 07/31/2016   Influenza,inj,Quad PF,6+ Mos 11/27/2014   Influenza-Unspecified 07/21/2017, 07/21/2018, 06/09/2020   Pneumococcal Conjugate-13 03/02/2015   Pneumococcal Polysaccharide-23 11/27/2014   Tdap 03/17/2015    TDAP status: Up to date  Flu Vaccine status: Up to date  Pneumococcal vaccine status: Up to date  Covid-19 vaccine status: Completed vaccines  Qualifies for Shingles Vaccine? Yes   Zostavax completed No   Shingrix Completed?: No.    Education has been provided regarding the importance of this vaccine. Patient has been advised to call insurance company to determine out of pocket expense if they have not yet received this vaccine. Advised may also receive vaccine at local pharmacy or Health  Dept. Verbalized acceptance and understanding.  Screening Tests Health Maintenance  Topic Date Due   COVID-19 Vaccine (1) Never done   Hepatitis C Screening  Never done   Zoster Vaccines- Shingrix (1 of 2) Never done   INFLUENZA VACCINE  05/07/2022   Fecal DNA (Cologuard)  03/26/2024   TETANUS/TDAP  03/16/2025   Pneumonia Vaccine 1+ Years old  Completed   HPV VACCINES  Aged Out   COLONOSCOPY (Pts 45-12yrs Insurance coverage will need to be confirmed)  Discontinued    Health Maintenance  Health Maintenance Due  Topic Date Due   COVID-19 Vaccine (1) Never done   Hepatitis C Screening  Never done   Zoster Vaccines- Shingrix (1 of 2) Never done    Colorectal cancer screening: Type of screening: Cologuard. Completed 03/26/2021. Repeat every 3 years  Lung Cancer Screening: (Low Dose CT Chest recommended if Age 37-80 years, 30 pack-year currently smoking OR have quit w/in 15years.) does not qualify.   Lung Cancer Screening Referral: n/a  Additional Screening:  Hepatitis C Screening: does not qualify;   Vision Screening: Recommended annual ophthalmology exams for early detection of glaucoma and other disorders of the eye. Is the patient up to date with their annual eye exam?  Yes  Who  is the provider or what is the name of the office in which the patient attends [facility] exams? [facility] If pt is not established with a provider, would they like to be referred to a provider to establish care? No .   Dental Screening: Recommended annual dental exams for proper oral hygiene  Community Resource Referral / Chronic Care Management: CRR required this visit?  No   CCM required this visit?  No      Plan:     I have personally reviewed and noted the following in the patient's chart:   Medical and social history Use of alcohol, tobacco or illicit drugs  Current medications and supplements including opioid prescriptions. Patient is not currently taking opioid  prescriptions. Functional ability and status Nutritional status Physical activity Advanced directives List of other physicians Hospitalizations, surgeries, and ER visits in previous 12 months Vitals Screenings to include cognitive, depression, and falls Referrals and appointments  In addition, I have reviewed and discussed with patient certain preventive protocols, quality metrics, and best practice recommendations. A written personalized care plan for preventive services as well as general preventive health recommendations were provided to patient.     March Rummage, LPN   4/93/2419   Nurse Notes: none

## 2022-04-25 ENCOUNTER — Other Ambulatory Visit: Payer: Self-pay | Admitting: Internal Medicine

## 2022-04-30 ENCOUNTER — Ambulatory Visit: Payer: Medicare Other

## 2022-05-03 ENCOUNTER — Telehealth: Payer: Self-pay

## 2022-05-03 DIAGNOSIS — Z0000 Encounter for general adult medical examination without abnormal findings: Secondary | ICD-10-CM

## 2022-05-03 NOTE — Telephone Encounter (Signed)
Called patient to reschedule health coaching session as requested. Left message for patient to return call to schedule appointment.    Aggie Douse Nedra Hai, [facility] Guide, Health Coach [address], [address] [place] [address] Telephone: [phone] Email: [email or web address]

## 2022-05-05 ENCOUNTER — Other Ambulatory Visit: Payer: Self-pay | Admitting: Internal Medicine

## 2022-05-07 ENCOUNTER — Other Ambulatory Visit: Payer: Self-pay | Admitting: Internal Medicine

## 2022-05-07 ENCOUNTER — Telehealth: Payer: Self-pay

## 2022-05-07 DIAGNOSIS — Z0000 Encounter for general adult medical examination without abnormal findings: Secondary | ICD-10-CM

## 2022-05-07 NOTE — Telephone Encounter (Signed)
Patient called to schedule an appt with you.

## 2022-05-07 NOTE — Telephone Encounter (Signed)
Returned patient's call to rescheduled missed health coaching appointment. Patient stated that his appointment can be scheduled for Tuesday of next week at 2:15pm as usual. Patient has been scheduled for a f/u health coaching appointment in-person on 8/8 at 2:15pm.   Renaee Munda, [facility] Guide, Health Coach [address] [place] [address] Telephone: [phone] Email: [email or web address]

## 2022-05-10 ENCOUNTER — Ambulatory Visit (INDEPENDENT_AMBULATORY_CARE_PROVIDER_SITE_OTHER): Payer: Medicare Other

## 2022-05-10 ENCOUNTER — Encounter: Payer: Self-pay | Admitting: Emergency Medicine

## 2022-05-10 ENCOUNTER — Ambulatory Visit
Admission: EM | Admit: 2022-05-10 | Discharge: 2022-05-10 | Disposition: A | Discharge status: Home / Self Care | Payer: Medicare Other | Attending: Family Medicine | Admitting: Family Medicine

## 2022-05-10 DIAGNOSIS — M778 Other enthesopathies, not elsewhere classified: Secondary | ICD-10-CM

## 2022-05-10 DIAGNOSIS — S6991XA Unspecified injury of right wrist, hand and finger(s), initial encounter: Secondary | ICD-10-CM

## 2022-05-10 DIAGNOSIS — M19041 Primary osteoarthritis, right hand: Secondary | ICD-10-CM | POA: Diagnosis not present

## 2022-05-10 DIAGNOSIS — M7989 Other specified soft tissue disorders: Secondary | ICD-10-CM | POA: Diagnosis not present

## 2022-05-10 NOTE — ED Triage Notes (Signed)
Swelling to Right index finger since injuring it in June  Denies pain  Concerned about swelling

## 2022-05-10 NOTE — ED Provider Notes (Signed)
[facility]    CSN: [id number] Arrival date & time: 05/10/22  1335      History   Chief Complaint Chief Complaint  Patient presents with   Hand Pain    Right index    HPI Jeremy King is a 71 y.o. male.   Patient states that he accidentally cut off the distal tip of his right index finger while using a table saw in June.  He wore a bandage and finger splint for several weeks and his wound healed well.  However he has had persistent mild swelling in his finger with decreased range of motion of the joints.  He denies pain.  The history is provided by the patient.    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   Coronary artery disease involving native coronary artery of native heart without angina pectoris    Chronic systolic HF (heart failure) (HCC)    Rotator cuff arthropathy of left shoulder 09/03/2017   Bradycardia 09/19/2016   Other fatigue 04/18/2015   Excessive daytime sleepiness 04/18/2015   PAF (paroxysmal atrial fibrillation) (HCC) 12/29/2014   Hyperlipidemia 04/21/2007   Anxiety state 04/21/2007    Past Surgical History:  Procedure Laterality Date   CARDIAC CATHETERIZATION     CARDIOVERSION N/A 12/30/2014   Procedure: CARDIOVERSION;  Surgeon: Chrystie Nose, MD;  Location: [facility] ENDOSCOPY;  Service: Cardiovascular;  Laterality: N/A;   CARDIOVERSION N/A 02/16/2018   Procedure: CARDIOVERSION;  Surgeon: Chrystie Nose, MD;  Location: [facility]  ENDOSCOPY;  Service: Cardiovascular;  Laterality: N/A;   CLIPPING OF ATRIAL APPENDAGE Left 04/06/2018   Procedure: CLIPPING OF ATRIAL APPENDAGE USING ATRICURE ATRICLIP ACHV40;  Surgeon: Kerin Perna, MD;  Location: [facility] OR;  Service: Open Heart Surgery;  Laterality: Left;   CORONARY ARTERY BYPASS GRAFT N/A 04/06/2018   Procedure: CORONARY ARTERY BYPASS GRAFTING (CABG) x 4 WITH ENDOSCOPIC HARVESTING OF RIGHT SAPHENOUS VEIN;  Surgeon: Kerin Perna, MD;  Location: [facility] OR;  Service: Open Heart Surgery;  Laterality: N/A;   LEFT HEART CATH AND CORONARY ANGIOGRAPHY N/A 03/12/2018   Procedure: LEFT HEART CATH AND CORONARY ANGIOGRAPHY;  Surgeon: [name], MD;  Location: [facility] INVASIVE CV LAB;  Service: Cardiovascular;  Laterality: N/A;   LITHOTRIPSY     MAZE N/A 04/06/2018   Procedure: MAZE;  Surgeon: Kerin Perna, MD;  Location: [facility] OR;  Service: Open Heart Surgery;  Laterality: N/A;   RIGHT ROTATOR CUFF     TEE WITHOUT CARDIOVERSION N/A 12/30/2014   Procedure: TRANSESOPHAGEAL ECHOCARDIOGRAM (TEE);  Surgeon: Chrystie Nose, MD;  Location: [facility] ENDOSCOPY;  Service: Cardiovascular;  Laterality: N/A;   TEE WITHOUT CARDIOVERSION N/A 04/06/2018   Procedure: TRANSESOPHAGEAL ECHOCARDIOGRAM (TEE);  Surgeon: Donata Clay, Theron Arista, MD;  Location: [facility] OR;  Service: Open Heart Surgery;  Laterality: N/A;       Home Medications  Prior to Admission medications   Medication Sig Start Date End Date Taking? Authorizing Provider  Ascorbic Acid (VITAMIN C) 1000 MG tablet Take 1,000 mg by mouth daily.    [provider]  aspirin EC 81 MG EC tablet Take 1 tablet (81 mg total) by mouth daily. 04/15/18   Barrett, Erin R, PA-C  atorvastatin (LIPITOR) 40 MG tablet TAKE 1 TABLET(40 MG) BY MOUTH DAILY AT 6 PM 07/18/21   Hilty, Lisette Abu, MD  clonazePAM (KLONOPIN) 1 MG tablet TAKE 1 TABLET(1 MG) BY MOUTH TWICE DAILY 04/26/22   [name], MD  Cyanocobalamin-Methylcobalamin (B-12 ULTRA PO) Take by mouth.     [provider]  cyclobenzaprine (FLEXERIL) 10 MG tablet Take 1 tablet (10 mg total) by mouth 3 (three) times daily as needed for muscle spasms. 03/26/21   [name], MD  Garlic 1000 MG CAPS Take 1,000 mg by mouth daily.     [provider]  Hypromellose (ARTIFICIAL TEARS OP) Place 1 drop into both eyes 3 (three) times daily as needed (for dry eyes).     [provider]  Multiple Vitamin (MULTIVITAMIN WITH MINERALS) TABS tablet Take 1 tablet by mouth daily. One-A-Day Men's    [provider]  mupirocin ointment (BACTROBAN) 2 % Apply 1 application  topically 3 (three) times daily. Patient not taking: Reported on 05/10/2022 03/15/22   Guy Sandifer L, PA  traZODone (DESYREL) 100 MG tablet TAKE 2 TABLETS(200 MG) BY MOUTH AT BEDTIME 04/08/22   [name], MD  venlafaxine XR (EFFEXOR-XR) 150 MG 24 hr capsule TAKE 1 CAPSULE BY MOUTH EVERY MORNING WITH BREAKFAST 04/25/22   [name], MD    Family History Family History  Problem Relation Age of Onset   Heart failure Mother        mitral valve disease   Diabetes Father    Heart failure Father        MI   Hypertension Father    CAD Father    Alcoholism Father    Cancer Sister     Social History Social History   Tobacco Use   Smoking status: Every Day    Packs/day: 0.50    Types: Cigarettes   Smokeless tobacco: Former   Tobacco comments:    Smokes half a pack per day. Cuts cigarettes in half - in a smoking cessation program  Vaping Use   Vaping Use: Never used  Substance Use Topics   Alcohol use: No    Alcohol/week: 0.0 standard drinks of alcohol   Drug use: No     Allergies   Sertraline hcl   Review of Systems Review of Systems  Constitutional: Negative.   HENT: Negative.    Eyes: Negative.   Respiratory: Negative.    Cardiovascular: Negative.   Gastrointestinal: Negative.   Genitourinary: Negative.   Musculoskeletal:  Positive for joint swelling. Negative  for arthralgias.  Skin:  Negative for color change and rash.  Neurological:  Negative for numbness.  Hematological:  Negative for adenopathy.     Physical Exam Triage Vital Signs ED Triage Vitals [05/10/22 1429]  Enc Vitals Group     BP 103/69     Pulse Rate 72     Resp 16     Temp 98.6 F (37 C)     Temp Source Oral     SpO2 96 %     Weight      Height      Head Circumference  Peak Flow      Pain Score      Pain Loc      Pain Edu?      Excl. in GC?    No data found.  Updated Vital Signs BP 103/69 (BP Location: Left Arm)   Pulse 72   Temp 98.6 F (37 C) (Oral)   Resp 16   SpO2 96%   Visual Acuity Right Eye Distance:   Left Eye Distance:   Bilateral Distance:    Right Eye Near:   Left Eye Near:    Bilateral Near:     Physical Exam Vitals and nursing note reviewed.  Constitutional:      General: He is not in acute distress. HENT:     Head: Normocephalic.  Eyes:     Pupils: Pupils are equal, round, and reactive to light.  Cardiovascular:     Rate and Rhythm: Normal rate.  Pulmonary:     Effort: Pulmonary effort is normal.  Musculoskeletal:     Right hand: Swelling present. No deformity, lacerations or bony tenderness. Normal strength.       Hands:     Comments: Right second finger has minimal swelling, with mild tenderness to palpation over the PIP and DIP joints.  No erythema or warmth. Decreased range of motion right second finger DIP and PIP joints, although active/passive flexion/extension present.  Skin:    General: Skin is warm and dry.  Neurological:     Mental Status: He is alert.      UC Treatments / Results  Labs (all labs ordered are listed, but only abnormal results are displayed) Labs Reviewed - No data to display  EKG   Radiology DG Finger Index Right  Result Date: 05/10/2022 CLINICAL DATA:  Injury to tip of right second finger 2 months ago with persistent swelling and decreased range of motion. EXAM: RIGHT INDEX FINGER  2+V COMPARISON:  None Available. FINDINGS: No acute fracture or dislocation. Mild flattening of the tuft of the distal phalanx is suggestive of prior injury. No bony destruction seen to suggest osteomyelitis. Mild osteoarthritis of the DIP joint. No bony lesions or erosions. Soft tissues are unremarkable and demonstrate no evidence of foreign body or soft tissue gas. IMPRESSION: No acute findings. Mild flattening of the tuft of the distal phalanx of the right second finger is suggestive of prior injury. Mild osteoarthritis of the DIP joint. Electronically Signed   By: Irish Lack M.D.   On: 05/10/2022 15:41    Procedures Procedures (including critical care time)  Medications Ordered in UC Medications - No data to display  Initial Impression / Assessment and Plan / UC Course  I have reviewed the triage vital signs and the nursing notes.  Pertinent labs & imaging results that were available during my care of the patient were reviewed by me and considered in my medical decision making (see chart for details).    No evidence finger fracture on x-ray. Suspect finger tendinopathy as a result of wearing a finger splint for several weeks. Given treatment instructions with range of motion and stretching exercises.  Followup with Dr. Rodney [facility] ([facility]) if not improving about two weeks.   Final Clinical Impressions(s) / UC Diagnoses   Final diagnoses:  Tendonitis of finger     Discharge Instructions      Apply diclofenac (Voltaren) gel to finger 3 to 4 times daily.  Begin finger range of motion and stretching exercises.     ED Prescriptions  None       Lattie Haw, MD 05/12/22 [phone]

## 2022-05-10 NOTE — Discharge Instructions (Signed)
Apply diclofenac (Voltaren) gel to finger 3 to 4 times daily.  Begin finger range of motion and stretching exercises.

## 2022-05-14 ENCOUNTER — Ambulatory Visit (INDEPENDENT_AMBULATORY_CARE_PROVIDER_SITE_OTHER): Payer: Medicare Other

## 2022-05-14 DIAGNOSIS — Z0000 Encounter for general adult medical examination without abnormal findings: Secondary | ICD-10-CM

## 2022-05-14 NOTE — Progress Notes (Signed)
Appointment Outcome: Completed, Session #: 8 Start time: 2:07pm   End time: 2:43pm   Total Mins: 36 minutes  AGREEMENTS SECTION   Overall Goal(s): Smoking cessation - Quit Date (August 2023)                                            Agreement/Action Steps:  Aim to smoke 5 or less cigarettes in total per day  Take out 10 cigarettes per day and refrain from taking out more cigarettes Cut off 1 inch to 1.25 inches from cigarette before smoking Practice mini quits Track time between smoking Implement positive self-talk Sit on porch or visit neighbor to deter smoking Drink water when the urge arises to smoke Work on garden project   Progress Notes:  Patient stated that he measured his cigarettes that he has been cutting before smoking and realized that he has been cutting approximately 1.25 inches off each cigarette before smoking. Patient shared that when he started cutting more of the cigarette, he found himself lighting more than 10 cigarettes per day but has been able to work his way back down. Patient stated that he was drinking water to help with the cravings, but he would still have the urge to smoke, which led to lighting more cigarettes. Patient shared that drinking water now helps him with managing his urges to smoke.   Patient reported that he opened a pack of cigarettes yesterday after breakfast and still have several cigarettes left after smoking earlier today. Calculated that if patient is smoking between 10-12 cut cigarettes per day, then he is smoking approximately 2.5-3 cigarettes in total daily. Patient mentioned that he is getting about three pulls from each cigarette and believes that he may be smoking less than 10 cut cigarettes per day on some occasions.   Patient stated that he has kept himself busy to reduce his urges to smoke and extend his mini quits. Patient has continued to work on his garden project and showed the progress that he has made. Patient mentioned that he  continues to engage in his morning devotional, which is a timeframe that he does not smoke. Patient shared that he encourages himself through prayer and reminds himself that he is capable to quitting. Patient expressed that he reflects on his progress and is proud of where is today with smoking. Patient reported that he does spend time with one neighbor.   Patient shared that he forgot to call [employer] to acquire nicotine patches to start wearing when he cut down to where he felt that he could break away from lighting cigarettes. Patient stated that he has asked himself why he just don't quit smoking by now. Patient mentioned that he was thinking that he will stop buying cigarettes at some point and see how things go without the patches first. Patient stated that he is at the point where he wants to wrap up his smoking and wants to be completely done before his visit with Dr. Rennis Golden on 9/7.  Indicators of Success and Accountability:  Patient has been able to start cutting off about 1.25 in of each cigarette before smoking, and smokes in total on average 2.5-3 whole cigarettes with this approach.  Readiness: Patient is in the action phase of smoking cessation.  Strengths and Supports: Patient is being supported by his sister. Patient shared that he continues to rely on his faith  and the confidence he has gained to master quitting.  Challenges and Barriers: Patient does not foresee any challenges with implementing his action steps over the next two weeks.    Coaching Outcomes: Patient programmed the 1800QUITNOW number in his phone so he will remember to call for the patches.   Patient stated that he is not ready to stop buying cigarettes just yet but will work towards that as a step.   Patient will continue to work on cutting down on the number of cut cigarettes that he smoke, while continuing to cut off about 1.25 inches from each cigarette.    Agreement/Action Steps:  Aim to smoke 3 or less  cigarettes in total per day  Take out 10 cigarettes per day and refrain from taking out more cigarettes Cut off 1.25 inches from cigarette before smoking Practice mini quits Track time between smoking Drink water when the urge arises to smoke Implement positive self-talk Call 1800QUITNOW for nicotine patches Stop buying cigarettes before starting to use the patch Sit on porch or visit neighbor to deter smoking Work on garden project   Attempted: Fulfilled - Patient completed the weekly agreement in full and was able to meet the challenge.

## 2022-05-20 ENCOUNTER — Encounter: Payer: Medicare Other | Admitting: Sports Medicine

## 2022-05-21 ENCOUNTER — Other Ambulatory Visit: Payer: Self-pay | Admitting: Internal Medicine

## 2022-05-21 ENCOUNTER — Telehealth: Payer: Self-pay | Admitting: Internal Medicine

## 2022-05-21 MED ORDER — TRAZODONE HCL 100 MG PO TABS: ORAL_TABLET | ORAL | 0 refills | Status: DC

## 2022-05-21 NOTE — Telephone Encounter (Signed)
Pt made appt for 05/31/22. Sent 30 day supply til appt.Marland KitchenRaechel Chute

## 2022-05-21 NOTE — Addendum Note (Signed)
Addended by: Deatra James on: 05/21/2022 04:04 PM   Modules accepted: Orders

## 2022-05-21 NOTE — Telephone Encounter (Signed)
Caller & Relationship to patient: Bianca Vester  Call back number: [phone]  Date of last office visit: 03/26/21  Date of next office visit: 05/31/22  Medication(s) to be refilled: traZODone (DESYREL) 100 MG tablet  Preferred Pharmacy:  [id number] [place], [place] - [address] AT [facility] OF [facility] & [address] Phone:  [phone]  Fax:  [phone]

## 2022-05-25 ENCOUNTER — Other Ambulatory Visit: Payer: Self-pay | Admitting: Internal Medicine

## 2022-05-28 ENCOUNTER — Ambulatory Visit (INDEPENDENT_AMBULATORY_CARE_PROVIDER_SITE_OTHER): Payer: Medicare Other

## 2022-05-28 DIAGNOSIS — Z0000 Encounter for general adult medical examination without abnormal findings: Secondary | ICD-10-CM

## 2022-05-28 NOTE — Progress Notes (Signed)
Appointment Outcome: Completed, Session #: 9 Start time: 2:10pm   End time: 2:40pm   Total Mins: 30 minutes  AGREEMENTS SECTION   Overall Goal(s): Smoking cessation - Quit Date (August 2023)    Agreement/Action Steps:  Aim to smoke 3 or less cigarettes in total per day  Take out 10 cigarettes per day and refrain from taking out more cigarettes Cut off 1.25 inches from cigarette before smoking Practice mini quits Track time between smoking Drink water when the urge arises to smoke Implement positive self-talk Call 1800QUITNOW for nicotine patches Stop buying cigarettes before starting to use the patch Sit on porch or visit neighbor to deter smoking Work on garden project    Progress Notes:  Patient shared that he called [employer] for the patches and was sent two boxes of patches and two boxes of lozenges. Patient stated that he cannot remember the dosage of the patches. Patient mentioned that he set his quit date with the representative for the first week in September. Patient shared that he is receiving messages from [employer] that helps track his progress and offer tips. Patient stated that he wants to have stopped smoking and using the patches prior to his visit with Dr. Rennis Golden on 9/7.   Patient stated that his smoking has been good, and he has been able to maintain smoking the allotted 10 cigarettes per day or less. Patient reported that he continues to cut off 1.25 inches of the cigarette prior to smoking and typically gets an average of 3 puffs. Patient stated that he smokes more during the day than in the evenings because he is busy watching movies. Patient shared that he typically goes 2 hours at the most without smoking during the evening and when working outside. Patient will take a break from his crafts around 2 hours and smoke a cigarette. Patient mentioned that when he is on the phone with his sister now, he puts his cigarettes up and sit outside on the porch to keep him  from smoking.   Patient stated that he has visited his neighbors and worked on completing his garden project to keep him distracted from smoking. Patient shared that he is working on getting his car repaired, but do not have any other projects to work on. Patient mentioned that he has been finding different things to do around the house to keep him busy and his mind off smoking. Patient stated that he does drink water but isn't sure if it helps. Patient mentioned that regardless he is switching smoking for a healthy habit of drinking more water. Patient shared that he is at the point where he will do whatever it takes to distract him from smoking.   Patient reported that he hasn't implementing positive self-talk during the past two weeks but believes that it will be helpful when he starts using the patches to help him not take the patch off to smoke. Patient stated that he will stop buying cigarettes before wearing the patches, so he doesn't have any to smoke at that time.    Indicators of Success and Accountability:  Patient stated that calling [employer] to obtain patches and being at the point of being ready to use them is his biggest indicator of success over the past two weeks.  Readiness: Patient is in the action phase of smoking cessation.  Strengths and Supports: Patient has relied on his faith and being disciplined to help him over the past two weeks. Challenges and Barriers: Patient does not foresee  any challenges to implementing his action steps over the next two weeks.     Coaching Outcomes: Over the course of the next two weeks, patient will implement action steps as outlined above until September 4th when he will start implementing the action steps outlined below.   Patient wrote a note to himself reminding him of when he will start using the patches and stop smoking.  Patient mentioned that he is going to purchase some Cortizone to help with any irritation from the patches.     Overall Goal(s): Smoking cessation - Quit Date (June 10, 2022)    Agreement/Action Steps:  Stop buying cigarettes before starting to use the patch Start wearing nicotine patch on September 4th Drink water and/or use lozenges when urged to smoke Implement positive self-talk/prayer to encourage self through not smoking Sit on porch or visit neighbor to deter smoking Engage in various activities around the house to keep mind off smoking   Attempted: Fulfilled - Patient has been able to maintain smoking 3 or less cigarettes in total by not smoking more of his 10-cigarette allotment per day that he continues to cut 1.25 inches from the cigarette before smoking. Patient continues to practice mini quits and tracking the time passed between smoking. Patient has been drinking water. Patient called [employer] and has received his nicotine replacement therapy products. Patient has completed his garden project within the past two weeks. Patient continues to sit on his porch ad visit neighbors to deter smoking.    Not attempted: Deferred - Patient expressed that implementing positive self-talk will be more beneficial to him when he starts using patches and having to remind himself not to smoke. Patient will stop buying cigarettes before he starts using the patch.

## 2022-05-31 ENCOUNTER — Other Ambulatory Visit: Payer: Self-pay

## 2022-05-31 ENCOUNTER — Encounter: Payer: Self-pay | Admitting: Internal Medicine

## 2022-05-31 ENCOUNTER — Ambulatory Visit (INDEPENDENT_AMBULATORY_CARE_PROVIDER_SITE_OTHER): Payer: Medicare Other | Admitting: Internal Medicine

## 2022-05-31 VITALS — BP 94/60 | HR 74 | Temp 98.2°F | Ht 72.0 in | Wt 214.6 lb

## 2022-05-31 DIAGNOSIS — F5101 Primary insomnia: Secondary | ICD-10-CM

## 2022-05-31 DIAGNOSIS — Z720 Tobacco use: Secondary | ICD-10-CM | POA: Diagnosis not present

## 2022-05-31 DIAGNOSIS — Z0000 Encounter for general adult medical examination without abnormal findings: Secondary | ICD-10-CM

## 2022-05-31 DIAGNOSIS — F411 Generalized anxiety disorder: Secondary | ICD-10-CM

## 2022-05-31 DIAGNOSIS — E782 Mixed hyperlipidemia: Secondary | ICD-10-CM

## 2022-05-31 DIAGNOSIS — I2510 Atherosclerotic heart disease of native coronary artery without angina pectoris: Secondary | ICD-10-CM | POA: Diagnosis not present

## 2022-05-31 DIAGNOSIS — I5022 Chronic systolic (congestive) heart failure: Secondary | ICD-10-CM | POA: Diagnosis not present

## 2022-05-31 LAB — COMPREHENSIVE METABOLIC PANEL
ALT: 21 U/L (ref 0–53)
AST: 22 U/L (ref 0–37)
Albumin: 4.4 g/dL (ref 3.5–5.2)
Alkaline Phosphatase: 52 U/L (ref 39–117)
BUN: 16 mg/dL (ref 6–23)
CO2: 28 mEq/L (ref 19–32)
Calcium: 9.2 mg/dL (ref 8.4–10.5)
Chloride: 104 mEq/L (ref 96–112)
Creatinine, Ser: 1.44 mg/dL (ref 0.40–1.50)
GFR: 48.38 mL/min — ABNORMAL LOW (ref >60.00)
Glucose, Bld: 86 mg/dL (ref 70–99)
Potassium: 4.2 mEq/L (ref 3.5–5.1)
Sodium: 140 mEq/L (ref 135–145)
Total Bilirubin: 0.7 mg/dL (ref 0.2–1.2)
Total Protein: 7 g/dL (ref 6.0–8.3)

## 2022-05-31 LAB — LIPID PANEL
Cholesterol: 115 mg/dL (ref 0–200)
HDL: 35 mg/dL — ABNORMAL LOW (ref >39.00)
NonHDL: 79.95
Total CHOL/HDL Ratio: 3
Triglycerides: 213 mg/dL — ABNORMAL HIGH (ref 0.0–149.0)
VLDL: 42.6 mg/dL — ABNORMAL HIGH (ref 0.0–40.0)

## 2022-05-31 LAB — CBC
HCT: 39.2 % (ref 39.0–52.0)
Hemoglobin: 13.4 g/dL (ref 13.0–17.0)
MCHC: 34.3 g/dL (ref 30.0–36.0)
MCV: 96.9 fl (ref 78.0–100.0)
Platelets: 178 10*3/uL (ref 150.0–400.0)
RBC: 4.05 Mil/uL — ABNORMAL LOW (ref 4.22–5.81)
RDW: 13.2 % (ref 11.5–15.5)
WBC: 8.8 10*3/uL (ref 4.0–10.5)

## 2022-05-31 LAB — LDL CHOLESTEROL, DIRECT: Direct LDL: 60 mg/dL

## 2022-05-31 MED ORDER — CYCLOBENZAPRINE HCL 10 MG PO TABS: 10.0000 mg | ORAL_TABLET | Freq: Three times a day (TID) | ORAL | 3 refills | Status: DC | PRN

## 2022-05-31 MED ORDER — VENLAFAXINE HCL ER 150 MG PO CP24: ORAL_CAPSULE | ORAL | 0 refills | Status: DC

## 2022-05-31 MED ORDER — VENLAFAXINE HCL ER 150 MG PO CP24
ORAL_CAPSULE | ORAL | 3 refills | Status: DC
Start: 2022-05-31 — End: 2022-07-10

## 2022-05-31 MED ORDER — TRAZODONE HCL 100 MG PO TABS: ORAL_TABLET | ORAL | 3 refills | Status: DC

## 2022-05-31 NOTE — Assessment & Plan Note (Signed)
Stable, using clonazepam 1 mg BID and effexor 150 mg daily and trazodone 200 mg qhs. Overall well controlled. Continue.

## 2022-05-31 NOTE — Patient Instructions (Signed)
We have refilled all the medicines.

## 2022-05-31 NOTE — Assessment & Plan Note (Signed)
Checking lipid panel and adjust lipitor 40 mg daily as needed. 

## 2022-05-31 NOTE — Assessment & Plan Note (Signed)
Is quitting smoking currently.

## 2022-05-31 NOTE — Assessment & Plan Note (Signed)
No flare today. Taking aspirin daily and lipitor.

## 2022-05-31 NOTE — Assessment & Plan Note (Signed)
Flu shot yearly. Covid-19 counseled. Pneumonia complete. Shingrix counseled. Tetanus up to date. Cologuard up to date. Counseled about sun safety and mole surveillance. Counseled about the dangers of distracted driving. Given 10 year screening recommendations.

## 2022-05-31 NOTE — Assessment & Plan Note (Signed)
No anginal symptoms today. Continue aspirin 81 mg daily and lipitor.

## 2022-05-31 NOTE — Progress Notes (Signed)
   Subjective:   Patient ID: Jeremy King, male    DOB: Sep 05, 1951, 71 y.o.   MRN: [id number]  HPI The patient is here for physical.  PMH, [employer], social history reviewed and updated  Review of Systems  Constitutional: Negative.   HENT: Negative.    Eyes: Negative.   Respiratory:  Negative for cough, chest tightness and shortness of breath.   Cardiovascular:  Negative for chest pain, palpitations and leg swelling.  Gastrointestinal:  Negative for abdominal distention, abdominal pain, constipation, diarrhea, nausea and vomiting.  Musculoskeletal: Negative.   Skin: Negative.   Neurological: Negative.   Psychiatric/Behavioral: Negative.      Objective:  Physical Exam Constitutional:      Appearance: He is well-developed.  HENT:     Head: Normocephalic and atraumatic.  Cardiovascular:     Rate and Rhythm: Normal rate and regular rhythm.  Pulmonary:     Effort: Pulmonary effort is normal. No respiratory distress.     Breath sounds: Normal breath sounds. No wheezing or rales.  Abdominal:     General: Bowel sounds are normal. There is no distension.     Palpations: Abdomen is soft.     Tenderness: There is no abdominal tenderness. There is no rebound.  Musculoskeletal:     Cervical back: Normal range of motion.  Skin:    General: Skin is warm and dry.  Neurological:     Mental Status: He is alert and oriented to person, place, and time.     Coordination: Coordination normal.     Vitals:   05/31/22 1434  BP: 94/60  Pulse: 74  Temp: 98.2 F (36.8 C)  TempSrc: Oral  SpO2: 96%  Weight: 214 lb 9.6 oz (97.3 kg)  Height: 6' (1.829 m)    Assessment & Plan:

## 2022-05-31 NOTE — Assessment & Plan Note (Signed)
Doing well with trazodone 200 mg qhs. Refilled.

## 2022-06-04 ENCOUNTER — Other Ambulatory Visit: Payer: Self-pay | Admitting: Internal Medicine

## 2022-06-13 ENCOUNTER — Encounter: Payer: Self-pay | Admitting: Internal Medicine

## 2022-06-13 ENCOUNTER — Ambulatory Visit: Payer: Medicare Other | Attending: Internal Medicine | Admitting: Internal Medicine

## 2022-06-13 ENCOUNTER — Ambulatory Visit (INDEPENDENT_AMBULATORY_CARE_PROVIDER_SITE_OTHER): Payer: Medicare Other

## 2022-06-13 ENCOUNTER — Ambulatory Visit: Payer: Medicare Other | Admitting: Internal Medicine

## 2022-06-13 VITALS — BP 100/60 | HR 86 | Ht 72.0 in | Wt 211.0 lb

## 2022-06-13 DIAGNOSIS — Z951 Presence of aortocoronary bypass graft: Secondary | ICD-10-CM | POA: Diagnosis not present

## 2022-06-13 DIAGNOSIS — I255 Ischemic cardiomyopathy: Secondary | ICD-10-CM

## 2022-06-13 DIAGNOSIS — Z720 Tobacco use: Secondary | ICD-10-CM | POA: Diagnosis not present

## 2022-06-13 DIAGNOSIS — Z0000 Encounter for general adult medical examination without abnormal findings: Secondary | ICD-10-CM

## 2022-06-13 DIAGNOSIS — I480 Paroxysmal atrial fibrillation: Secondary | ICD-10-CM

## 2022-06-13 DIAGNOSIS — E782 Mixed hyperlipidemia: Secondary | ICD-10-CM | POA: Diagnosis not present

## 2022-06-13 NOTE — Patient Instructions (Signed)
Medication Instructions:  Your physician recommends that you continue on your current medications as directed. Please refer to the Current Medication list given to you today.  *If you need a refill on your cardiac medications before your next appointment, please call your pharmacy*   Follow-Up: At  [facility], you and your health needs are our priority.  As part of our continuing mission to provide you with exceptional heart care, we have created designated Provider Care Teams.  These Care Teams include your primary Cardiologist (physician) and Advanced Practice Providers (APPs -  Physician Assistants and Nurse Practitioners) who all work together to provide you with the care you need, when you need it.  We recommend signing up for the patient portal called "MyChart".  Sign up information is provided on this After Visit Summary.  MyChart is used to connect with patients for Virtual Visits (Telemedicine).  Patients are able to view lab/test results, encounter notes, upcoming appointments, etc.  Non-urgent messages can be sent to your provider as well.   To learn more about what you can do with MyChart, go to [email or web address].    Your next appointment:   12 month(s)  The format for your next appointment:   In Person  Provider:   Kenneth C Hilty, MD    

## 2022-06-13 NOTE — Progress Notes (Signed)
OFFICE NOTE  Chief Complaint:  Routine follow-up  Primary Care Physician: Myrlene Brokerrawford, Elizabeth A, MD  HPI:  Jeremy King is a pleasant 71 year old male with few medical problems. He has a history of anxiety as well as dyslipidemia. Unfortunately he's had a number of syncopal episodes and has known orthostatic hypotension. He tells me that couple years ago he was seen [facility] and syncopized. Blood pressure was noted to be 60 over palpation. He was hospitalized for this and treated but is had recurrent hypotension. He was previously on Flomax which was discontinued. He does have a history of kidney stones with what sounds like ureteral stenting and/or lithotripsy. He probably saw a cardiologist once at the [facility] and he tells me that they did not recommend any different medications. His CHADSVASC score is 1 given his age of 71 and hypertension. He has been maintained on aspirin. Recently he was [facility] after a syncopal episode. He underwent cardiac workup including echocardiogram which shows normal LV function. He was noted to be in A. fib at that time. He says that it comes and goes but is not clear whether it has become more persistent. He's never had a cardioversion before. He denies significant alcohol use but has been a long-term smoker. There also is a strong family history of coronary disease in both parents. Recently he's been having problems with his knee and is contemplating left knee surgery with Dr. Ciro BackerGiofree.  It should be noted in the office today that he became dizzy after sitting up for his EKG. Orthostatic blood pressures were taken and he had a 20 point drop in systolic blood pressure with change in position.  I saw Jeremy King back in the office today. Unfortunately his home monitor has been alarming as he's had frequent atrial fibrillation with rapid ventricular response. Heart rates have been up into the 180s. He also had a near-syncopal episode. He  unfortunately did not check his blood pressure at that time. He's been pretty much in persistent A. fib since we placed the monitor. I think this is at least contributing to his symptoms. 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 Exam is not consistent with heart failure. Blood pressure is slightly improved today at 100 systolic, however it is limited any AV nodal blocking medications. He's currently only on aspirin because of a low CHADSVASC score.  Jeremy King returns today for follow-up. He successfully cardioverted and is maintaining sinus bradycardia. He's worn a Holter monitor which demonstrates several episodes of significant bradycardia into the 30s. He's had some sinus pauses of 2-1/2-3 seconds, which appear mostly at night. Laboratory work is unremarkable and digoxin level is 1.7. The digoxin, again was started for heart rate control prior to cardioversion as his blood pressure was less than 90 systolic with significant orthostasis. I also discontinued his trazodone which seems to have significantly improved his orthostasis. Blood pressure remains low today at 98/50. He underwent a nuclear stress test which showed 2 small fixed defects which were thought to be either small areas of scar or artifact. His echo showed normal wall motion and preserved systolic function. He is not describing anginal symptoms.  Jeremy King returns today for follow-up. He has since seen Dr. Johney FrameAllred who put him on multiecho for control of his atrial fibrillation. Overall he feels that he may be doing well with this although yesterday he thought his heart rate was irregular. From what he described it sounds  like it was regularly irregular, this may not be atrial fibrillation. He seems to be tolerating this and the Eliquis without any bleeding problems. He is come off of trazodone due to problems with orthostatic hypotension and syncope. He's not had any further episodes. He is having problems sleeping at night  though. He was on melatonin which was initially helpful but however since it has not been helpful. In the past we talked about the possibility of obstructive sleep apnea or other sleep related disorder. He was hesitant to get a sleep study but now seems to be interested in that.  09/19/2016  Jeremy King returns for follow-up. Overall he is doing well without recurrent atrial fibrillation. He is maintained on multiecho. She is EKG shows sinus rhythm with a PAC an incomplete right bundle branch block. QTC is 437 ms. He is on Eliquis and denies any significant bleeding problems. He uses melatonin for sleep at night and takes 15 mg. Is generally helps him get 5-6 hours of sleep. He denies any chest pain or worsening shortness of breath.  02/05/2018  Jeremy King was seen today in follow-up.  He says he is feeling well but unfortunately had a recent fall.  He said he was building a ramp for his kayak and somehow tripped and landed on the edge of the ramp.  He has a large area of ecchymosis over the right cheek which extends down into the right anterior neck and some suborbital ecchymosis.  He denies any pain or tenderness concerning for fracture.  He had no mental status changes or loss of consciousness.  He had no significant external bleeding.  He continues to take his Eliquis.  He says is been compliant with it and has not missed doses.  Of note, he is back in atrial fibrillation today.  He was very surprised to hear this since he is feeling well.  In the past he has been very symptomatic and has felt dizzy and passed out.  It may be because his rate is better controlled on Platea around.  This is the first episode of A. fib in the past several years.  02/18/2018  Jeremy King returns today for follow-up.  He underwent cardioversion on Monday and successfully converted back to sinus rhythm after single shock.  He says he actually felt a lot better with some more energy however yesterday felt somewhat fatigued.   He checked his blood pressure and his machine noted that he was out of rhythm.  He also felt somewhat fatigued.  Today's EKG shows she is back in A. fib at 72.  This represents breakthrough of his Multitak.  We discussed options for treatment today including washing out his Multitak and switching to an alternate antiarrhythmic medication or referral for A. fib ablation.  He would like to try medical therapy.  I mention if we were to start a 1C agent, he would need some ischemia evaluation since his last stress test was in 2016.  03/09/2018  Jeremy King returns for follow-up of stress test.  He underwent a nuclear stress test on 02/27/2018.  This showed a mildly reduced LVEF at 49%.  There was mild septal and inferior wall ischemia from the apex to base.  This was suggested as low risk however abnormal.  He denies any chest pain or worsening shortness of breath.  He does report some fatigue however this may be related to A. fib.  He noted to be have slightly more energy after converting back to sinus  rhythm for about a day and then went back into A. fib.  This is why proposed antiarrhythmic therapy.  Previously had been on Multaq for years and then broke through with recurrent AF.  The plan was to switch him to a different antiarrhythmic such as flecainide, but we needed to make sure that he had no coronary disease prior to that.  I discussed the stress test with him including the fact that it may represent a false positive.  Nonetheless a definitive coronary evaluation is warranted.  It could be also that if he has underlying coronary disease, that may be why he did not remain in normal rhythm after a successful cardioversion.  07/16/2018  Jeremy King returns for hospital follow-up.  Overall he is doing well.  He underwent four-vessel CABG as well as maze, clipping of the left atrial appendage and is back in sinus rhythm now on amiodarone.  He is also on Eliquis for anticoagulation.  He reports no significant  symptoms and said he had no real pain with the procedure.  Per Dr. Maren Beach, we are considering taking him off of amiodarone and eventually discontinuing his Eliquis.  05/24/2019  Jeremy King is seen today in follow-up.  He continues to do very well post surgery.  He is now more than 3 months after maze and left atrial appendage closure.  We previously discontinued amiodarone and discussed stopping his Eliquis.  He denies any chest pain or worsening shortness of breath.  He has been on low-dose fludrocortisone due to some hypotension.  Otherwise labs are well controlled.  His lipid profile showed LDL at goal less than 70.  EKG today shows sinus rhythm with a right bundle branch block at 86 and is unchanged.  He denies any recurrent A. Fib.  05/31/2020  Jeremy King returns today for annual follow-up.  He continues to do well now couple years after surgery.  He denies any shortness of breath or chest pain.  He has had no recurrent atrial fibrillation.  Of note he had an issue with syncope shortly after his surgery which has not recurred.  This resolved after decreasing his beta-blocker and starting low-dose fludrocortisone.  Blood pressures were not low but he was described as being orthostatic, however I did not see clear orthostatic vital signs.  LDL most recently was 53.  06/22/2021  Jeremy King returns today for follow-up.  Overall he says he feels very well.  He denies any chest pain, shortness of breath, recurrent A. fib or other symptoms.  He has come off of the fludrocortisone but his blood pressure remains low to low normal with systolics between 90 and 100.  Today's blood pressure was slightly higher.  He gets some occasional positional dizziness.  He stopped his beta-blocker because of this.  He cannot be on additional medicines for heart failure primarily due to hypotension.  Unfortunately recent echo was repeated and shows that his EF was lower at 40 to 45% and suggested an apical regional wall  motion abnormality.  The study was considered technically difficult due to poor image quality.  06/13/2022  Jeremy King is seen today in follow-up.  Overall he seems to be doing well.  He had a repeat echo this year which showed stable LVEF at 40 to 45%.  He is not on any heart failure medications primarily due to low blood pressure.  We have discussed the possibility of some options including an SGLT2 inhibitor which would be less likely to cause hypotension but he  wants to work on exercise and lifestyle modification first.  He says he feels well.  He has been working with our life coach to try to stop smoking.  He is wearing a patch today and is gotten rid of his tobacco paraphernalia.  He does seem somewhat committed to this.  PMHx:  Past Medical History:  Diagnosis Date   Anxiety    Back ache    Coronary artery disease    History of kidney stones    Hypotension    Kidney stones    Paroxysmal atrial fibrillation (HCC)    chads2vasc score is at least 1   Syncope    due to hypotensin/ complicated by RVR with afib    Past Surgical History:  Procedure Laterality Date   CARDIAC CATHETERIZATION     CARDIOVERSION N/A 12/30/2014   Procedure: CARDIOVERSION;  Surgeon: Chrystie Nose, MD;  Location[facility]COPY;  Service: Cardiovascular;  Laterality: N/A;   CARDIOVERSION N/A 02/16/2018   Procedure: CARDIOVERSION;  Surgeon: Chrystie Nose, MD;  Location: [facility] ENDOSCOPY;  Service: Cardiovascular;  Laterality: N/A;   CLIPPING OF ATRIAL APPENDAGE Left 04/06/2018   Procedure: CLIPPING OF ATRIAL APPENDAGE USING ATRICURE ATRICLIP ACHV40;  Surgeon: Kerin Perna, MD;  Location: [facility] OR;  Service: Open Heart Surgery;  Laterality: Left;   CORONARY ARTERY BYPASS GRAFT N/A 04/06/2018   Procedure: CORONARY ARTERY BYPASS GRAFTING (CABG) x 4 WITH ENDOSCOPIC HARVESTING OF RIGHT SAPHENOUS VEIN;  Surgeon: Kerin Perna, MD;  Location: [facility] OR;  Service: Open Heart Surgery;  Laterality: N/A;   LEFT HEART CATH AND  CORONARY ANGIOGRAPHY N/A 03/12/2018   Procedure: LEFT HEART CATH AND CORONARY ANGIOGRAPHY;  Surgeon: [name], MD;  Location: [facility] INVASIVE CV LAB;  Service: Cardiovascular;  Laterality: N/A;   LITHOTRIPSY     MAZE N/A 04/06/2018   Procedure: MAZE;  Surgeon: Kerin Perna, MD;  Location: [facility] OR;  Service: Open Heart Surgery;  Laterality: N/A;   RIGHT ROTATOR CUFF     TEE WITHOUT CARDIOVERSION N/A 12/30/2014   Procedure: TRANSESOPHAGEAL ECHOCARDIOGRAM (TEE);  Surgeon: Chrystie Nose, MD;  Location: [facility] ENDOSCOPY;  Service: Cardiovascular;  Laterality: N/A;   TEE WITHOUT CARDIOVERSION N/A 04/06/2018   Procedure: TRANSESOPHAGEAL ECHOCARDIOGRAM (TEE);  Surgeon: Donata Clay, Theron Arista, MD;  Location: [facility] OR;  Service: Open Heart Surgery;  Laterality: N/A;    FAMHx:  Family History  Problem Relation Age of Onset   Heart failure Mother        mitral valve disease   Diabetes Father    Heart failure Father        MI   Hypertension Father    CAD Father    Alcoholism Father    Cancer Sister     SOCHx:   reports that he has been smoking cigarettes. He has been smoking an average of .5 packs per day. He has quit using smokeless tobacco. He reports that he does not drink alcohol and does not use drugs.  ALLERGIES:  Allergies  Allergen Reactions   Sertraline Other (See Comments)    ROS: Pertinent items noted in HPI and remainder of comprehensive ROS otherwise negative.  HOME MEDS: Current Outpatient Medications  Medication Sig Dispense Refill   Ascorbic Acid (VITAMIN C) 1000 MG tablet Take 1,000 mg by mouth daily.     aspirin EC 81 MG EC tablet Take 1 tablet (81 mg total) by mouth daily.     atorvastatin (LIPITOR) 40 MG tablet TAKE 1 TABLET(40 MG)  BY MOUTH DAILY AT 6 PM 90 tablet 3   clonazePAM (KLONOPIN) 1 MG tablet TAKE 1 TABLET(1 MG) BY MOUTH TWICE DAILY 60 tablet 5   Cyanocobalamin-Methylcobalamin (B-12 ULTRA PO) Take by mouth.     cyclobenzaprine (FLEXERIL) 10 MG tablet Take 1 tablet (10  mg total) by mouth 3 (three) times daily as needed for muscle spasms. 270 tablet 3   fluticasone (FLONASE) 50 MCG/ACT nasal spray Place 1 spray into both nostrils daily.     Garlic 1000 MG CAPS Take 1,000 mg by mouth daily.      Hypromellose (ARTIFICIAL TEARS OP) Place 1 drop into both eyes 3 (three) times daily as needed (for dry eyes).      Multiple Vitamin (MULTIVITAMIN WITH MINERALS) TABS tablet Take 1 tablet by mouth daily. One-A-Day Men's     mupirocin ointment (BACTROBAN) 2 % Apply 1 application  topically 3 (three) times daily. 22 g 0   traZODone (DESYREL) 100 MG tablet TAKE 2 TABLETS(200 MG) BY MOUTH AT BEDTIME Keep appt for future refills 180 tablet 3   venlafaxine XR (EFFEXOR-XR) 150 MG 24 hr capsule TAKE 1 CAPSULE BY MOUTH EVERY MORNING WITH BREAKFAST 90 capsule 3   No current facility-administered medications for this visit.    LABS/IMAGING: No results found for this or any previous visit (from the past 48 hour(s)). No results found.  VITALS: BP 100/60 (BP Location: Left Arm, Patient Position: Sitting, Cuff Size: Normal)   Pulse 86   Ht 6' (1.829 m)   Wt 211 lb (95.7 kg)   SpO2 91%   BMI 28.62 kg/m   EXAM: General appearance: alert and no distress Neck: no carotid bruit, no JVD and thyroid not enlarged, symmetric, no tenderness/mass/nodules Lungs: clear to auscultation bilaterally and Well-healed midline scar Heart: regular rate and rhythm, S1, S2 normal, no murmur, click, rub or gallop Abdomen: soft, non-tender; bowel sounds normal; no masses,  no organomegaly Extremities: extremities normal, atraumatic, no cyanosis or edema Pulses: 2+ and symmetric Skin: Skin color, texture, turgor normal. No rashes or lesions  EKG: Normal sinus rhythm at 86, low voltage QRS, incomplete right bundle branch block-personally reviewed  ASSESSMENT: Coronary artery disease status post four-vessel CABG (LIMA to LAD, SVG to PDA, SVG to ramus, SVG to diagonal)-04/06/2018, modified maze  procedure and clipping of the left atrial appendage with 40 mm atrial cure clip Abnormal Myoview stress test-LVEF 49%, mild inferior ischemia (02/2018) -recent echo with LVEF 40 to 45% (05/31/2021) Recurrent atrial fibrillation -  on Multaq with recent cardioversion (CHADSVASC 2) RBBB Normal LV function by recent echocardiogram Remote syncopal episodes Tobacco abuse-cessation in place  PLAN: 1.   Mr. Schrecengost seems to be doing well without any worsening chest pain or shortness of breath.  LVEF seems to be stable at 40 to 45%.  He is not on any heart failure therapies due to hypotension.  We discussed the possibility of an SGLT2 inhibitor but he wants to work on exercise and lifestyle modification at this time.  I have encouraged him to continue to work on tobacco cessation and working with our lifestyle coach here.  Plan follow-up with me annually or sooner as necessary.  Chrystie Nose, MD, [facility], FACP  [place]  [facility]  Medical Director of the Advanced Lipid Disorders &  Cardiovascular Risk Reduction Clinic Diplomate of the American Board of Clinical Lipidology Attending Cardiologist  Direct Dial: [phone]  Fax: [phone]  Website:  [email or web address]  Lisette Abu Alvah Gilder 06/13/2022, 2:22 PM

## 2022-06-13 NOTE — Progress Notes (Signed)
Appointment Outcome: Completed, Session #: 10                         Start time: 1:50pm   End time: 2:15pm   Total Mins: 25 minutes  AGREEMENTS SECTION   Overall Goal(s): Smoking cessation - Quit Date (June 10, 2022)     Agreement/Action Steps:  Stop buying cigarettes before starting to use the patch Start wearing nicotine patch on September 4th Drink water and/or use lozenges when urged to smoke Implement positive self-talk/prayer to encourage self through not smoking Sit on porch or visit neighbor to deter smoking Engage in various activities around the house to keep mind off smoking   Progress Notes:  Patient reported that he smoked 2 cigarettes this morning before deciding to start wearing the patches that he received from [facility]. Patient stated that he received patches and lozenges to use to aid in smoking cessation. Patient mentioned that he threw away his cigarettes, cigarette bowl, and lighter. Patient stated that he has been implementing positive self-talk and praying for help to curb his urges to smoke and the strength to completely quit using nicotine.   Patient mentioned that prior to this morning he smoked approximately 3-5 cigarettes per day that he cutting as previously and getting about 3 puffs from each cigarette. Patient stated that he was able to follow his previous action steps up until this morning to continue decreasing his smoking.   Patient stated that since he completed all his wood projects, he continued to visit his neighbors and sit on the porch when he talks to his sister to deter smoking. Patient is still finding various activities to engage in (e.g., finding car parts for repair) to aid in keeping him focused on his goal. Patient stated that he was going home and not think about smoking.   Patient is not sure how many patches that he has of the 14 mg and if the second box contains 7 mg patches but will check when he goes back home. Patient is carrying  his lozenges with him for additional support. Patient has updated his sister on his progression to the patches and she will be supporting him.   Patient stated that he discussed with walking to improve heart function with Dr. Debara Pickett. Patient shared that he has decided to start walking in the mornings around 7:00am for 15-20 minutes and build his walking time up.    Indicators of Success and Accountability:  Patient has been able to progress to using nicotine patches.  Readiness: Patient is in the action stage of smoking cessation.  Strengths and Supports: Patient is being supported by his sister. Patient is relying on his faith and being disciplined.  Challenges and Barriers: Patient does not foresee any challenges to implementing his action steps over the next month.    Coaching Outcomes: Patient clarified the use of the lozenges with the patches and decided that he would use them only when he has an urge to smoke while using the patch (e.g., after eating).   Patient will start implementing walking into his daily routine to increase his physical activity.   Patient will implement the following action steps as outlined below over the next month.    Overall Goal(s): Smoking cessation - Quit Date (June 13, 2022)   Increase physical activity   Agreement/Action Steps:  Smoking cessation Do not purchase cigarettes/accept cigarettes from others.  Wear nicotine patch as prescribed Drink water and/or use lozenges  when urged to smoke Implement positive self-talk/prayer to encourage self through not smoking Sit on porch or visit neighbor to deter smoking Engage in various activities around the house to keep mind off smoking  Increase physical activity Walk in the mornings around 7:00am for 15-20 minutes   Attempted: Fulfilled - Patient completed the weekly agreement in full and was able to meet the challenge. Partial - Patient was able to start using patches on 9/7 instead of 9/4. Not  met - Patient did not attempt the agreement and did not express concern. 

## 2022-07-01 ENCOUNTER — Telehealth: Payer: Self-pay

## 2022-07-01 NOTE — Telephone Encounter (Signed)
MEDICATION: clonazePAM (KLONOPIN) 1 MG tablet  PHARMACY: [facility] [place] [place] [address] [place] AT [address]  Comments: Patient will be out tomorrow. Pharmacy states they were waiting on [place] to send it in.   **Let patient know to contact pharmacy at the end of the day to make sure medication is ready. **  ** Please notify patient to allow 48-72 hours to process**  **Encourage patient to contact the pharmacy for refills or they can request refills through [facility]**

## 2022-07-02 NOTE — Telephone Encounter (Signed)
Should still have plenty of refills advice to contact pharmacy. 6 month rx done in July

## 2022-07-02 NOTE — Telephone Encounter (Signed)
Notified pt regarding refills.

## 2022-07-02 NOTE — Telephone Encounter (Signed)
Last refill 04/26/22  Please advise

## 2022-07-09 ENCOUNTER — Other Ambulatory Visit: Payer: Self-pay | Admitting: Internal Medicine

## 2022-07-10 NOTE — Telephone Encounter (Signed)
Please advise 

## 2022-07-11 ENCOUNTER — Telehealth: Payer: Self-pay

## 2022-07-11 ENCOUNTER — Ambulatory Visit: Payer: Medicare Other

## 2022-07-11 DIAGNOSIS — Z0000 Encounter for general adult medical examination without abnormal findings: Secondary | ICD-10-CM

## 2022-07-11 NOTE — Telephone Encounter (Signed)
Called patient due to him cancelling his health coaching appointment scheduled for today at 2:15pm at [facility] so that he can be rescheduled. Patient did not answer. Left patient a message to return call.    Gerson Fauth [facility] Guide, Health Coach [address] [place] [address] Telephone: [phone] Email: [email or web address]

## 2022-07-31 ENCOUNTER — Other Ambulatory Visit: Payer: Self-pay | Admitting: Internal Medicine

## 2022-08-07 ENCOUNTER — Telehealth: Payer: Self-pay

## 2022-08-07 DIAGNOSIS — Z0000 Encounter for general adult medical examination without abnormal findings: Secondary | ICD-10-CM

## 2022-08-07 NOTE — Telephone Encounter (Signed)
Called patient to reschedule health coaching appointment. Patient did not answer either contact numbers. Left a message for patient to return call on home phone.  Lyric Hoar Truman Hayward [facility] Guide, Health Coach [address] [place] [address] Telephone: [phone] Email: [email or web address]

## 2022-10-16 ENCOUNTER — Telehealth: Payer: Self-pay

## 2022-10-16 DIAGNOSIS — Z0000 Encounter for general adult medical examination without abnormal findings: Secondary | ICD-10-CM

## 2022-10-16 NOTE — Telephone Encounter (Signed)
Called patient to determine if he was still interested in participating in health coaching. Left patient a message to return call.   Avelino Leeds, MS, ERHD, [facility]  Care Guide, Health & Wellness Coach [address] [place] [address] Telephone: [phone] Email: [email or web address]

## 2022-10-31 ENCOUNTER — Other Ambulatory Visit: Payer: Self-pay | Admitting: Internal Medicine

## 2022-11-01 ENCOUNTER — Encounter: Payer: Self-pay | Admitting: Internal Medicine

## 2023-05-04 ENCOUNTER — Other Ambulatory Visit: Payer: Self-pay | Admitting: Internal Medicine

## 2023-05-07 ENCOUNTER — Encounter (INDEPENDENT_AMBULATORY_CARE_PROVIDER_SITE_OTHER): Payer: Self-pay

## 2023-06-02 ENCOUNTER — Ambulatory Visit (INDEPENDENT_AMBULATORY_CARE_PROVIDER_SITE_OTHER): Payer: Medicare Other | Admitting: Internal Medicine

## 2023-06-02 ENCOUNTER — Encounter: Payer: Self-pay | Admitting: Internal Medicine

## 2023-06-02 VITALS — BP 100/60 | HR 64 | Temp 98.0°F | Ht 72.0 in | Wt 216.0 lb

## 2023-06-02 DIAGNOSIS — F411 Generalized anxiety disorder: Secondary | ICD-10-CM

## 2023-06-02 DIAGNOSIS — Z1159 Encounter for screening for other viral diseases: Secondary | ICD-10-CM

## 2023-06-02 DIAGNOSIS — I480 Paroxysmal atrial fibrillation: Secondary | ICD-10-CM | POA: Diagnosis not present

## 2023-06-02 DIAGNOSIS — E782 Mixed hyperlipidemia: Secondary | ICD-10-CM

## 2023-06-02 DIAGNOSIS — I2510 Atherosclerotic heart disease of native coronary artery without angina pectoris: Secondary | ICD-10-CM

## 2023-06-02 DIAGNOSIS — F5101 Primary insomnia: Secondary | ICD-10-CM | POA: Diagnosis not present

## 2023-06-02 DIAGNOSIS — I5022 Chronic systolic (congestive) heart failure: Secondary | ICD-10-CM | POA: Diagnosis not present

## 2023-06-02 DIAGNOSIS — Z0000 Encounter for general adult medical examination without abnormal findings: Secondary | ICD-10-CM | POA: Diagnosis not present

## 2023-06-02 LAB — COMPREHENSIVE METABOLIC PANEL
ALT: 28 U/L (ref 0–53)
AST: 25 U/L (ref 0–37)
Albumin: 4.3 g/dL (ref 3.5–5.2)
Alkaline Phosphatase: 61 U/L (ref 39–117)
BUN: 14 mg/dL (ref 6–23)
CO2: 29 mEq/L (ref 19–32)
Calcium: 9.7 mg/dL (ref 8.4–10.5)
Chloride: 106 mEq/L (ref 96–112)
Creatinine, Ser: 1.46 mg/dL (ref 0.40–1.50)
GFR: 47.25 mL/min — ABNORMAL LOW (ref >60.00)
Glucose, Bld: 88 mg/dL (ref 70–99)
Potassium: 4.5 mEq/L (ref 3.5–5.1)
Sodium: 141 mEq/L (ref 135–145)
Total Bilirubin: 0.8 mg/dL (ref 0.2–1.2)
Total Protein: 7.2 g/dL (ref 6.0–8.3)

## 2023-06-02 LAB — LIPID PANEL
Cholesterol: 159 mg/dL (ref 0–200)
HDL: 38 mg/dL — ABNORMAL LOW (ref >39.00)
NonHDL: 121.04
Total CHOL/HDL Ratio: 4
Triglycerides: 287 mg/dL — ABNORMAL HIGH (ref 0.0–149.0)
VLDL: 57.4 mg/dL — ABNORMAL HIGH (ref 0.0–40.0)

## 2023-06-02 LAB — CBC
HCT: 41.5 % (ref 39.0–52.0)
Hemoglobin: 13.9 g/dL (ref 13.0–17.0)
MCHC: 33.4 g/dL (ref 30.0–36.0)
MCV: 98.2 fl (ref 78.0–100.0)
Platelets: 228 10*3/uL (ref 150.0–400.0)
RBC: 4.23 Mil/uL (ref 4.22–5.81)
RDW: 13.2 % (ref 11.5–15.5)
WBC: 10.5 10*3/uL (ref 4.0–10.5)

## 2023-06-02 LAB — LDL CHOLESTEROL, DIRECT: Direct LDL: 109 mg/dL

## 2023-06-02 MED ORDER — VENLAFAXINE HCL ER 150 MG PO CP24
ORAL_CAPSULE | ORAL | 3 refills | Status: DC
Start: 2023-06-02 — End: 2024-05-14

## 2023-06-02 MED ORDER — CYCLOBENZAPRINE HCL 10 MG PO TABS
10.0000 mg | ORAL_TABLET | Freq: Three times a day (TID) | ORAL | 3 refills | Status: DC | PRN
Start: 2023-06-02 — End: 2024-06-22

## 2023-06-02 MED ORDER — TRAZODONE HCL 100 MG PO TABS: ORAL_TABLET | ORAL | 3 refills | Status: DC

## 2023-06-02 MED ORDER — CLONAZEPAM 1 MG PO TABS: ORAL_TABLET | ORAL | 5 refills | Status: DC

## 2023-06-02 NOTE — Assessment & Plan Note (Signed)
Flu shot declines Pneumonia declines. Shingrix declines. Tetanus declines. Cologuard due 2025. Counseled about sun safety and mole surveillance. Counseled about the dangers of distracted driving. Given 10 year screening recommendations.

## 2023-06-02 NOTE — Assessment & Plan Note (Signed)
Taking trazodone 200 mg at bedtime and doing well. Continue.

## 2023-06-02 NOTE — Assessment & Plan Note (Signed)
No flare today. He is taking crestor and aspirin and no chest pains. No swelling.

## 2023-06-02 NOTE — Assessment & Plan Note (Signed)
Taking effexor and doing well and also trazodone 200 mg at bedtime and clonazepam 1 mg BID. Refill as needed.

## 2023-06-02 NOTE — Assessment & Plan Note (Signed)
Sounds regular and not on medication for this. Is taking aspirin 81 mg daily. Not on beta blocker due to prior bradycardia.

## 2023-06-02 NOTE — Assessment & Plan Note (Signed)
Checking lipid panel and adjust as needed his lipitor 40 mg daily. LDL goal <70

## 2023-06-02 NOTE — Progress Notes (Signed)
Subjective:   Jeremy King is a 72 y.o. male who presents for Medicare Annual/Subsequent preventive examination as well as physical.  Visit Complete: In person  Review of Systems    Review of Systems  Constitutional: Negative.   HENT: Negative.    Eyes: Negative.   Respiratory:  Negative for cough, chest tightness and shortness of breath.   Cardiovascular:  Negative for chest pain, palpitations and leg swelling.  Gastrointestinal:  Negative for abdominal distention, abdominal pain, constipation, diarrhea, nausea and vomiting.  Musculoskeletal: Negative.   Skin: Negative.   Neurological: Negative.   Psychiatric/Behavioral: Negative.        Physical exam: Physical Exam Constitutional:      Appearance: Normal appearance.  HENT:     Head: Normocephalic and atraumatic.  Cardiovascular:     Rate and Rhythm: Normal rate and regular rhythm.  Pulmonary:     Effort: Pulmonary effort is normal.     Breath sounds: Normal breath sounds.  Abdominal:     General: Bowel sounds are normal.     Palpations: Abdomen is soft.  Musculoskeletal:        General: No tenderness.     Cervical back: Normal range of motion.  Skin:    General: Skin is warm and dry.  Neurological:     Mental Status: He is alert and oriented to person, place, and time.        Objective:    Today's Vitals   06/02/23 1309  BP: 100/60  Pulse: 64  Temp: 98 F (36.7 C)  TempSrc: Oral  SpO2: 97%  Weight: 216 lb (98 kg)  Height: 6' (1.829 m)   Body mass index is 29.29 kg/m.     06/02/2023    1:22 PM 04/19/2022    2:00 PM 03/19/2021    2:42 PM 04/07/2018   12:30 PM 04/02/2018    8:05 AM 03/12/2018    5:45 AM 02/16/2018    9:09 AM  Advanced Directives  Does Patient Have a Medical Advance Directive? Yes Yes Yes Yes Yes Yes No  Type of Special educational needs teacher of [place];Living will Living will;Healthcare Power of [employer] Power of [employer] Power of [employer] Power  of [place];Living will   Does patient want to make changes to medical advance directive? No - Patient declined  No - Patient declined No - Patient declined  No - Patient declined   Copy of Healthcare Power of Attorney in Chart?  No - copy requested No - copy requested   No - copy requested   Would patient like information on creating a medical advance directive?       No - Patient declined    Current Medications (verified) Outpatient Encounter Medications as of 06/02/2023  Medication Sig   Ascorbic Acid (VITAMIN C) 1000 MG tablet Take 1,000 mg by mouth daily.   aspirin EC 81 MG EC tablet Take 1 tablet (81 mg total) by mouth daily.   atorvastatin (LIPITOR) 40 MG tablet TAKE 1 TABLET(40 MG) BY MOUTH DAILY AT 6 PM   clonazePAM (KLONOPIN) 1 MG tablet TAKE 1 TABLET BY MOUTH TWICE A DAY   Cyanocobalamin-Methylcobalamin (B-12 ULTRA PO) Take by mouth.   cyclobenzaprine (FLEXERIL) 10 MG tablet Take 1 tablet (10 mg total) by mouth 3 (three) times daily as needed for muscle spasms.   Garlic 1000 MG CAPS Take 1,000 mg by mouth daily.    Multiple Vitamin (MULTIVITAMIN WITH MINERALS) TABS tablet Take 1 tablet by  mouth daily. One-A-Day Men's   mupirocin ointment (BACTROBAN) 2 % Apply 1 application  topically 3 (three) times daily.   traZODone (DESYREL) 100 MG tablet TAKE 2 TABLETS(200 MG) BY MOUTH AT BEDTIME Keep appt for future refills   venlafaxine XR (EFFEXOR-XR) 150 MG 24 hr capsule TAKE 1 CAPSULE BY MOUTH EVERY MORNING WITH BREAKFAST   Hypromellose (ARTIFICIAL TEARS OP) Place 1 drop into both eyes 3 (three) times daily as needed (for dry eyes).  (Patient not taking: Reported on 06/02/2023)   [DISCONTINUED] fluticasone (FLONASE) 50 MCG/ACT nasal spray Place 1 spray into both nostrils daily.   No facility-administered encounter medications on file as of 06/02/2023.    Allergies (verified) Sertraline   History: Past Medical History:  Diagnosis Date   Anxiety    Back ache    Coronary artery disease     History of kidney stones    Hypotension    Kidney stones    Paroxysmal atrial fibrillation (HCC)    chads2vasc score is at least 1   Syncope    due to hypotensin/ complicated by RVR with afib   Past Surgical History:  Procedure Laterality Date   CARDIAC CATHETERIZATION     CARDIOVERSION N/A 12/30/2014   Procedure: CARDIOVERSION;  Surgeon: Chrystie Nose, MD;  Location: [facility] ENDOSCOPY;  Service: Cardiovascular;  Laterality: N/A;   CARDIOVERSION N/A 02/16/2018   Procedure: CARDIOVERSION;  Surgeon: Chrystie Nose, MD;  Location: [facility] ENDOSCOPY;  Service: Cardiovascular;  Laterality: N/A;   CLIPPING OF ATRIAL APPENDAGE Left 04/06/2018   Procedure: CLIPPING OF ATRIAL APPENDAGE USING ATRICURE ATRICLIP [id number];  Surgeon: Kerin Perna, MD;  Location: [facility] OR;  Service: Open Heart Surgery;  Laterality: Left;   CORONARY ARTERY BYPASS GRAFT N/A 04/06/2018   Procedure: CORONARY ARTERY BYPASS GRAFTING (CABG) x 4 WITH ENDOSCOPIC HARVESTING OF RIGHT SAPHENOUS VEIN;  Surgeon: Kerin Perna, MD;  Location: [facility] OR;  Service: Open Heart Surgery;  Laterality: N/A;   LEFT HEART CATH AND CORONARY ANGIOGRAPHY N/A 03/12/2018   Procedure: LEFT HEART CATH AND CORONARY ANGIOGRAPHY;  Surgeon: [name], MD;  Location: [facility] INVASIVE CV LAB;  Service: Cardiovascular;  Laterality: N/A;   LITHOTRIPSY     MAZE N/A 04/06/2018   Procedure: MAZE;  Surgeon: Kerin Perna, MD;  Location: [facility] OR;  Service: Open Heart Surgery;  Laterality: N/A;   RIGHT ROTATOR CUFF     TEE WITHOUT CARDIOVERSION N/A 12/30/2014   Procedure: TRANSESOPHAGEAL ECHOCARDIOGRAM (TEE);  Surgeon: Chrystie Nose, MD;  Location: [facility] ENDOSCOPY;  Service: Cardiovascular;  Laterality: N/A;   TEE WITHOUT CARDIOVERSION N/A 04/06/2018   Procedure: TRANSESOPHAGEAL ECHOCARDIOGRAM (TEE);  Surgeon: Donata Clay, Theron Arista, MD;  Location: [facility] OR;  Service: Open Heart Surgery;  Laterality: N/A;   Family History  Problem Relation Age of Onset   Heart failure Mother         mitral valve disease   Diabetes Father    Heart failure Father        MI   Hypertension Father    CAD Father    Alcoholism Father    Cancer Sister    Social History   Socioeconomic History   Marital status: Divorced    Spouse name: Not on file   Number of children: 2   Years of education: 15   Highest education level: Not on file  Occupational History   Not on file  Tobacco Use   Smoking status: Every Day    Current packs/day: 0.50  Types: Cigarettes   Smokeless tobacco: Former   Tobacco comments:    Smokes half a pack per day. Cuts cigarettes in half - in a smoking cessation program  Vaping Use   Vaping status: Never Used  Substance and Sexual Activity   Alcohol use: No    Alcohol/week: 0.0 standard drinks of alcohol   Drug use: No   Sexual activity: Not on file  Other Topics Concern   Not on file  Social History Narrative   Not on file   Social Determinants of Health   Financial Resource Strain: Low Risk  (04/19/2022)   Overall Financial Resource Strain (CARDIA)    Difficulty of Paying Living Expenses: Not hard at all  Food Insecurity: No Food Insecurity (04/19/2022)   Hunger Vital Sign    Worried About Running Out of Food in the Last Year: Never true    Ran Out of Food in the Last Year: Never true  Transportation Needs: No Transportation Needs (04/19/2022)   PRAPARE - Administrator, Civil Service (Medical): No    Lack of Transportation (Non-Medical): No  Physical Activity: Insufficiently Active (04/19/2022)   Exercise Vital Sign    Days of Exercise per Week: 3 days    Minutes of Exercise per Session: 30 min  Stress: No Stress Concern Present (04/19/2022)   [employer] of Occupational Health - Occupational Stress Questionnaire    Feeling of Stress : Not at all  Social Connections: Moderately Isolated (04/19/2022)   Social Connection and Isolation Panel [NHANES]    Frequency of Communication with Friends and Family: Three times a week     Frequency of Social Gatherings with Friends and Family: Three times a week    Attends Religious Services: More than 4 times per year    Active Member of Clubs or Organizations: No    Attends Banker Meetings: Never    Marital Status: Divorced    Tobacco Counseling Ready to quit: Not Answered Counseling given: Not Answered Tobacco comments: Smokes half a pack per day. Cuts cigarettes in half - in a smoking cessation program   Clinical Intake:                        Activities of Daily Living     No data to display          Patient Care Team: [name], MD as PCP - General (Internal Medicine) Rennis Golden Lisette Abu, MD as PCP - Cardiology (Cardiology) Patient, No Pcp Per (General Practice) Renaee Munda (Cardiology)  Indicate any recent Medical Services you may have received from other than [facility] providers in the past year (date may be approximate).     Assessment:   This is a routine wellness examination for Jeremy King.  Hearing/Vision screen Intact to normal voice bilaterally  Dietary issues and exercise activities discussed:     Goals Addressed   None    Depression Screen    06/02/2023    1:12 PM 04/19/2022    2:01 PM 04/19/2022    1:59 PM 03/19/2021    2:41 PM 03/01/2020    8:37 AM 12/08/2018    1:10 PM 09/15/2015    8:48 AM  PHQ 2/9 Scores  PHQ - 2 Score 0 0 0 0 0 0 0  PHQ- 9 Score 0          Fall Risk    06/02/2023    1:12 PM 04/19/2022    2:01 PM  03/19/2021    2:44 PM 03/01/2020    8:15 AM 12/08/2018    1:10 PM  Fall Risk   Falls in the past year? 1 0 0 0 1  Number falls in past yr: 1 0 0 0 0  Injury with Fall? 0 0 0 0   Risk for fall due to :   No Fall Risks    Follow up Falls evaluation completed Falls evaluation completed;Education provided Falls evaluation completed      MEDICARE RISK AT HOME:    TIMED UP AND GO:  Was the test performed?  No    Cognitive Function:        Immunizations Immunization History   Administered Date(s) Administered   Fluad Quad(high Dose 65+) 06/18/2019   Influenza, High Dose Seasonal PF 07/31/2016   Influenza,inj,Quad PF,6+ Mos 11/27/2014   Influenza-Unspecified 10/12/2008, 07/07/2009, 12/07/2012, 07/21/2017, 07/21/2018, 06/08/2019, 06/09/2020, 07/07/2020   Pneumococcal Conjugate-13 03/02/2015   Pneumococcal Polysaccharide-23 11/27/2014, 05/01/2020   Tdap 11/11/2011, 11/21/2011, 03/17/2015    TDAP status: Up to date  Flu Vaccine status: Due, Education has been provided regarding the importance of this vaccine. Advised may receive this vaccine at local pharmacy or Health Dept. Aware to provide a copy of the vaccination record if obtained from local pharmacy or Health Dept. Verbalized acceptance and understanding.  Pneumococcal vaccine status: Up to date  Covid-19 vaccine status: Information provided on how to obtain vaccines.   Qualifies for Shingles Vaccine? Yes   Zostavax completed No   Shingrix Completed?: No.    Education has been provided regarding the importance of this vaccine. Patient has been advised to call insurance company to determine out of pocket expense if they have not yet received this vaccine. Advised may also receive vaccine at local pharmacy or Health Dept. Verbalized acceptance and understanding.  Screening Tests Health Maintenance  Topic Date Due   Hepatitis C Screening  Never done   Zoster Vaccines- Shingrix (1 of 2) Never done   Medicare Annual Wellness (AWV)  04/20/2023   INFLUENZA VACCINE  05/08/2023   COVID-19 Vaccine (1 - 2023-24 season) 06/18/2023 (Originally 06/07/2022)   Fecal DNA (Cologuard)  03/26/2024   DTaP/Tdap/Td (4 - Td or Tdap) 03/16/2025   Pneumonia Vaccine 32+ Years old  Completed   HPV VACCINES  Aged Out   Colonoscopy  Discontinued    Health Maintenance  Health Maintenance Due  Topic Date Due   Hepatitis C Screening  Never done   Zoster Vaccines- Shingrix (1 of 2) Never done   Medicare Annual Wellness (AWV)   04/20/2023   INFLUENZA VACCINE  05/08/2023    Colorectal cancer screening: Type of screening: Cologuard. Completed 2022. Repeat every 3 years  Lung Cancer Screening: (Low Dose CT Chest recommended if Age 2-80 years, 20 pack-year currently smoking OR have quit w/in 15years.) does not qualify.   Lung Cancer Screening Referral: n/a  Additional Screening:  Hepatitis C Screening: does qualify; Completed yes  Vision Screening: Recommended annual ophthalmology exams for early detection of glaucoma and other disorders of the eye. Is the patient up to date with their annual eye exam?  Yes  Who is the provider or what is the name of the office in which the patient attends annual eye exams? If pt is not established with a provider, would they like to be referred to a provider to establish care? No .   Dental Screening: Recommended annual dental exams for proper oral hygiene  Community Resource Referral / Chronic Care Management:  CRR required this visit?  No   CCM required this visit?  No     Plan:     I have personally reviewed and noted the following in the patient's chart:   Medical and social history Use of alcohol, tobacco or illicit drugs  Current medications and supplements including opioid prescriptions. Patient is not currently taking opioid prescriptions. Functional ability and status Nutritional status Physical activity Advanced directives List of other physicians Hospitalizations, surgeries, and ER visits in previous 12 months Vitals Screenings to include cognitive, depression, and falls Referrals and appointments  In addition, I have reviewed and discussed with patient certain preventive protocols, quality metrics, and best practice recommendations. A written personalized care plan for preventive services as well as general preventive health recommendations were provided to patient.     [name], MD   06/02/2023   After Visit Summary: Available on  mychart

## 2023-06-02 NOTE — Assessment & Plan Note (Signed)
Taking aspirin 81 mg daily and crestor to help prevent. No chest pain symptoms.

## 2023-06-03 LAB — HEPATITIS C ANTIBODY: Hepatitis C Ab: NONREACTIVE

## 2023-07-26 ENCOUNTER — Other Ambulatory Visit: Payer: Self-pay | Admitting: Internal Medicine

## 2023-08-15 ENCOUNTER — Encounter: Payer: Self-pay | Admitting: Internal Medicine

## 2023-08-15 ENCOUNTER — Ambulatory Visit: Payer: Medicare Other | Attending: Internal Medicine | Admitting: Internal Medicine

## 2023-08-15 VITALS — BP 112/56 | HR 81 | Ht 72.0 in | Wt 221.0 lb

## 2023-08-15 DIAGNOSIS — I480 Paroxysmal atrial fibrillation: Secondary | ICD-10-CM | POA: Diagnosis not present

## 2023-08-15 DIAGNOSIS — I2510 Atherosclerotic heart disease of native coronary artery without angina pectoris: Secondary | ICD-10-CM

## 2023-08-15 DIAGNOSIS — I5022 Chronic systolic (congestive) heart failure: Secondary | ICD-10-CM | POA: Diagnosis not present

## 2023-08-15 DIAGNOSIS — M12812 Other specific arthropathies, not elsewhere classified, left shoulder: Secondary | ICD-10-CM | POA: Diagnosis not present

## 2023-08-15 NOTE — Progress Notes (Signed)
OFFICE NOTE  Chief Complaint:  Routine follow-up  Primary Care Physician: [name], MD  HPI:  Jeremy King is a pleasant 72 year old male with few medical problems. He has a history of anxiety as well as dyslipidemia. Unfortunately he's had a number of syncopal episodes and has known orthostatic hypotension. He tells me that couple years ago he was seen at the [facility] and syncopized. Blood pressure was noted to be 60 over palpation. He was hospitalized for this and treated but is had recurrent hypotension. He was previously on Flomax which was discontinued. He does have a history of kidney stones with what sounds like ureteral stenting and/or lithotripsy. He probably saw a cardiologist once at the [place] and he tells me that they did not recommend any different medications. His CHADSVASC score is 1 given his age of 4 and hypertension. He has been maintained on aspirin. Recently he was seen in [facility] after a syncopal episode. He underwent cardiac workup including echocardiogram which shows normal LV function. He was noted to be in A. fib at that time. He says that it comes and goes but is not clear whether it has become more persistent. He's never had a cardioversion before. He denies significant alcohol use but has been a long-term smoker. There also is a strong family history of coronary disease in both parents. Recently he's been having problems with his knee and is contemplating left knee surgery with Dr. Ciro Backer.  It should be noted in the office today that he became dizzy after sitting up for his EKG. Orthostatic blood pressures were taken and he had a 20 point drop in systolic blood pressure with change in position.  I saw Jeremy King back in the office today. Unfortunately his home monitor has been alarming as he's had frequent atrial fibrillation with rapid ventricular response. Heart rates have been up into the 180s. He also had a near-syncopal episode. He  unfortunately did not check his blood pressure at that time. He's been pretty much in persistent A. fib since we placed the monitor. I think this is at least contributing to his symptoms. 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 Exam is not consistent with heart failure. Blood pressure is slightly improved today at 100 systolic, however it is limited any AV nodal blocking medications. He's currently only on aspirin because of a low CHADSVASC score.  Jeremy King returns today for follow-up. He successfully cardioverted and is maintaining sinus bradycardia. He's worn a Holter monitor which demonstrates several episodes of significant bradycardia into the 30s. He's had some sinus pauses of 2-1/2-3 seconds, which appear mostly at night. Laboratory work is unremarkable and digoxin level is 1.7. The digoxin, again was started for heart rate control prior to cardioversion as his blood pressure was less than 90 systolic with significant orthostasis. I also discontinued his trazodone which seems to have significantly improved his orthostasis. Blood pressure remains low today at 98/50. He underwent a nuclear stress test which showed 2 small fixed defects which were thought to be either small areas of scar or artifact. His echo showed normal wall motion and preserved systolic function. He is not describing anginal symptoms.  Jeremy King returns today for follow-up. He has since seen Dr. Johney Frame who put him on multiecho for control of his atrial fibrillation. Overall he feels that he may be doing well with this although yesterday he thought his heart rate was irregular. From what he described it sounds  like it was regularly irregular, this may not be atrial fibrillation. He seems to be tolerating this and the Eliquis without any bleeding problems. He is come off of trazodone due to problems with orthostatic hypotension and syncope. He's not had any further episodes. He is having problems sleeping at night  though. He was on melatonin which was initially helpful but however since it has not been helpful. In the past we talked about the possibility of obstructive sleep apnea or other sleep related disorder. He was hesitant to get a sleep study but now seems to be interested in that.  09/19/2016  Jeremy King returns for follow-up. Overall he is doing well without recurrent atrial fibrillation. He is maintained on multiecho. She is EKG shows sinus rhythm with a PAC an incomplete right bundle branch block. QTC is 437 ms. He is on Eliquis and denies any significant bleeding problems. He uses melatonin for sleep at night and takes 15 mg. Is generally helps him get 5-6 hours of sleep. He denies any chest pain or worsening shortness of breath.  02/05/2018  Jeremy King was seen today in follow-up.  He says he is feeling well but unfortunately had a recent fall.  He said he was building a ramp for his kayak and somehow tripped and landed on the edge of the ramp.  He has a large area of ecchymosis over the right cheek which extends down into the right anterior neck and some suborbital ecchymosis.  He denies any pain or tenderness concerning for fracture.  He had no mental status changes or loss of consciousness.  He had no significant external bleeding.  He continues to take his Eliquis.  He says is been compliant with it and has not missed doses.  Of note, he is back in atrial fibrillation today.  He was very surprised to hear this since he is feeling well.  In the past he has been very symptomatic and has felt dizzy and passed out.  It may be because his rate is better controlled on Belk around.  This is the first episode of A. fib in the past several years.  02/18/2018  Jeremy King returns today for follow-up.  He underwent cardioversion on Monday and successfully converted back to sinus rhythm after single shock.  He says he actually felt a lot better with some more energy however yesterday felt somewhat fatigued.   He checked his blood pressure and his machine noted that he was out of rhythm.  He also felt somewhat fatigued.  Today's EKG shows she is back in A. fib at 72.  This represents breakthrough of his Multitak.  We discussed options for treatment today including washing out his Multitak and switching to an alternate antiarrhythmic medication or referral for A. fib ablation.  He would like to try medical therapy.  I mention if we were to start a 1C agent, he would need some ischemia evaluation since his last stress test was in 2016.  03/09/2018  Jeremy King returns for follow-up of stress test.  He underwent a nuclear stress test on 02/27/2018.  This showed a mildly reduced LVEF at 49%.  There was mild septal and inferior wall ischemia from the apex to base.  This was suggested as low risk however abnormal.  He denies any chest pain or worsening shortness of breath.  He does report some fatigue however this may be related to A. fib.  He noted to be have slightly more energy after converting back to sinus  rhythm for about a day and then went back into A. fib.  This is why proposed antiarrhythmic therapy.  Previously had been on Multaq for years and then broke through with recurrent AF.  The plan was to switch him to a different antiarrhythmic such as flecainide, but we needed to make sure that he had no coronary disease prior to that.  I discussed the stress test with him including the fact that it may represent a false positive.  Nonetheless a definitive coronary evaluation is warranted.  It could be also that if he has underlying coronary disease, that may be why he did not remain in normal rhythm after a successful cardioversion.  07/16/2018  Jeremy King returns for hospital follow-up.  Overall he is doing well.  He underwent four-vessel CABG as well as maze, clipping of the left atrial appendage and is back in sinus rhythm now on amiodarone.  He is also on Eliquis for anticoagulation.  He reports no significant  symptoms and said he had no real pain with the procedure.  Per Dr. Maren Beach, we are considering taking him off of amiodarone and eventually discontinuing his Eliquis.  05/24/2019  Jeremy King is seen today in follow-up.  He continues to do very well post surgery.  He is now more than 3 months after maze and left atrial appendage closure.  We previously discontinued amiodarone and discussed stopping his Eliquis.  He denies any chest pain or worsening shortness of breath.  He has been on low-dose fludrocortisone due to some hypotension.  Otherwise labs are well controlled.  His lipid profile showed LDL at goal less than 70.  EKG today shows sinus rhythm with a right bundle branch block at 86 and is unchanged.  He denies any recurrent A. Fib.  05/31/2020  Jeremy King returns today for annual follow-up.  He continues to do well now couple years after surgery.  He denies any shortness of breath or chest pain.  He has had no recurrent atrial fibrillation.  Of note he had an issue with syncope shortly after his surgery which has not recurred.  This resolved after decreasing his beta-blocker and starting low-dose fludrocortisone.  Blood pressures were not low but he was described as being orthostatic, however I did not see clear orthostatic vital signs.  LDL most recently was 53.  06/22/2021  Jeremy King returns today for follow-up.  Overall he says he feels very well.  He denies any chest pain, shortness of breath, recurrent A. fib or other symptoms.  He has come off of the fludrocortisone but his blood pressure remains low to low normal with systolics between 90 and 100.  Today's blood pressure was slightly higher.  He gets some occasional positional dizziness.  He stopped his beta-blocker because of this.  He cannot be on additional medicines for heart failure primarily due to hypotension.  Unfortunately recent echo was repeated and shows that his EF was lower at 40 to 45% and suggested an apical regional wall  motion abnormality.  The study was considered technically difficult due to poor image quality.  06/13/2022  Jeremy King is seen today in follow-up.  Overall he seems to be doing well.  He had a repeat echo this year which showed stable LVEF at 40 to 45%.  He is not on any heart failure medications primarily due to low blood pressure.  We have discussed the possibility of some options including an SGLT2 inhibitor which would be less likely to cause hypotension but he  wants to work on exercise and lifestyle modification first.  He says he feels well.  He has been working with our life coach to try to stop smoking.  He is wearing a patch today and is gotten rid of his tobacco paraphernalia.  He does seem somewhat committed to this.  08/15/2023  Jeremy King is seen today in follow-up.  Overall he says he is feeling very well.  Denies any chest pain or shortness of breath.  Blood pressure is excellent today in fact diastolic is somewhat low.  He is on no blood pressure medications.  He continues on aspirin and statin therapy.  Cholesterol in August was increased a little bit with direct LDL 109 and total of 159.  He reports that he has been eating a little more ice cream as well as popcorn daily but he puts butter on it.  He also has had some weight gain which likely explains increase in cholesterol.  Other than that he is asymptomatic.  He denies any recurrent atrial fibrillation.  PMHx:  Past Medical History:  Diagnosis Date   Anxiety    Back ache    Coronary artery disease    History of kidney stones    Hypotension    Kidney stones    Paroxysmal atrial fibrillation (HCC)    chads2vasc score is at least 1   Syncope    due to hypotensin/ complicated by RVR with afib    Past Surgical History:  Procedure Laterality Date   CARDIAC CATHETERIZATION     CARDIOVERSION N/A 12/30/2014   Procedure: CARDIOVERSION;  Surgeon: Chrystie Nose, MD;  Location: [facility] ENDOSCOPY;  Service: Cardiovascular;  Laterality:  N/A;   CARDIOVERSION N/A 02/16/2018   Procedure: CARDIOVERSION;  Surgeon: Chrystie Nose, MD;  Location: [facility] ENDOSCOPY;  Service: Cardiovascular;  Laterality: N/A;   CLIPPING OF ATRIAL APPENDAGE Left 04/06/2018   Procedure: CLIPPING OF ATRIAL APPENDAGE USING ATRICURE ATRICLIP ACHV40;  Surgeon: Kerin Perna, MD;  Location: [facility] OR;  Service: Open Heart Surgery;  Laterality: Left;   CORONARY ARTERY BYPASS GRAFT N/A 04/06/2018   Procedure: CORONARY ARTERY BYPASS GRAFTING (CABG) x 4 WITH ENDOSCOPIC HARVESTING OF RIGHT SAPHENOUS VEIN;  Surgeon: Kerin Perna, MD;  Location: [facility] OR;  Service: Open Heart Surgery;  Laterality: N/A;   LEFT HEART CATH AND CORONARY ANGIOGRAPHY N/A 03/12/2018   Procedure: LEFT HEART CATH AND CORONARY ANGIOGRAPHY;  Surgeon: [name], MD;  Location: [facility] INVASIVE CV LAB;  Service: Cardiovascular;  Laterality: N/A;   LITHOTRIPSY     MAZE N/A 04/06/2018   Procedure: MAZE;  Surgeon: Kerin Perna, MD;  Location: [facility] OR;  Service: Open Heart Surgery;  Laterality: N/A;   RIGHT ROTATOR CUFF     TEE WITHOUT CARDIOVERSION N/A 12/30/2014   Procedure: TRANSESOPHAGEAL ECHOCARDIOGRAM (TEE);  Surgeon: Chrystie Nose, MD;  Location: [facility] ENDOSCOPY;  Service: Cardiovascular;  Laterality: N/A;   TEE WITHOUT CARDIOVERSION N/A 04/06/2018   Procedure: TRANSESOPHAGEAL ECHOCARDIOGRAM (TEE);  Surgeon: Donata Clay, Theron Arista, MD;  Location: [facility] OR;  Service: Open Heart Surgery;  Laterality: N/A;    FAMHx:  Family History  Problem Relation Age of Onset   Heart failure Mother        mitral valve disease   Diabetes Father    Heart failure Father        MI   Hypertension Father    CAD Father    Alcoholism Father    Cancer Sister     SOCHx:  reports that he has quit smoking. His smoking use included cigarettes. He has quit using smokeless tobacco. He reports that he does not drink alcohol and does not use drugs.  ALLERGIES:  Allergies  Allergen Reactions   Sertraline Other (See Comments)     ROS: Pertinent items noted in HPI and remainder of comprehensive ROS otherwise negative.  HOME MEDS: Current Outpatient Medications  Medication Sig Dispense Refill   Ascorbic Acid (VITAMIN C) 1000 MG tablet Take 1,000 mg by mouth daily.     aspirin EC 81 MG EC tablet Take 1 tablet (81 mg total) by mouth daily.     atorvastatin (LIPITOR) 40 MG tablet TAKE 1 TABLET(40 MG) BY MOUTH DAILY AT 6 PM 90 tablet 0   clonazePAM (KLONOPIN) 1 MG tablet TAKE 1 TABLET BY MOUTH TWICE A DAY 60 tablet 5   Cyanocobalamin-Methylcobalamin (B-12 ULTRA PO) Take by mouth.     cyclobenzaprine (FLEXERIL) 10 MG tablet Take 1 tablet (10 mg total) by mouth 3 (three) times daily as needed for muscle spasms. 270 tablet 3   Garlic 1000 MG CAPS Take 1,000 mg by mouth daily.      Hypromellose (ARTIFICIAL TEARS OP) Place 1 drop into both eyes 3 (three) times daily as needed (for dry eyes).     Multiple Vitamin (MULTIVITAMIN WITH MINERALS) TABS tablet Take 1 tablet by mouth daily. One-A-Day Men's     mupirocin ointment (BACTROBAN) 2 % Apply 1 application  topically 3 (three) times daily. 22 g 0   traZODone (DESYREL) 100 MG tablet TAKE 2 TABLETS(200 MG) BY MOUTH AT BEDTIME Keep appt for future refills 180 tablet 3   venlafaxine XR (EFFEXOR-XR) 150 MG 24 hr capsule TAKE 1 CAPSULE BY MOUTH EVERY MORNING WITH BREAKFAST 90 capsule 3   No current facility-administered medications for this visit.    LABS/IMAGING: No results found for this or any previous visit (from the past 48 hour(s)). No results found.  VITALS: BP (!) 112/56 (BP Location: Left Arm, Patient Position: Sitting, Cuff Size: Normal)   Pulse 81   Ht 6' (1.829 m)   Wt 221 lb (100.2 kg)   SpO2 93%   BMI 29.97 kg/m   EXAM: General appearance: alert and no distress Neck: no carotid bruit, no JVD and thyroid not enlarged, symmetric, no tenderness/mass/nodules Lungs: clear to auscultation bilaterally and Well-healed midline scar Heart: regular rate and  rhythm, S1, S2 normal, no murmur, click, rub or gallop Abdomen: soft, non-tender; bowel sounds normal; no masses,  no organomegaly Extremities: extremities normal, atraumatic, no cyanosis or edema Pulses: 2+ and symmetric Skin: Skin color, texture, turgor normal. No rashes or lesions  EKG: EKG Interpretation Date/Time:  Friday August 15 2023 14:09:58 EST Ventricular Rate:  81 PR Interval:  184 QRS Duration:  128 QT Interval:  398 QTC Calculation: 462 R Axis:   -21  Text Interpretation: Normal sinus rhythm Right bundle branch block No significant change since last tracing Anterior leads Confirmed by Zoila Shutter [phone]) on 08/15/2023 2:20:57 PM    ASSESSMENT: Coronary artery disease status post four-vessel CABG (LIMA to LAD, SVG to PDA, SVG to ramus, SVG to diagonal)-04/06/2018, modified maze procedure and clipping of the left atrial appendage with 40 mm atrial cure clip Abnormal Myoview stress test-LVEF 49%, mild inferior ischemia (02/2018) -recent echo with LVEF 40 to 45% (12/2021) Paroxysmal atrial fibrillation RBBB Remote syncopal episodes Tobacco abuse-cessation in place  PLAN: 1.   Jeremy King seems to be doing well without any chest pain  or worsening shortness of breath.  No evidence of atrial fibrillation today.  Cholesterol has gone up somewhat along with weight and he is aware that.  Is going to make some diet and lifestyle changes.  Otherwise no issues at present.  Will continue his current medications.  Although he does have reduced LV function on echo which was last 40 to 45% in 2023, he has NYHA class I symptoms.  Blood pressure is not allowed titration of medications.  Will therefore plan to continue our current therapies.  Chrystie Nose, MD, [facility], FACP  [place]  [facility]  Medical Director of the Advanced Lipid Disorders &  Cardiovascular Risk Reduction Clinic Diplomate of the American Board of Clinical Lipidology Attending Cardiologist  Direct Dial:  [phone]  Fax: [phone]  Website:  [email or web address]  Lisette Abu Amarachi Kotz 08/15/2023, 2:21 PM

## 2023-08-15 NOTE — Patient Instructions (Signed)
Medication Instructions:  NO CHANGES  *If you need a refill on your cardiac medications before your next appointment, please call your pharmacy*   Follow-Up: At [facility], you and your health needs are our priority.  As part of our continuing mission to provide you with exceptional heart care, we have created designated Provider Care Teams.  These Care Teams include your primary Cardiologist (physician) and Advanced Practice Providers (APPs -  Physician Assistants and Nurse Practitioners) who all work together to provide you with the care you need, when you need it.  We recommend signing up for the patient portal called "MyChart".  Sign up information is provided on this After Visit Summary.  MyChart is used to connect with patients for Virtual Visits (Telemedicine).  Patients are able to view lab/test results, encounter notes, upcoming appointments, etc.  Non-urgent messages can be sent to your provider as well.   To learn more about what you can do with MyChart, go to [email or web address].    Your next appointment:    12 months with Dr. Hilty  

## 2023-08-28 LAB — FECAL OCCULT BLOOD, IMMUNOCHEMICAL: IFOBT: NEGATIVE

## 2023-09-03 ENCOUNTER — Encounter: Payer: Self-pay | Admitting: Internal Medicine

## 2023-10-24 ENCOUNTER — Other Ambulatory Visit: Payer: Self-pay | Admitting: Internal Medicine

## 2023-11-25 ENCOUNTER — Encounter: Payer: Self-pay | Admitting: Internal Medicine

## 2023-12-12 ENCOUNTER — Other Ambulatory Visit: Payer: Self-pay

## 2023-12-12 MED ORDER — TRAZODONE HCL 100 MG PO TABS: ORAL_TABLET | ORAL | 3 refills | Status: DC

## 2023-12-12 NOTE — Telephone Encounter (Signed)
 This has bee changed and sent in

## 2023-12-13 ENCOUNTER — Encounter: Payer: Self-pay | Admitting: Internal Medicine

## 2023-12-16 NOTE — Telephone Encounter (Signed)
 Pt was last seen on 06/02/2023 for physical does he need to be seen?

## 2023-12-25 DIAGNOSIS — Z1212 Encounter for screening for malignant neoplasm of rectum: Secondary | ICD-10-CM | POA: Diagnosis not present

## 2023-12-25 DIAGNOSIS — Z1211 Encounter for screening for malignant neoplasm of colon: Secondary | ICD-10-CM | POA: Diagnosis not present

## 2023-12-30 LAB — EXTERNAL GENERIC LAB PROCEDURE: COLOGUARD: NEGATIVE

## 2023-12-30 LAB — COLOGUARD: COLOGUARD: NEGATIVE

## 2023-12-31 ENCOUNTER — Other Ambulatory Visit: Payer: Self-pay | Admitting: Internal Medicine

## 2024-05-14 ENCOUNTER — Other Ambulatory Visit: Payer: Self-pay | Admitting: Internal Medicine

## 2024-05-28 ENCOUNTER — Ambulatory Visit

## 2024-05-28 VITALS — Ht 72.0 in | Wt 221.0 lb

## 2024-05-28 DIAGNOSIS — Z0000 Encounter for general adult medical examination without abnormal findings: Secondary | ICD-10-CM

## 2024-05-28 NOTE — Progress Notes (Signed)
 Subjective:   Jeremy King is a 73 y.o. who presents for a Medicare Wellness preventive visit.  As a reminder, Annual Wellness Visits don't include a physical exam, and some assessments may be limited, especially if this visit is performed virtually. We may recommend an in-person follow-up visit with your provider if needed.  Visit Complete: Virtual I connected with  Jeremy King on 05/28/24 by a audio enabled telemedicine application and verified that I am speaking with the correct person using two identifiers.  Patient Location: Home  Provider Location: Office/Clinic  I discussed the limitations of evaluation and management by telemedicine. The patient expressed understanding and agreed to proceed.  Vital Signs: Because this visit was a virtual/telehealth visit, some criteria may be missing or patient reported. Any vitals not documented were not able to be obtained and vitals that have been documented are patient reported.  VideoDeclined- This patient declined Librarian, academic. Therefore the visit was completed with audio only.  Persons Participating in Visit: Patient.  AWV Questionnaire: Yes: Patient Medicare AWV questionnaire was completed by the patient on 05/24/2024; I have confirmed that all information answered by patient is correct and no changes since this date.  Cardiac Risk Factors include: advanced age (>17men, >5 women);dyslipidemia;Other (see comment), Risk factor comments: PAF, CAD     Objective:    Today's Vitals   05/28/24 1411  Weight: 221 lb (100.2 kg)  Height: 6' (1.829 m)   Body mass index is 29.97 kg/m.     05/28/2024    2:28 PM 06/02/2023    1:22 PM 04/19/2022    2:00 PM 03/19/2021    2:42 PM 04/07/2018   12:30 PM 04/02/2018    8:05 AM 03/12/2018    5:45 AM  Advanced Directives  Does Patient Have a Medical Advance Directive? No Yes Yes Yes Yes  Yes  Yes   Type of Surveyor, minerals;Living  will Living will;Healthcare Power of [employer] Power of [employer] Power of [employer] Power of [place];Living will  Does patient want to make changes to medical advance directive?  No - Patient declined  No - Patient declined No - Patient declined   No - Patient declined   Copy of Healthcare Power of Attorney in Chart?   No - copy requested No - copy requested   No - copy requested      Data saved with a previous flowsheet row definition    Current Medications (verified) Outpatient Encounter Medications as of 05/28/2024  Medication Sig   Ascorbic Acid  (VITAMIN C ) 1000 MG tablet Take 1,000 mg by mouth daily.   aspirin  EC 81 MG EC tablet Take 1 tablet (81 mg total) by mouth daily.   atorvastatin  (LIPITOR) 40 MG tablet TAKE 1 TABLET(40 MG) BY MOUTH DAILY AT 6 PM   clonazePAM  (KLONOPIN ) 1 MG tablet TAKE 1 TABLET BY MOUTH TWICE DAILY   Cyanocobalamin-Methylcobalamin (B-12 ULTRA PO) Take by mouth.   cyclobenzaprine  (FLEXERIL ) 10 MG tablet Take 1 tablet (10 mg total) by mouth 3 (three) times daily as needed for muscle spasms.   Garlic 1000 MG CAPS Take 1,000 mg by mouth daily.    Hypromellose (ARTIFICIAL TEARS OP) Place 1 drop into both eyes 3 (three) times daily as needed (for dry eyes).   Multiple Vitamin (MULTIVITAMIN WITH MINERALS) TABS tablet Take 1 tablet by mouth daily. One-A-Day Men's   mupirocin  ointment (BACTROBAN ) 2 % Apply 1 application  topically 3 (  three) times daily.   psyllium (METAMUCIL) 58.6 % powder Take 1 packet by mouth daily.   traZODone  (DESYREL ) 100 MG tablet TAKE 2 TABLETS(200 MG) BY MOUTH AT BEDTIME Keep appt for future refills   venlafaxine  XR (EFFEXOR -XR) 150 MG 24 hr capsule TAKE 1 CAPSULE BY MOUTH EVERY MORNING WITH BREAKFAST   No facility-administered encounter medications on file as of 05/28/2024.    Allergies (verified) Sertraline   History: Past Medical History:  Diagnosis Date   Anxiety    Back ache    Coronary artery disease     History of kidney stones    Hypotension    Kidney stones    Paroxysmal atrial fibrillation (HCC)    chads2vasc score is at least 1   Syncope    due to hypotensin/ complicated by RVR with afib   Past Surgical History:  Procedure Laterality Date   CARDIAC CATHETERIZATION     CARDIOVERSION N/A 12/30/2014   Procedure: CARDIOVERSION;  Surgeon: Vinie JAYSON Maxcy, MD;  Location[facility] ENDOSCOPY;  Service: Cardiovascular;  Laterality: N/A;   CARDIOVERSION N/A 02/16/2018   Procedure: CARDIOVERSION;  Surgeon: Maxcy Vinie JAYSON, MD;  Location: [facility] ENDOSCOPY;  Service: Cardiovascular;  Laterality: N/A;   CLIPPING OF ATRIAL APPENDAGE Left 04/06/2018   Procedure: CLIPPING OF ATRIAL APPENDAGE USING ATRICURE ATRICLIP ACHV40;  Surgeon: Fleeta Hanford Coy, MD;  Location: [facility] OR;  Service: Open Heart Surgery;  Laterality: Left;   CORONARY ARTERY BYPASS GRAFT N/A 04/06/2018   Procedure: CORONARY ARTERY BYPASS GRAFTING (CABG) x 4 WITH ENDOSCOPIC HARVESTING OF RIGHT SAPHENOUS VEIN;  Surgeon: Fleeta Hanford Coy, MD;  Location: [facility] OR;  Service: Open Heart Surgery;  Laterality: N/A;   LEFT HEART CATH AND CORONARY ANGIOGRAPHY N/A 03/12/2018   Procedure: LEFT HEART CATH AND CORONARY ANGIOGRAPHY;  Surgeon: Claudene Victory ORN, MD;  Location: [facility] INVASIVE CV LAB;  Service: Cardiovascular;  Laterality: N/A;   LITHOTRIPSY     MAZE N/A 04/06/2018   Procedure: MAZE;  Surgeon: Fleeta Hanford Coy, MD;  Location: [facility] OR;  Service: Open Heart Surgery;  Laterality: N/A;   RIGHT ROTATOR CUFF     TEE WITHOUT CARDIOVERSION N/A 12/30/2014   Procedure: TRANSESOPHAGEAL ECHOCARDIOGRAM (TEE);  Surgeon: Vinie JAYSON Maxcy, MD;  Location: [facility] ENDOSCOPY;  Service: Cardiovascular;  Laterality: N/A;   TEE WITHOUT CARDIOVERSION N/A 04/06/2018   Procedure: TRANSESOPHAGEAL ECHOCARDIOGRAM (TEE);  Surgeon: Fleeta Hanford, Coy, MD;  Location: [facility] OR;  Service: Open Heart Surgery;  Laterality: N/A;   Family History  Problem Relation Age of Onset   Heart failure Mother         mitral valve disease   Diabetes Father    Heart failure Father        MI   Hypertension Father    CAD Father    Alcoholism Father    Cancer Sister    Social History   Socioeconomic History   Marital status: Divorced    Spouse name: Not on file   Number of children: 2   Years of education: 15   Highest education level: Some college, no degree  Occupational History   Occupation: RETIRED  Tobacco Use   Smoking status: Former    Current packs/day: 0.50    Types: Cigarettes   Smokeless tobacco: Former   Tobacco comments:    Smokes half a pack per day. Cuts cigarettes in half - in a smoking cessation program  Vaping Use   Vaping status: Never Used  Substance and Sexual Activity   Alcohol   use: No    Alcohol /week: 0.0 standard drinks of alcohol    Drug use: No   Sexual activity: Not on file  Other Topics Concern   Not on file  Social History Narrative   Lives alone/2025 and a dog   Social Drivers of Health   Financial Resource Strain: Low Risk  (05/24/2024)   Overall Financial Resource Strain (CARDIA)    Difficulty of Paying Living Expenses: Not hard at all  Food Insecurity: No Food Insecurity (05/24/2024)   Hunger Vital Sign    Worried About Running Out of Food in the Last Year: Never true    Ran Out of Food in the Last Year: Never true  Transportation Needs: No Transportation Needs (05/24/2024)   PRAPARE - Administrator, Civil Service (Medical): No    Lack of Transportation (Non-Medical): No  Physical Activity: Sufficiently Active (05/24/2024)   Exercise Vital Sign    Days of Exercise per Week: 6 days    Minutes of Exercise per Session: 40 min  Stress: No Stress Concern Present (05/24/2024)   [employer] of Occupational Health - Occupational Stress Questionnaire    Feeling of Stress: Not at all  Social Connections: Moderately Isolated (05/24/2024)   Social Connection and Isolation Panel    Frequency of Communication with Friends and Family: More than  three times a week    Frequency of Social Gatherings with Friends and Family: Patient declined    Attends Religious Services: More than 4 times per year    Active Member of [employer] or Organizations: No    Attends Engineer, structural: Not on file    Marital Status: Divorced    Tobacco Counseling Counseling given: Not Answered Tobacco comments: Smokes half a pack per day. Cuts cigarettes in half - in a smoking cessation program    Clinical Intake:  Pre-visit preparation completed: Yes  Pain : No/denies pain     BMI - recorded: 29.97 Nutritional Status: BMI 25 -29 Overweight Nutritional Risks: None Diabetes: No  Lab Results  Component Value Date   HGBA1C 5.4 03/01/2020   HGBA1C 5.3 04/02/2018   HGBA1C 5.5 10/17/2017     How often do you need to have someone help you when you read instructions, pamphlets, or other written materials from your doctor or pharmacy?: 1 - Never  Interpreter Needed?: No  Information entered by :: Taquita Demby, RMA   Activities of Daily Living     05/24/2024    4:49 PM  In your present state of health, do you have any difficulty performing the following activities:  Hearing? 0  Vision? 0  Difficulty concentrating or making decisions? 0  Walking or climbing stairs? 0  Dressing or bathing? 0  Doing errands, shopping? 0  Preparing Food and eating ? N  Using the Toilet? Y  In the past six months, have you accidently leaked urine? N  Do you have problems with loss of bowel control? N  Managing your Medications? N  Managing your Finances? N  Housekeeping or managing your Housekeeping? N    Patient Care Team: Rollene Almarie LABOR, MD as PCP - General (Internal Medicine) Mona Vinie BROCKS, MD as PCP - Cardiology (Cardiology) Patient, No Pcp Per (General Practice) Jama No (Inactive) (Cardiology)  I have updated your Care Teams any recent Medical Services you may have received from other providers in the past year.      Assessment:   This is a routine wellness examination for Keiffer.  Hearing/Vision screen  Hearing Screening - Comments:: Denies hearing difficulties   Vision Screening - Comments[facility]   Goals Addressed   None    Depression Screen     05/28/2024    2:35 PM 06/02/2023    1:12 PM 04/19/2022    2:01 PM 04/19/2022    1:59 PM 03/19/2021    2:41 PM 03/01/2020    8:37 AM 12/08/2018    1:10 PM  PHQ 2/9 Scores  PHQ - 2 Score 0 0 0 0 0 0 0  PHQ- 9 Score 0 0         Fall Risk     05/24/2024    4:49 PM 06/02/2023    1:12 PM 04/19/2022    2:01 PM 03/19/2021    2:44 PM 03/01/2020    8:15 AM  Fall Risk   Falls in the past year? 0 1 0 0 0  Number falls in past yr: 0 1 0 0 0  Injury with Fall? 0 0 0 0 0  Risk for fall due to :    No Fall Risks   Follow up Falls evaluation completed;Falls prevention discussed Falls evaluation completed Falls evaluation completed;Education provided  Falls evaluation completed       Data saved with a previous flowsheet row definition    MEDICARE RISK AT HOME:  Medicare Risk at Home Any stairs in or around the home?: (Patient-Rptd) No If so, are there any without handrails?: (Patient-Rptd) No Home free of loose throw rugs in walkways, pet beds, electrical cords, etc?: (Patient-Rptd) No Adequate lighting in your home to reduce risk of falls?: (Patient-Rptd) Yes Life alert?: (Patient-Rptd) No Use of a cane, walker or w/c?: (Patient-Rptd) No Grab bars in the bathroom?: (Patient-Rptd) No Shower chair or bench in shower?: (Patient-Rptd) No Elevated toilet seat or a handicapped toilet?: (Patient-Rptd) No  TIMED UP AND GO:  Was the test performed?  No  Cognitive Function: Declined/Normal: No cognitive concerns noted by patient or family. Patient alert, oriented, able to answer questions appropriately and recall recent events. No signs of memory loss or confusion.        Immunizations Immunization History  Administered Date(s)  Administered   Fluad Quad(high Dose 65+) 06/18/2019   Influenza, High Dose Seasonal PF 07/31/2016   Influenza,inj,Quad PF,6+ Mos 11/27/2014   Influenza-Unspecified 10/12/2008, 07/07/2009, 12/07/2012, 07/21/2017, 07/21/2018, 06/08/2019, 06/09/2020, 07/07/2020   Pneumococcal Conjugate-13 03/02/2015   Pneumococcal Polysaccharide-23 11/27/2014, 05/01/2020   Tdap 11/11/2011, 11/21/2011, 03/17/2015    Screening Tests Health Maintenance  Topic Date Due   Zoster Vaccines- Shingrix (1 of 2) Never done   COVID-19 Vaccine (1 - 2024-25 season) Never done   INFLUENZA VACCINE  05/07/2024   DTaP/Tdap/Td (4 - Td or Tdap) 03/16/2025   Medicare Annual Wellness (AWV)  05/28/2025   Fecal DNA (Cologuard)  12/25/2026   Pneumococcal Vaccine: 50+ Years  Completed   Hepatitis C Screening  Completed   HPV VACCINES  Aged Out   Meningococcal B Vaccine  Aged Out   Colonoscopy  Discontinued    Health Maintenance  Health Maintenance Due  Topic Date Due   Zoster Vaccines- Shingrix (1 of 2) Never done   COVID-19 Vaccine (1 - 2024-25 season) Never done   INFLUENZA VACCINE  05/07/2024   Health Maintenance Items Addressed: See Nurse Notes at the end of this note  Additional Screening:  Vision Screening: Recommended annual ophthalmology exams for early detection of glaucoma and other disorders of the eye. Would you like a referral  to an eye doctor? No    Dental Screening: Recommended annual dental exams for proper oral hygiene  Community Resource Referral / Chronic Care Management: CRR required this visit?  No   CCM required this visit?  No   Plan:    I have personally reviewed and noted the following in the patient's chart:   Medical and social history Use of alcohol , tobacco or illicit drugs  Current medications and supplements including opioid prescriptions. Patient is not currently taking opioid prescriptions. Functional ability and status Nutritional status Physical activity Advanced  directives List of other physicians Hospitalizations, surgeries, and ER visits in previous 12 months Vitals Screenings to include cognitive, depression, and falls Referrals and appointments  In addition, I have reviewed and discussed with patient certain preventive protocols, quality metrics, and best practice recommendations. A written personalized care plan for preventive services as well as general preventive health recommendations were provided to patient.   Amiri Tritch L Gianlucas Evenson, CMA   05/28/2024   After Visit Summary: (MyChart) Due to this being a telephonic visit, the after visit summary with patients personalized plan was offered to patient via MyChart   Notes: Nothing significant to report at this time.Patient declines all due vaccines for now.

## 2024-05-28 NOTE — Patient Instructions (Signed)
 Mr. Jeremy King , Thank you for taking time out of your busy schedule to complete your Annual Wellness Visit with me. I enjoyed our conversation and look forward to speaking with you again next year. I, as well as your care team,  appreciate your ongoing commitment to your health goals. Please review the following plan we discussed and let me know if I can assist you in the future. Your Game plan/ To Do List    Follow up Visits: We will see or speak with you next year for your Next Medicare AWV with our clinical staff Have you seen your provider in the last 6 months (3 months if uncontrolled diabetes)? No.  Next office visit on 06/14/2024.  Clinician Recommendations:  Aim for 30 minutes of exercise or brisk walking, 6-8 glasses of water, and 5 servings of fruits and vegetables each day. Happy Early Dortha!      This is a list of the screenings recommended for you:  Health Maintenance  Topic Date Due   Zoster (Shingles) Vaccine (1 of 2) Never done   COVID-19 Vaccine (1 - 2024-25 season) Never done   Flu Shot  05/07/2024   Medicare Annual Wellness Visit  06/01/2024   DTaP/Tdap/Td vaccine (4 - Td or Tdap) 03/16/2025   Cologuard (Stool DNA test)  12/25/2026   Pneumococcal Vaccine for age over 32  Completed   Hepatitis C Screening  Completed   HPV Vaccine  Aged Out   Meningitis B Vaccine  Aged Out   Colon Cancer Screening  Discontinued    Advanced directives: (Declined) Advance directive discussed with you today. Even though you declined this today, please call our office should you change your mind, and we can give you the proper paperwork for you to fill out. Advance Care Planning is important because it:  [x]  Makes sure you receive the medical care that is consistent with your values, goals, and preferences  [x]  It provides guidance to your family and loved ones and reduces their decisional burden about whether or not they are making the right decisions based on your wishes.  Follow the  link provided in your after visit summary or read over the paperwork we have mailed to you to help you started getting your Advance Directives in place. If you need assistance in completing these, please reach out to us  so that we can help you!  See attachments for Preventive Care and Fall Prevention Tips.

## 2024-05-29 ENCOUNTER — Encounter: Payer: Self-pay | Admitting: Internal Medicine

## 2024-06-01 NOTE — Telephone Encounter (Signed)
 I have called patient to clarify this up for him as there seem to be some  confusion. I am also sending over a my chart message as well.

## 2024-06-08 ENCOUNTER — Encounter: Payer: Self-pay | Admitting: Sports Medicine

## 2024-06-14 ENCOUNTER — Encounter: Admitting: Internal Medicine

## 2024-06-22 ENCOUNTER — Encounter: Payer: Self-pay | Admitting: Internal Medicine

## 2024-06-22 ENCOUNTER — Ambulatory Visit: Admitting: Internal Medicine

## 2024-06-22 ENCOUNTER — Ambulatory Visit

## 2024-06-22 ENCOUNTER — Ambulatory Visit (INDEPENDENT_AMBULATORY_CARE_PROVIDER_SITE_OTHER): Admitting: Family Medicine

## 2024-06-22 VITALS — BP 112/80 | HR 87 | Temp 98.1°F | Ht 72.0 in

## 2024-06-22 VITALS — BP 112/80 | HR 87 | Ht 72.0 in

## 2024-06-22 DIAGNOSIS — S8262XA Displaced fracture of lateral malleolus of left fibula, initial encounter for closed fracture: Secondary | ICD-10-CM

## 2024-06-22 DIAGNOSIS — Z0001 Encounter for general adult medical examination with abnormal findings: Secondary | ICD-10-CM | POA: Diagnosis not present

## 2024-06-22 DIAGNOSIS — F5101 Primary insomnia: Secondary | ICD-10-CM

## 2024-06-22 DIAGNOSIS — M25572 Pain in left ankle and joints of left foot: Secondary | ICD-10-CM

## 2024-06-22 DIAGNOSIS — Z0000 Encounter for general adult medical examination without abnormal findings: Secondary | ICD-10-CM

## 2024-06-22 DIAGNOSIS — I5022 Chronic systolic (congestive) heart failure: Secondary | ICD-10-CM | POA: Diagnosis not present

## 2024-06-22 DIAGNOSIS — M7732 Calcaneal spur, left foot: Secondary | ICD-10-CM | POA: Diagnosis not present

## 2024-06-22 DIAGNOSIS — F411 Generalized anxiety disorder: Secondary | ICD-10-CM | POA: Diagnosis not present

## 2024-06-22 DIAGNOSIS — E782 Mixed hyperlipidemia: Secondary | ICD-10-CM

## 2024-06-22 DIAGNOSIS — I480 Paroxysmal atrial fibrillation: Secondary | ICD-10-CM

## 2024-06-22 DIAGNOSIS — S82432A Displaced oblique fracture of shaft of left fibula, initial encounter for closed fracture: Secondary | ICD-10-CM | POA: Diagnosis not present

## 2024-06-22 DIAGNOSIS — S82892A Other fracture of left lower leg, initial encounter for closed fracture: Secondary | ICD-10-CM | POA: Diagnosis not present

## 2024-06-22 MED ORDER — VENLAFAXINE HCL ER 150 MG PO CP24: ORAL_CAPSULE | ORAL | 3 refills | Status: DC

## 2024-06-22 MED ORDER — TRAZODONE HCL 100 MG PO TABS: ORAL_TABLET | ORAL | 3 refills | Status: AC

## 2024-06-22 MED ORDER — CLONAZEPAM 1 MG PO TABS: 1.0000 mg | ORAL_TABLET | Freq: Two times a day (BID) | ORAL | 5 refills | Status: DC

## 2024-06-22 MED ORDER — TRAMADOL HCL 50 MG PO TABS: 50.0000 mg | ORAL_TABLET | Freq: Three times a day (TID) | ORAL | 0 refills | Status: AC | PRN

## 2024-06-22 MED ORDER — CYCLOBENZAPRINE HCL 10 MG PO TABS: 10.0000 mg | ORAL_TABLET | Freq: Three times a day (TID) | ORAL | 3 refills | Status: DC | PRN

## 2024-06-22 NOTE — Assessment & Plan Note (Signed)
Checking lipid panel and adjust lipitor as needed.  

## 2024-06-22 NOTE — Assessment & Plan Note (Signed)
 Using trazodone  and stable. Continue.

## 2024-06-22 NOTE — Progress Notes (Signed)
   LILLETTE Ileana Collet, PhD, LAT, ATC acting as a scribe for [name], MD.  Jeremy King is a 73 y.o. male who presents to [facility] at [facility] today for L ankle pain ongoing since last Tuesday. He was standing in the kitchen, lost his balance, fell, landing on his L side. He notes he was able to get around fine, prior to this fall.  L ankle swelling: yes Treatments tried: IBU  Pertinent review of systems: No fevers or chills  Relevant historical information: Atrial fibrillation heart failure heart disease.   Exam:  BP 112/80   Pulse 87   Ht 6' (1.829 m)   SpO2 97%   BMI 29.97 kg/m  General: Well Developed, well nourished, and in no acute distress.   MSK: Left ankle swollen and bruised.  Tender palpation lateral ankle.  Pulses are intact.  Stability not tested.    Lab and Radiology Results  X-ray images left ankle obtained today personally and independently interpreted showing mildly displaced distal fibular fracture. Await formal radiology review.     Assessment and Plan: 73 y.o. male with left ankle fracture ongoing for about 1 week.  He has minimal displacement of the distal fibula/lateral malleolus.  He has been walking on this ankle fracture for a week which is pretty impressive.  His fracture is displaced enough that surgery could be considered.  However we talked about it he very much would like to avoid surgery.  Will treat conservatively using a cam walker boot and limited weightbearing using a walker at home.  Plan to recheck in 1 week.  He will have access to a walker as he has a neighbor that can lend him an old walker.  If he needs to get a new one he should be able to buy 1 cheaply at [facility] medical supply.   PDMP not reviewed this encounter. No orders of the defined types were placed in this encounter.  No orders of the defined types were placed in this encounter.    Discussed warning signs or symptoms. Please see discharge  instructions. Patient expresses understanding.   The above documentation has been reviewed and is accurate and complete [name], M.D.

## 2024-06-22 NOTE — Assessment & Plan Note (Signed)
 Sounds regular on exam. Last episode unknown. Taking aspirin  daily and not on beta blocker due to prior bradycardia.

## 2024-06-22 NOTE — Patient Instructions (Signed)
 Thank you for coming in today.   Please go to [facility] supply to get the walker we talked about today. You may also be able to get it from [employer].    Check back in 1 weeks

## 2024-06-22 NOTE — Assessment & Plan Note (Signed)
 Pain after recent fall and difficulty ambulating. Checking x-ray foot and ankle left today. Treat as appropriate. Rx tramadol  50 mg TID prn for pain.

## 2024-06-22 NOTE — Progress Notes (Signed)
   Subjective:   Patient ID: Jeremy King, male    DOB: [birth year], 73 y.o.   MRN: [id number]  The patient is here for physical. Pertinent topics discussed: Discussed the use of AI scribe software for clinical note transcription with the patient, who gave verbal consent to proceed.  History of Present Illness Jeremy King is a 73 year old male who presents with a sprained ankle and back pain following a fall.  He experienced a fall last Tuesday while standing in the kitchen, resulting in a sprained ankle and back pain. The ankle is bruised and swollen, with pain primarily located in the ankle area. He has a history of a previous injury to the other ankle and a broken leg, which he believes has weakened his current injured ankle. He is currently not driving and is hobbling around at home as needed.  He also has pain in the rib cage area of his back, which he attributes to hitting something during the fall. He has been using a heat pad on his back to manage the pain.   PMH, [facility], social history reviewed and updated  Review of Systems  Constitutional: Negative.   HENT: Negative.    Eyes: Negative.   Respiratory:  Negative for cough, chest tightness and shortness of breath.   Cardiovascular:  Negative for chest pain, palpitations and leg swelling.  Gastrointestinal:  Negative for abdominal distention, abdominal pain, constipation, diarrhea, nausea and vomiting.  Musculoskeletal:  Positive for arthralgias and gait problem.  Skin: Negative.   Psychiatric/Behavioral: Negative.      Objective:  Physical Exam Constitutional:      Appearance: He is well-developed.  HENT:     Head: Normocephalic and atraumatic.  Cardiovascular:     Rate and Rhythm: Normal rate and regular rhythm.  Pulmonary:     Effort: Pulmonary effort is normal. No respiratory distress.     Breath sounds: Normal breath sounds. No wheezing or rales.  Abdominal:     General: Bowel sounds are normal. There is no  distension.     Palpations: Abdomen is soft.     Tenderness: There is no abdominal tenderness.  Musculoskeletal:        General: Swelling and tenderness present.     Cervical back: Normal range of motion.     Comments: Left ankle with pain and swelling and bruising  Skin:    General: Skin is warm and dry.  Neurological:     Mental Status: He is alert and oriented to person, place, and time.     Coordination: Coordination normal.     Vitals:   06/22/24 1413  BP: 112/80  Pulse: 87  Temp: 98.1 F (36.7 C)  TempSrc: Oral  SpO2: 97%  Height: 6' (1.829 m)    Assessment & Plan:

## 2024-06-22 NOTE — Assessment & Plan Note (Signed)
 Stable with effexor  and clonazepam . Refilled both and continue.

## 2024-06-22 NOTE — Assessment & Plan Note (Signed)
 No flare today and taking lipitor and aspirin .

## 2024-06-22 NOTE — Assessment & Plan Note (Signed)
 Flu shot yearly. Pneumonia complete. Shingrix due at pharmacy. Tetanus up to date. Cologuard up to date. Counseled about sun safety and mole surveillance. Counseled about the dangers of distracted driving. Given 10 year screening recommendations.

## 2024-06-28 ENCOUNTER — Ambulatory Visit: Payer: Self-pay | Admitting: Internal Medicine

## 2024-06-29 ENCOUNTER — Encounter: Payer: Self-pay | Admitting: Family Medicine

## 2024-06-29 ENCOUNTER — Ambulatory Visit (INDEPENDENT_AMBULATORY_CARE_PROVIDER_SITE_OTHER)

## 2024-06-29 ENCOUNTER — Ambulatory Visit (INDEPENDENT_AMBULATORY_CARE_PROVIDER_SITE_OTHER): Admitting: Family Medicine

## 2024-06-29 VITALS — BP 92/60 | HR 79 | Ht 72.0 in

## 2024-06-29 DIAGNOSIS — M7732 Calcaneal spur, left foot: Secondary | ICD-10-CM | POA: Diagnosis not present

## 2024-06-29 DIAGNOSIS — S8262XA Displaced fracture of lateral malleolus of left fibula, initial encounter for closed fracture: Secondary | ICD-10-CM | POA: Diagnosis not present

## 2024-06-29 DIAGNOSIS — M7662 Achilles tendinitis, left leg: Secondary | ICD-10-CM | POA: Diagnosis not present

## 2024-06-29 DIAGNOSIS — S82402D Unspecified fracture of shaft of left fibula, subsequent encounter for closed fracture with routine healing: Secondary | ICD-10-CM | POA: Diagnosis not present

## 2024-06-29 DIAGNOSIS — S8263XD Displaced fracture of lateral malleolus of unspecified fibula, subsequent encounter for closed fracture with routine healing: Secondary | ICD-10-CM | POA: Insufficient documentation

## 2024-06-29 DIAGNOSIS — S8262XD Displaced fracture of lateral malleolus of left fibula, subsequent encounter for closed fracture with routine healing: Secondary | ICD-10-CM

## 2024-06-29 NOTE — Progress Notes (Signed)
   I, Leotis Batter, CMA acting as a scribe for [name], MD.  Jeremy King is a 73 y.o. male who presents to [facility] at [facility] today for re-eval of  Left ankle fracture. Repeat XR done today. Pt compliant with boot. Note that is feels better when elevated, has more throbbing pain when the leg hangs down.   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   Exam:  BP 92/60   Pulse 79   Ht 6' (1.829 m)   SpO2 93%   BMI 29.97 kg/m  General: Well Developed, well nourished, and in no acute distress.   MSK: Left ankle bruising.  Mobility and stability not tested.    Lab and Radiology Results  X-ray images left ankle obtained today personally and independently interpreted. Fracture distal fibula is still present with no significant change of angulation or displacement. Await formal radiology review    Assessment and Plan: 74 y.o. male with left ankle fracture stable on x-ray.  Plan to continue limited weightbearing with cam walker boot and recheck in 2 weeks.   PDMP not reviewed this encounter. Orders Placed This Encounter  Procedures   DG Ankle Complete Left    Standing Status:   Future    Number of Occurrences:   1    Expiration Date:   06/29/2025    Reason for Exam (SYMPTOM  OR DIAGNOSIS REQUIRED):   ankle fracture    Preferred imaging location?:   [facility]   No orders of the defined types were placed in this encounter.    Discussed warning signs or symptoms. Please see discharge instructions. Patient expresses understanding.   The above documentation has been reviewed and is accurate and complete [name], M.D.

## 2024-06-29 NOTE — Patient Instructions (Signed)
 Thank you for coming in today.   Schedule a follow up visit for 2 weeks

## 2024-06-30 NOTE — Progress Notes (Signed)
 Called and lvm for patient and there were no questions or concerns

## 2024-07-05 ENCOUNTER — Encounter: Payer: Self-pay | Admitting: Internal Medicine

## 2024-07-05 ENCOUNTER — Ambulatory Visit: Payer: Self-pay | Admitting: Family Medicine

## 2024-07-05 NOTE — Progress Notes (Signed)
 Left ankle x-ray shows a fracture.  The radiologist thinks it looks a tiny bit worse than it did previously.  We are going to check it again in 2 weeks.

## 2024-07-05 NOTE — Progress Notes (Signed)
 Pharmacy Quality Measure Review  This patient is appearing on a report for being at risk of failing the adherence measure for cholesterol (statin) medications this calendar year.   Medication: Atorvastatin  40mg  Last fill date: 09/02 for 90 day supply  Insurance report was not up to date. No action needed at this time.  LFTs elevated in August, Atorvastatin  40mg  not renewed as a result, ultrasound and GI referral planned. No intervention indicated at this time while testing is planned.  Angela Baalmann, PharmD [facility] Pharmacist

## 2024-07-12 NOTE — Progress Notes (Unsigned)
   LILLETTE Ileana Collet, PhD, LAT, ATC acting as a scribe for [name], MD.  Jeremy King is a 73 y.o. male who presents to [facility] at [facility] today for f/u L distal fibular fx. Pt was last seen by Dr. Lloyd on 06/29/24 and was advised to cont limited weightbearing w/ CAM walker boot.  Today, pt reports ***  Dx imaging: 06/29/24 L ankle XR  06/22/24 L ankle & L foot XR  Pertinent review of systems: ***  Relevant historical information: ***   Exam:  There were no vitals taken for this visit. General: Well Developed, well nourished, and in no acute distress.   MSK: ***    Lab and Radiology Results No results found for this or any previous visit (from the past 72 hours). No results found.     Assessment and Plan: 73 y.o. male with ***   PDMP not reviewed this encounter. No orders of the defined types were placed in this encounter.  No orders of the defined types were placed in this encounter.    Discussed warning signs or symptoms. Please see discharge instructions. Patient expresses understanding.   ***

## 2024-07-13 ENCOUNTER — Ambulatory Visit (INDEPENDENT_AMBULATORY_CARE_PROVIDER_SITE_OTHER)

## 2024-07-13 ENCOUNTER — Ambulatory Visit (INDEPENDENT_AMBULATORY_CARE_PROVIDER_SITE_OTHER): Admitting: Family Medicine

## 2024-07-13 ENCOUNTER — Encounter: Payer: Self-pay | Admitting: Family Medicine

## 2024-07-13 VITALS — BP 90/62 | HR 68 | Ht 72.0 in

## 2024-07-13 DIAGNOSIS — S8262XD Displaced fracture of lateral malleolus of left fibula, subsequent encounter for closed fracture with routine healing: Secondary | ICD-10-CM | POA: Diagnosis not present

## 2024-07-13 DIAGNOSIS — S82432D Displaced oblique fracture of shaft of left fibula, subsequent encounter for closed fracture with routine healing: Secondary | ICD-10-CM | POA: Diagnosis not present

## 2024-07-13 NOTE — Patient Instructions (Signed)
 Thank you for coming in today.   Return in 1 month.   Please use the cam walker boot when up and walking.

## 2024-07-19 ENCOUNTER — Ambulatory Visit: Payer: Self-pay | Admitting: Family Medicine

## 2024-07-19 NOTE — Progress Notes (Signed)
 Left ankle x-ray shows fracture like we talked about in clinic.

## 2024-07-22 ENCOUNTER — Encounter: Payer: Self-pay | Admitting: Family Medicine

## 2024-07-25 ENCOUNTER — Other Ambulatory Visit: Payer: Self-pay | Admitting: Internal Medicine

## 2024-07-26 ENCOUNTER — Other Ambulatory Visit: Payer: Self-pay | Admitting: Internal Medicine

## 2024-07-26 NOTE — Telephone Encounter (Unsigned)
 Copied from CRM #[id number]. Topic: Clinical - Medication Refill >> Jul 26, 2024  3:11 PM Mercedes MATSU wrote: Medication: clonazePAM  (KLONOPIN ) 1 MG tablet  Has the patient contacted their pharmacy? Yes (Agent: If no, request that the patient contact the pharmacy for the refill. If patient does not wish to contact the pharmacy document the reason why and proceed with request.) (Agent: If yes, when and what did the pharmacy advise?)  This is the patient's preferred pharmacy:  [id number] [place], [place] - [address] AT [facility] & [address] [place] [address] Phone: [phone] Fax: [phone]  Is this the correct pharmacy for this prescription? Yes If no, delete pharmacy and type the correct one.   Has the prescription been filled recently? Yes  Is the patient out of the medication? Yes  Has the patient been seen for an appointment in the last year OR does the patient have an upcoming appointment? Yes  Can we respond through MyChart? Yes  Agent: Please be advised that Rx refills may take up to 3 business days. We ask that you follow-up with your pharmacy.

## 2024-07-28 ENCOUNTER — Encounter: Payer: Self-pay | Admitting: Internal Medicine

## 2024-07-28 MED ORDER — CLONAZEPAM 1 MG PO TABS: 1.0000 mg | ORAL_TABLET | Freq: Two times a day (BID) | ORAL | 1 refills | Status: AC

## 2024-07-28 NOTE — Telephone Encounter (Signed)
 It look like this was refused two days ago I'm not sure as to why he had his annual please advise as Md is out of office

## 2024-08-10 ENCOUNTER — Other Ambulatory Visit: Payer: Self-pay | Admitting: Internal Medicine

## 2024-08-16 ENCOUNTER — Ambulatory Visit: Admitting: Family Medicine

## 2024-08-16 ENCOUNTER — Ambulatory Visit

## 2024-08-16 ENCOUNTER — Other Ambulatory Visit: Payer: Self-pay

## 2024-08-16 VITALS — BP 98/64 | HR 74 | Ht 72.0 in

## 2024-08-16 DIAGNOSIS — S8262XD Displaced fracture of lateral malleolus of left fibula, subsequent encounter for closed fracture with routine healing: Secondary | ICD-10-CM | POA: Diagnosis not present

## 2024-08-16 NOTE — Patient Instructions (Signed)
 Thank you for coming in today.   Recheck in 1 month.   OK to let that foot move at home.

## 2024-08-16 NOTE — Progress Notes (Unsigned)
   I, Jeremy King, CMA acting as a scribe for Jeremy Lloyd, MD.  Jeremy King is a 73 y.o. male who presents to [facility] at [facility] today for L distal fibular fracture. Pt was last seen by Dr. Lloyd on 07/13/24 for this, noted some continued bruising and pain when ambulating without the boot. Repeat XR ordered of the left ankle, unchanged. Pt advise to continue CAM [employer].   Today, patient reports that he feels like he is doing well since last visit. No pain. Patient has been diligent with wearing tall cam walker. States that he wonders if low BP is the cause of his fall. Does take BP at home and states that it is lower.  States that [place] has documented lower BP readings recently.    Pertinent review of systems: No fevers or chills  Relevant historical information: Atrial fibrillation heart disease   Exam:  BP 98/64   Pulse 74   Ht 6' (1.829 m)   SpO2 92%   BMI 29.97 kg/m  General: Well Developed, well nourished, and in no acute distress.   MSK: Left ankle some swelling present.  Decreased range of motion    Lab and Radiology Results  X-ray images left ankle obtained today personally and independently interpreted. Mildly displaced oblique distal fibular fracture.  No change in angulation or displacement.  Some callus formation is present around fracture. Await formal radiology review.   Assessment and Plan: 73 y.o. male with healing left ankle fracture.  Plan to continue the boot with typical activity however when home and resting he can take the boot off and start moving his foot around.  Recheck in a month   PDMP not reviewed this encounter. Orders Placed This Encounter  Procedures   US  LIMITED JOINT SPACE STRUCTURES LOW LEFT(NO LINKED CHARGES)    Reason for Exam (SYMPTOM  OR DIAGNOSIS REQUIRED):   left ankle pain    Preferred imaging location?:   [facility] Ankle Complete Left    Standing Status:   Future     Number of Occurrences:   1    Expiration Date:   08/16/2025    Reason for Exam (SYMPTOM  OR DIAGNOSIS REQUIRED):   left ankle pain / fracture    Preferred imaging location?:   [facility]   No orders of the defined types were placed in this encounter.    Discussed warning signs or symptoms. Please see discharge instructions. Patient expresses understanding.   The above documentation has been reviewed and is accurate and complete Jeremy King, M.D.

## 2024-08-19 ENCOUNTER — Ambulatory Visit: Payer: Self-pay | Admitting: Family Medicine

## 2024-08-19 NOTE — Progress Notes (Signed)
 Left ankle xray shows a healing fracture

## 2024-09-01 ENCOUNTER — Other Ambulatory Visit: Payer: Self-pay | Admitting: Internal Medicine

## 2024-09-20 ENCOUNTER — Ambulatory Visit

## 2024-09-20 ENCOUNTER — Encounter: Payer: Self-pay | Admitting: Family Medicine

## 2024-09-20 ENCOUNTER — Ambulatory Visit: Admitting: Family Medicine

## 2024-09-20 VITALS — BP 110/74 | HR 101 | Ht 72.0 in | Wt 198.0 lb

## 2024-09-20 DIAGNOSIS — S8262XD Displaced fracture of lateral malleolus of left fibula, subsequent encounter for closed fracture with routine healing: Secondary | ICD-10-CM

## 2024-09-20 NOTE — Patient Instructions (Addendum)
 Thank you for coming in today.   Continue activity as tolerated.   Check back in 2 months.

## 2024-09-20 NOTE — Progress Notes (Signed)
° °  I, Leotis Batter, CMA acting as a scribe for [name], MD.  Jeremy King is a 73 y.o. male who presents to [facility] at [facility] today for f/u L distal fibular fx. Pt was last seen by Dr. Lloyd on 08/16/24 and was advised to cont the boot w/ activity, but w/ rest can take it off.  Today, pt reports that things have been going well since last visit. Has been out of the boot while at home, wears the boot when he goes out. No pain. Denies visible swelling. Denies new injury.   Dx imaging: 06/29/24 L ankle XR  06/22/24 L ankle & L foot XR  Pertinent review of systems: No fevers or chills  Relevant historical information: Heart failure smoker coronary artery disease atrial fibrillation.   Exam:  BP 110/74   Pulse (!) 101   Ht 6' (1.829 m)   Wt 198 lb (89.8 kg)   PF 98 L/min   BMI 26.85 kg/m  General: Well Developed, well nourished, and in no acute distress.   MSK: Left ankle normal appearing Not particularly tender to palpation.  Normal motion.  Intact strength.    Lab and Radiology Results  X-ray images left ankle obtained today personally and independently interpreted. Remaining fracture line is still visible on x-ray of the lateral malleolus and distal fibula.  Some callus formation is present.  No change in angulation or displacement. Await formal radiology review.   Assessment and Plan: 73 y.o. male with left ankle fracture ongoing slightly greater than 3 months.  Clinically he is doing extremely well.  He still has some healing to do on x-ray.  Plan to continue activity as tolerated and recheck in 2 months.  If fracture line is still visible easily in 2 months consider bone stimulator.   PDMP not reviewed this encounter. Orders Placed This Encounter  Procedures   DG Ankle Complete Left    Standing Status:   Future    Number of Occurrences:   1    Expiration Date:   09/20/2025    Reason for Exam (SYMPTOM  OR DIAGNOSIS REQUIRED):   left  ankle fx    Preferred imaging location?:   [facility]   No orders of the defined types were placed in this encounter.    Discussed warning signs or symptoms. Please see discharge instructions. Patient expresses understanding.   The above documentation has been reviewed and is accurate and complete [name], M.D.

## 2024-10-07 ENCOUNTER — Ambulatory Visit: Payer: Self-pay | Admitting: Family Medicine

## 2024-10-07 NOTE — Progress Notes (Signed)
 Left ankle x-ray shows slowly healing broken bone.

## 2024-10-21 ENCOUNTER — Ambulatory Visit

## 2024-10-21 ENCOUNTER — Ambulatory Visit: Admitting: Family Medicine

## 2024-10-21 VITALS — BP 94/62 | HR 84 | Ht 72.0 in

## 2024-10-21 DIAGNOSIS — M25571 Pain in right ankle and joints of right foot: Secondary | ICD-10-CM

## 2024-10-21 DIAGNOSIS — G8929 Other chronic pain: Secondary | ICD-10-CM

## 2024-10-21 DIAGNOSIS — S82891A Other fracture of right lower leg, initial encounter for closed fracture: Secondary | ICD-10-CM | POA: Diagnosis not present

## 2024-10-21 DIAGNOSIS — S8262XD Displaced fracture of lateral malleolus of left fibula, subsequent encounter for closed fracture with routine healing: Secondary | ICD-10-CM

## 2024-10-21 NOTE — Patient Instructions (Addendum)
 Thank you for coming in today.   Use the boot on your RIGHT side  Check back in 2-wks.

## 2024-10-21 NOTE — Progress Notes (Signed)
"       ° °  LILLETTE Ileana Collet, PhD, LAT, ATC acting as a scribe for [name], MD.  Jeremy King is a 74 y.o. male who presents to [facility] at [facility] today for 33-month f/u L distal fibular fx. Pt was last seen by Dr. Lloyd on 09/20/24 and was advised to cont to advance activity, as tolerated.  Today, pt reports he rolled his R ankle on Saturday. +swelling and bruised. This morning he suffered a fall, causing some soreness in his L ankle pain. He notes difficulty w/ his hydration. Lac on the R side of his face, along the R eyebrow.  Dx imaging: 06/29/24 L ankle XR             06/22/24 L ankle & L foot XR  Pertinent review of systems: No fevers or chills  Relevant historical information: Atrial fibrillation.  Left ankle fracture.   Exam:  BP 94/62   Pulse 84   Ht 6' (1.829 m)   SpO2 94%   BMI 26.85 kg/m  General: Well Developed, well nourished, and in no acute distress.   MSK: Left ankle is normal-appearing and nontender.  Normal motion.  Right ankle swollen and tender palpation at lateral malleolus.  Limited motion with pain.     X-ray images bilateral ankle obtained today personally and independently interpreted.  Right ankle: Acute nondisplaced fracture of the lateral malleolus occurring in the setting of a healed old proximal fibular fracture.  Left ankle: Mostly healed left lateral malleolus fracture.  Await formal radiology review     Assessment and Plan: 74 y.o. male with left ankle fracture.  This is a slowly healing chronic fracture.  Clinically he is doing quite well.  Unfortunately today he has a new fracture of the right ankle.  This is acute and the major problem currently.  Plan to use a cam walker boot on the right side and recheck in 2 weeks.   PDMP not reviewed this encounter. Orders Placed This Encounter  Procedures   DG Ankle Complete Left    Standing Status:   Future    Number of Occurrences:   1    Expiration Date:    10/21/2025    Reason for Exam (SYMPTOM  OR DIAGNOSIS REQUIRED):   left ankle pain    Preferred imaging location?:   [facility] Ankle Complete Right    Standing Status:   Future    Number of Occurrences:   1    Expiration Date:   10/21/2025    Reason for Exam (SYMPTOM  OR DIAGNOSIS REQUIRED):   right ankle pain    Preferred imaging location?:   [facility]   No orders of the defined types were placed in this encounter.    Discussed warning signs or symptoms. Please see discharge instructions. Patient expresses understanding.   The above documentation has been reviewed and is accurate and complete [name], M.D.   "

## 2024-10-28 ENCOUNTER — Ambulatory Visit: Payer: Self-pay | Admitting: Family Medicine

## 2024-10-28 NOTE — Progress Notes (Signed)
 Left ankle xray shows a healing fracture

## 2024-10-28 NOTE — Progress Notes (Signed)
 Right ankle x-ray shows a new fracture like we talked about.

## 2024-10-30 ENCOUNTER — Other Ambulatory Visit: Payer: Self-pay | Admitting: Internal Medicine

## 2024-11-04 ENCOUNTER — Ambulatory Visit: Admitting: Family Medicine

## 2024-11-04 ENCOUNTER — Ambulatory Visit

## 2024-11-04 VITALS — BP 106/66 | HR 105 | Ht 72.0 in

## 2024-11-04 DIAGNOSIS — S82891D Other fracture of right lower leg, subsequent encounter for closed fracture with routine healing: Secondary | ICD-10-CM

## 2024-11-04 DIAGNOSIS — S82891A Other fracture of right lower leg, initial encounter for closed fracture: Secondary | ICD-10-CM | POA: Diagnosis not present

## 2024-11-04 NOTE — Patient Instructions (Signed)
Thank you for coming in today.   Check back in 2 weeks 

## 2024-11-04 NOTE — Progress Notes (Signed)
"       ° °  LILLETTE Ileana Collet, PhD, LAT, ATC acting as a scribe for [name], MD.  Jeremy King is a 74 y.o. male who presents to [facility] at [facility] today for 2-wk f/u L lateral malleolar fx. Pt was last seen by Dr. Lloyd on 10/21/24 and was advised to wear a CAM walker boot on his R side.   Today, pt reports R ankle is feeling pretty good. He doesn't wear the boot in the house or when he drives. Mostly pain w/ activities when he's out and about  Dx imaging: 06/29/24 L ankle XR             06/22/24 L ankle & L foot XR   Pertinent review of systems: No fevers or chills  Relevant historical information: Heart disease   Exam:  BP 106/66   Pulse (!) 105   Ht 6' (1.829 m)   SpO2 97%   BMI 26.85 kg/m  General: Well Developed, well nourished, and in no acute distress.   MSK: Right ankle mild effusion tender palpation mildly at lateral malleolus.  Decreased range of motion    Lab and Radiology Results  X-ray images right ankle obtained today personally and independently interpreted. Minimally displaced lateral malleolus fracture.  No change in angulation or displacement from prior x-ray.  Periosteal reabsorption is present. Await formal radiology review.    Assessment and Plan: 74 y.o. male with right ankle fracture.  Plan to continue immobilization and limited weightbearing and recheck in 2 weeks.   PDMP not reviewed this encounter. Orders Placed This Encounter  Procedures   DG Ankle Complete Right    Standing Status:   Future    Number of Occurrences:   1    Expiration Date:   11/04/2025    Reason for Exam (SYMPTOM  OR DIAGNOSIS REQUIRED):   right ankle pain    Preferred imaging location?:   [facility]   No orders of the defined types were placed in this encounter.    Discussed warning signs or symptoms. Please see discharge instructions. Patient expresses understanding.   The above documentation has been reviewed and is accurate and  complete [name], M.D.   "

## 2024-11-09 ENCOUNTER — Ambulatory Visit: Payer: Self-pay | Admitting: Family Medicine

## 2024-11-09 NOTE — Progress Notes (Signed)
 Right ankle x-ray shows a unchanged ankle fracture.  This is good news is not shifting out of position.

## 2024-11-18 ENCOUNTER — Ambulatory Visit: Admitting: Family Medicine

## 2025-06-02 ENCOUNTER — Ambulatory Visit
